# Patient Record
Sex: Male | Born: 1945 | Race: White | Hispanic: No | Marital: Married | Smoking: Never smoker
Health system: Southern US, Community
[De-identification: ages and names within clinical notes are randomized; demographics above are authoritative.]

## PROBLEM LIST (undated history)

## (undated) DIAGNOSIS — E119 Type 2 diabetes mellitus without complications: Secondary | ICD-10-CM

## (undated) DIAGNOSIS — Z87442 Personal history of urinary calculi: Secondary | ICD-10-CM

## (undated) DIAGNOSIS — E785 Hyperlipidemia, unspecified: Secondary | ICD-10-CM

## (undated) DIAGNOSIS — I10 Essential (primary) hypertension: Secondary | ICD-10-CM

## (undated) HISTORY — DX: Personal history of urinary calculi: Z87.442

## (undated) HISTORY — DX: Essential (primary) hypertension: I10

## (undated) HISTORY — PX: KNEE SURGERY: SHX244

## (undated) HISTORY — DX: Type 2 diabetes mellitus without complications: E11.9

## (undated) HISTORY — DX: Hyperlipidemia, unspecified: E78.5

---

## 2005-10-01 ENCOUNTER — Ambulatory Visit: Payer: Self-pay

## 2007-04-11 IMAGING — CT CT STONE STUDY
1 of 2 series · 15 of 32 positions shown, 19 images · non-contrast
Comparison: none

REASON FOR EXAM: LEFT FLANK PAIN
COMMENTS:

[Series 2: soft tissue · axial · 0.71mm/px · z∈[-1442,-1022]mm · 15 of 158 slices shown, 19 images]
[im 12/158  soft-tissue]
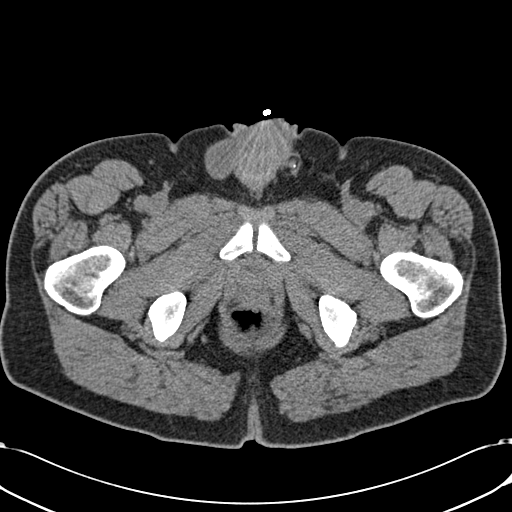
[im 12/158  bone]
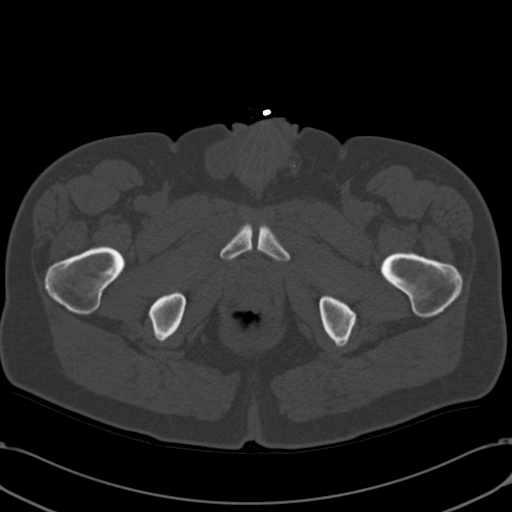
[im 23/158  soft-tissue]
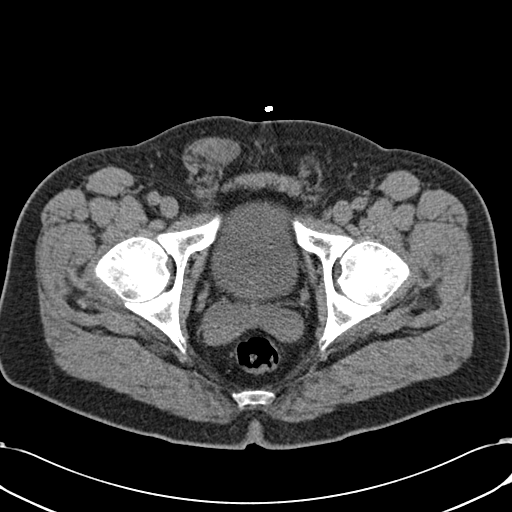
[im 34/158  soft-tissue]
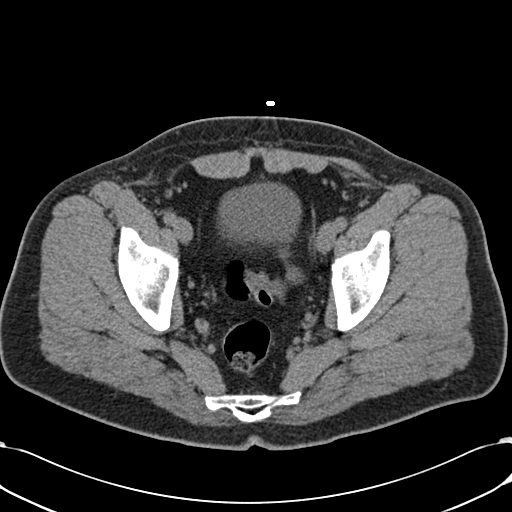
[im 45/158  soft-tissue]
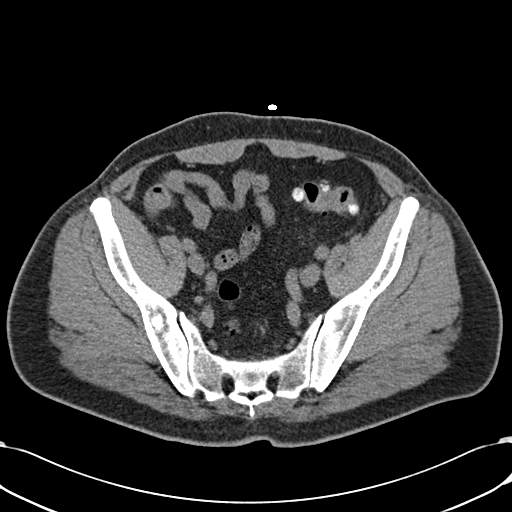
[im 57/158  soft-tissue]
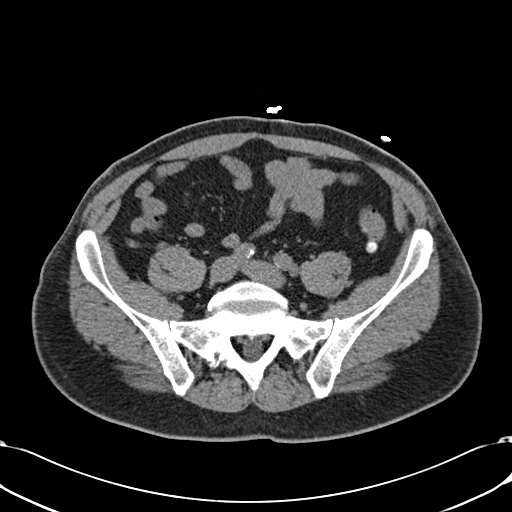
[im 68/158  soft-tissue]
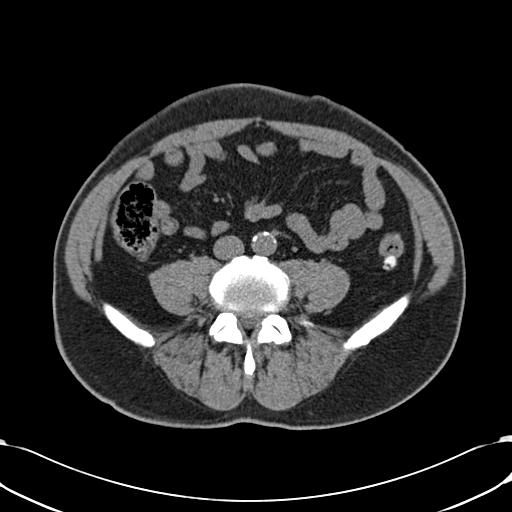
[im 79/158  soft-tissue]
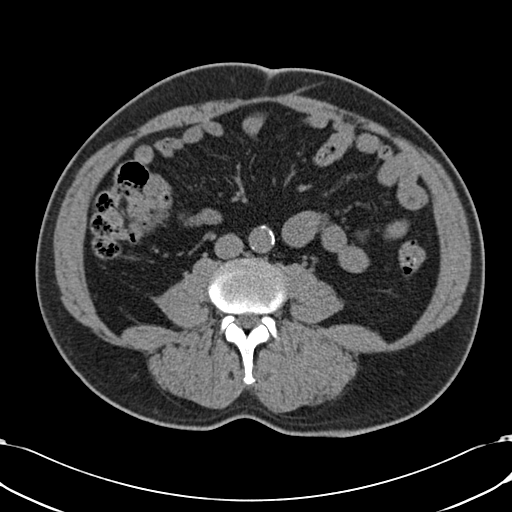
[im 90/158  soft-tissue]
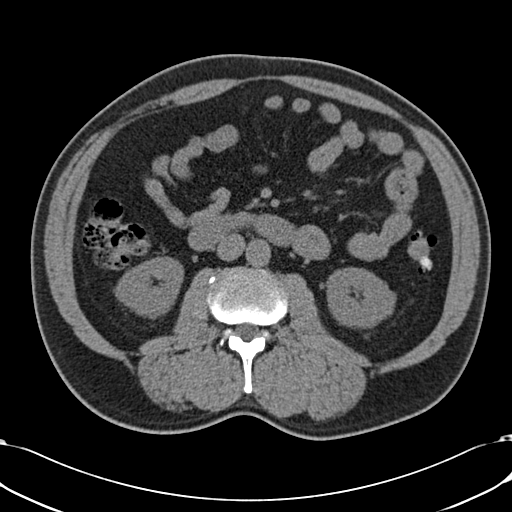
[im 101/158  soft-tissue]
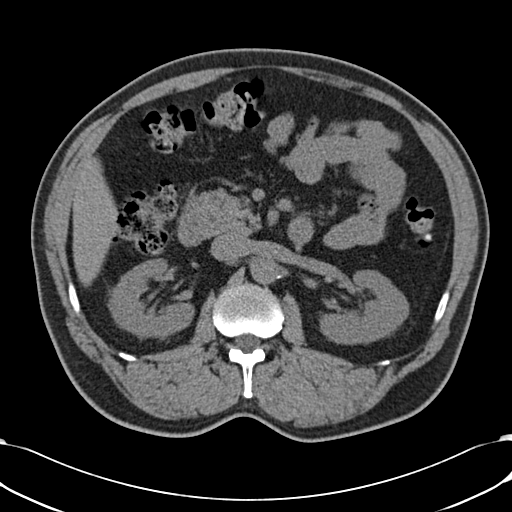
[im 101/158  bone]
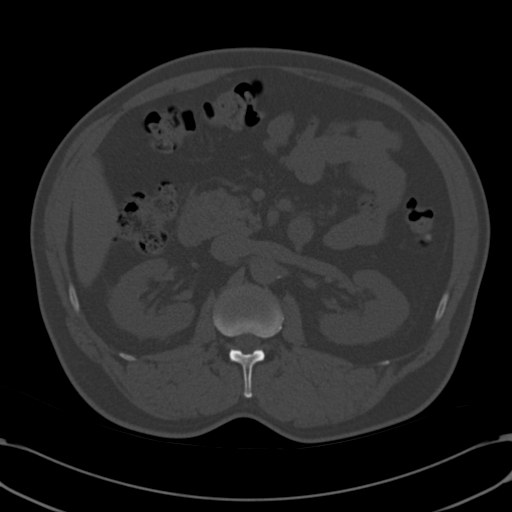
[im 113/158  soft-tissue]
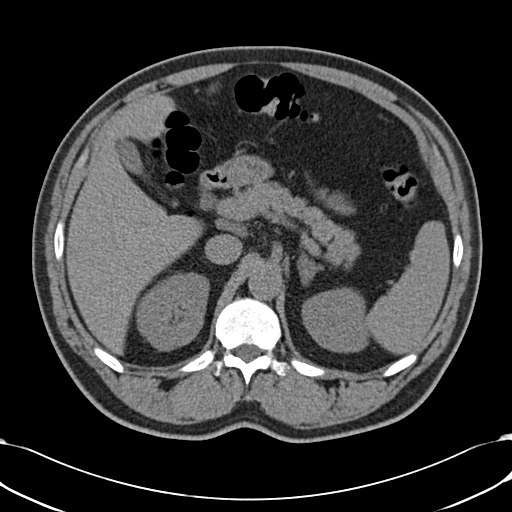
[im 124/158  soft-tissue]
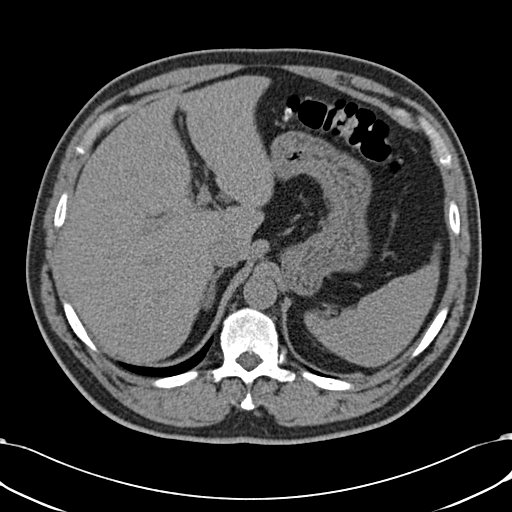
[im 135/158  soft-tissue]
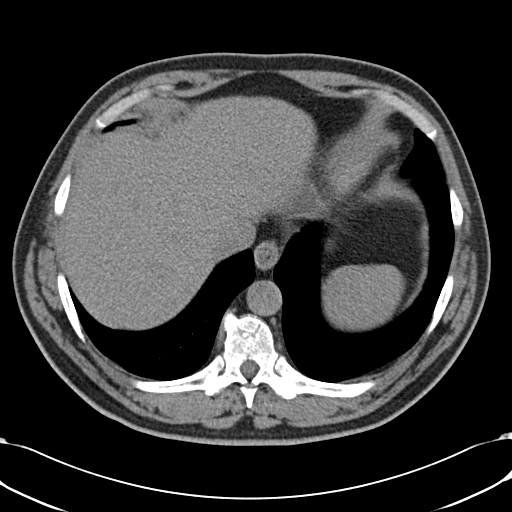
[im 135/158  lung]
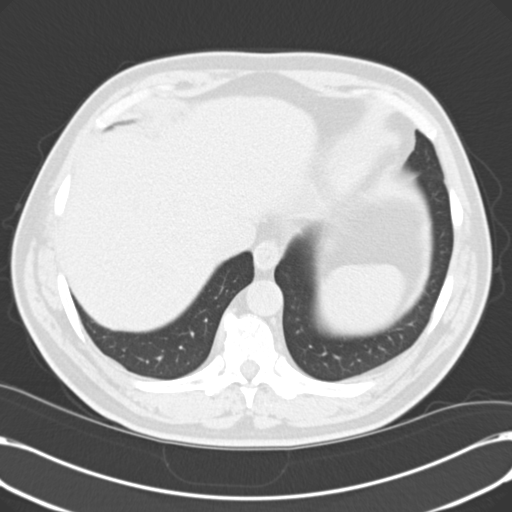
[im 141/158  lung]
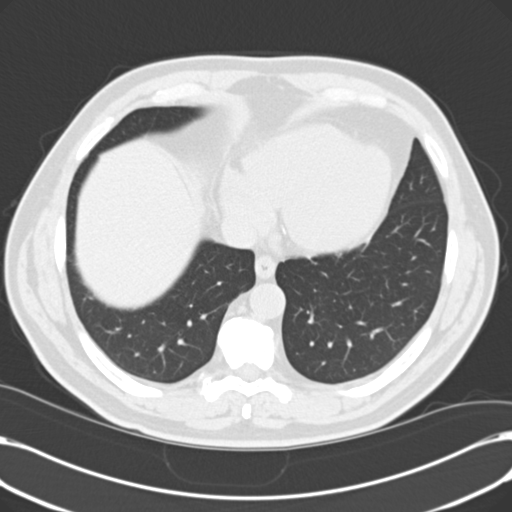
[im 146/158  soft-tissue]
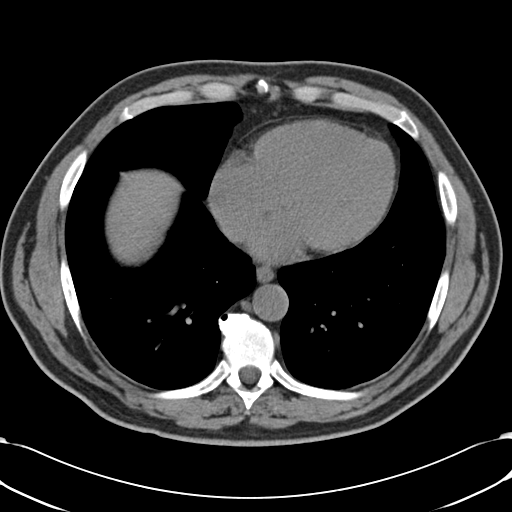
[im 146/158  lung]
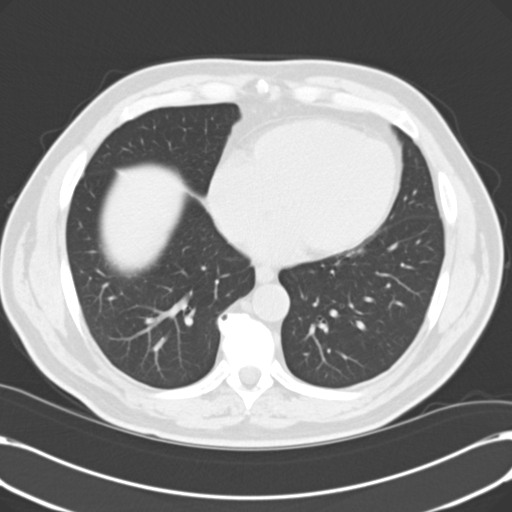
[im 152/158  lung]
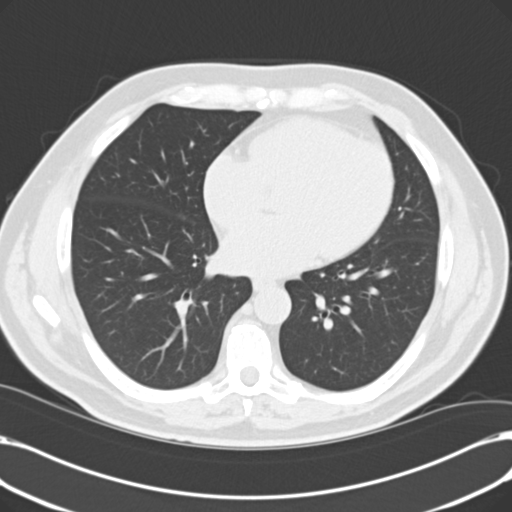

[15 of 32 positions shown; findings below may reference images not displayed]

PROCEDURE:     CT  - CT ABDOMEN /PELVIS WO (STONE)  - October 01, 2005  [DATE]

RESULT:     The patient is complaining of lower back and LEFT flank
discomfort.

The study was performed without oral or intravenous contrast material.

The kidneys are normal in contour.  The perinephric fat is normal in
appearance.  I do not see evidence of hydronephrosis or evidence of
calcified stones.  The ureters are normal in course and caliber.  There is
no evidence of ureteropelvic junction stone.  The urinary bladder is
adequately distended and grossly normal.  The prostate gland produces a
moderate impression upon the urinary bladder base.  There is an inguinal
hernia on the RIGHT containing fat and some fluid but no evidence of bowel
loop is seen.  In fact this may reflect more a hydrocele than a classic
hernia.  There is no free fluid in the pelvis.  There is sigmoid
diverticulosis.  I see no evidence of pelvic lymphadenopathy.  There are
diverticula associated with the descending colon.  There are no findings
suspicious for acute diverticulitis however.

The liver, spleen, nondistended stomach, pancreas, and adrenal glands
exhibit no acute abnormality.  The gallbladder is contracted.  There is no
evidence of ascites.  I see no definite gallstones.  The caliber of the
abdominal aorta is within the limits of normal.  The unopacified loops of
small bowel are normal in appearance.  The lung bases are clear.
IMPRESSION: I do not see evidence of urinary tract stones or urinary tract obstruction
or inflammatory changes.

There is descending colon and rectosigmoid colon diverticulosis without
objective evidence of acute diverticulitis.

There are findings that suggest an inguinal hernia on the RIGHT versus a
hydrocele.

There is no evidence of an abdominal aortic aneurysm or of a retroperitoneal
mass.

I see no more than mild degenerative-type change of the lumbar spine.

## 2010-09-10 ENCOUNTER — Ambulatory Visit: Payer: Self-pay | Admitting: Family Medicine

## 2015-03-28 ENCOUNTER — Other Ambulatory Visit: Payer: Self-pay | Admitting: Family Medicine

## 2015-03-28 ENCOUNTER — Telehealth: Payer: Self-pay

## 2015-03-28 MED ORDER — LANSOPRAZOLE 30 MG PO CPDR: 30.0000 mg | DELAYED_RELEASE_CAPSULE | Freq: Every day | ORAL | Status: DC

## 2015-03-28 MED ORDER — CLORAZEPATE DIPOTASSIUM 7.5 MG PO TABS: 7.5000 mg | ORAL_TABLET | Freq: Two times a day (BID) | ORAL | Status: DC

## 2015-03-28 MED ORDER — ATORVASTATIN CALCIUM 20 MG PO TABS: 20.0000 mg | ORAL_TABLET | Freq: Every day | ORAL | Status: DC

## 2015-03-28 MED ORDER — ATENOLOL 25 MG PO TABS: 25.0000 mg | ORAL_TABLET | Freq: Two times a day (BID) | ORAL | Status: DC

## 2015-03-28 MED ORDER — BENAZEPRIL HCL 40 MG PO TABS: 40.0000 mg | ORAL_TABLET | Freq: Every day | ORAL | Status: DC

## 2015-03-28 MED ORDER — FINASTERIDE 5 MG PO TABS: 5.0000 mg | ORAL_TABLET | Freq: Every day | ORAL | Status: DC

## 2015-03-28 MED ORDER — METFORMIN HCL 500 MG PO TABS: 1000.0000 mg | ORAL_TABLET | Freq: Two times a day (BID) | ORAL | Status: DC

## 2015-03-28 MED ORDER — DOXYCYCLINE HYCLATE 100 MG PO CAPS: 100.0000 mg | ORAL_CAPSULE | Freq: Two times a day (BID) | ORAL | Status: DC

## 2015-03-28 MED ORDER — AMLODIPINE BESYLATE 10 MG PO TABS: 10.0000 mg | ORAL_TABLET | Freq: Every day | ORAL | Status: DC

## 2015-03-28 MED ORDER — TAMSULOSIN HCL 0.4 MG PO CAPS: 0.8000 mg | ORAL_CAPSULE | Freq: Every day | ORAL | Status: DC

## 2015-03-28 NOTE — Telephone Encounter (Signed)
Patient needs all of his medications refilled with [facility] - he will run out before visit

## 2015-03-29 MED ORDER — CLORAZEPATE DIPOTASSIUM 7.5 MG PO TABS: 7.5000 mg | ORAL_TABLET | Freq: Two times a day (BID) | ORAL | Status: DC

## 2015-04-03 ENCOUNTER — Ambulatory Visit (INDEPENDENT_AMBULATORY_CARE_PROVIDER_SITE_OTHER): Payer: Medicare PPO | Admitting: Family Medicine

## 2015-04-03 ENCOUNTER — Encounter: Payer: Self-pay | Admitting: Family Medicine

## 2015-04-03 VITALS — BP 146/67 | HR 52 | Temp 98.6°F | Ht 66.2 in | Wt 187.0 lb

## 2015-04-03 DIAGNOSIS — Z23 Encounter for immunization: Secondary | ICD-10-CM | POA: Diagnosis not present

## 2015-04-03 DIAGNOSIS — E119 Type 2 diabetes mellitus without complications: Secondary | ICD-10-CM | POA: Diagnosis not present

## 2015-04-03 DIAGNOSIS — I10 Essential (primary) hypertension: Secondary | ICD-10-CM | POA: Diagnosis not present

## 2015-04-03 DIAGNOSIS — E785 Hyperlipidemia, unspecified: Secondary | ICD-10-CM | POA: Diagnosis not present

## 2015-04-03 DIAGNOSIS — F419 Anxiety disorder, unspecified: Secondary | ICD-10-CM

## 2015-04-03 MED ORDER — LORAZEPAM 0.5 MG PO TABS: 0.5000 mg | ORAL_TABLET | Freq: Two times a day (BID) | ORAL | Status: DC

## 2015-04-03 NOTE — Progress Notes (Signed)
BP 146/67 mmHg  Pulse 52  Temp(Src) 98.6 F (37 C)  Ht 5' 6.2" (1.681 m)  Wt 187 lb (84.823 kg)  BMI 30.02 kg/m2  SpO2 96%   Subjective:    Patient ID: Russell Byrd, male    DOB: 02-May-1946, 68 y.o.   MRN: [id number]  HPI: Russell Byrd is a 69 y.o. male  Chief Complaint  Patient presents with  . Diabetes   Diabetes doing well no low blood sugar spells home checking showing good control no issues with medications no low blood sugar spells. Taking faithfully without problems.  Blood pressure no issues with medications taken faithfully and good control.  Hypercholesterol doing well with medications no complaints takes faithfully  Chronic anxiety concerned because Tranxene has gotten to be very expensive as wondered about less expensive medications for anxiety.  Relevant past medical, surgical, family and social history reviewed and updated as indicated. Interim medical history since our last visit reviewed. Allergies and medications reviewed and updated.  Review of Systems  Constitutional: Negative.   Respiratory: Negative.   Cardiovascular: Negative.     Per HPI unless specifically indicated above     Objective:    BP 146/67 mmHg  Pulse 52  Temp(Src) 98.6 F (37 C)  Ht 5' 6.2" (1.681 m)  Wt 187 lb (84.823 kg)  BMI 30.02 kg/m2  SpO2 96%  Wt Readings from Last 3 Encounters:  04/03/15 187 lb (84.823 kg)  09/29/14 186 lb (84.369 kg)    Physical Exam  Constitutional: He is oriented to person, place, and time. He appears well-developed and well-nourished. No distress.  HENT:  Head: Normocephalic and atraumatic.  Right Ear: Hearing normal.  Left Ear: Hearing normal.  Nose: Nose normal.  Eyes: Conjunctivae and lids are normal. Right eye exhibits no discharge. Left eye exhibits no discharge. No scleral icterus.  Cardiovascular: Normal rate, regular rhythm and normal heart sounds.   Pulmonary/Chest: Effort normal and breath sounds normal. No respiratory  distress.  Musculoskeletal: Normal range of motion.  Neurological: He is alert and oriented to person, place, and time.  Skin: Skin is intact. No rash noted.  Psychiatric: He has a normal mood and affect. His speech is normal and behavior is normal. Judgment and thought content normal. Cognition and memory are normal.    No results found for this or any previous visit.    Assessment & Plan:   Problem List Items Addressed This Visit      Cardiovascular and Mediastinum   Hypertension    The current medical regimen is effective;  continue present plan and medications.         Endocrine   Diabetes mellitus without complication - Primary    The current medical regimen is effective;  continue present plan and medications.       Relevant Orders   Bayer DCA Hb A1c Waived     Other   Hyperlipidemia    The current medical regimen is effective;  continue present plan and medications.       Chronic anxiety    Patient taking Tranxene which is gotten very expensive will switch to lorezapam 0.5 which is the equivalent dose.      Relevant Medications   LORazepam (ATIVAN) 0.5 MG tablet    Other Visit Diagnoses    Immunization due        Relevant Orders    Flu Vaccine QUAD 36+ mos PF IM (Fluarix & Fluzone Quad PF) (Completed)  Follow up plan: Return in about 6 months (around 10/01/2015), or if symptoms worsen or fail to improve, for Physical Exam.

## 2015-04-03 NOTE — Assessment & Plan Note (Signed)
The current medical regimen is effective;  continue present plan and medications.  

## 2015-04-03 NOTE — Assessment & Plan Note (Signed)
Patient taking Tranxene which is gotten very expensive will switch to lorezapam 0.5 which is the equivalent dose.

## 2015-04-04 LAB — BAYER DCA HB A1C WAIVED: HB A1C (BAYER DCA - WAIVED): 7.5 % — ABNORMAL HIGH (ref <7.0)

## 2015-06-13 ENCOUNTER — Telehealth: Payer: Self-pay

## 2015-06-13 NOTE — Telephone Encounter (Signed)
Patient's Metformin needs to be written for [name] used his refill to send current order, please send new Rx for 360 to get him caught up  I verbally called in his Lorazepam

## 2015-06-14 ENCOUNTER — Other Ambulatory Visit: Payer: Self-pay | Admitting: Family Medicine

## 2015-06-14 MED ORDER — METFORMIN HCL 500 MG PO TABS: 1000.0000 mg | ORAL_TABLET | Freq: Two times a day (BID) | ORAL | Status: DC

## 2015-08-14 LAB — HM DIABETES EYE EXAM

## 2015-08-23 ENCOUNTER — Telehealth: Payer: Self-pay | Admitting: Family Medicine

## 2015-08-23 ENCOUNTER — Other Ambulatory Visit: Payer: Self-pay | Admitting: Family Medicine

## 2015-08-23 MED ORDER — CLORAZEPATE DIPOTASSIUM 7.5 MG PO TABS: 7.5000 mg | ORAL_TABLET | Freq: Two times a day (BID) | ORAL | Status: DC | PRN

## 2015-08-23 NOTE — Telephone Encounter (Signed)
Called in.

## 2015-08-23 NOTE — Telephone Encounter (Signed)
Pt called and stated that he was having BP issues and he would like a call back. @ 3pm it was 206/74 and it was 165/104@2pm . i advised the pt to go to the ED but he stated that he just needed to speak with [facility] to change his medication. Pt would like a call back asap.

## 2015-08-23 NOTE — Progress Notes (Signed)
Discussed with patient anxiety driving blood pressure up Patient had been previously well controlled on Tranxene will restart Tranxene prescription

## 2015-09-21 ENCOUNTER — Other Ambulatory Visit: Payer: Self-pay | Admitting: Family Medicine

## 2015-09-21 MED ORDER — ATENOLOL 25 MG PO TABS: 25.0000 mg | ORAL_TABLET | Freq: Two times a day (BID) | ORAL | Status: DC

## 2015-09-21 MED ORDER — ATORVASTATIN CALCIUM 20 MG PO TABS: 20.0000 mg | ORAL_TABLET | Freq: Every day | ORAL | Status: DC

## 2015-09-21 MED ORDER — BENAZEPRIL HCL 40 MG PO TABS: 40.0000 mg | ORAL_TABLET | Freq: Every day | ORAL | Status: DC

## 2015-09-21 MED ORDER — LANSOPRAZOLE 30 MG PO CPDR: 30.0000 mg | DELAYED_RELEASE_CAPSULE | Freq: Every day | ORAL | Status: DC

## 2015-09-21 MED ORDER — AMLODIPINE BESYLATE 10 MG PO TABS: 10.0000 mg | ORAL_TABLET | Freq: Every day | ORAL | Status: DC

## 2015-09-21 MED ORDER — CLORAZEPATE DIPOTASSIUM 7.5 MG PO TABS: 7.5000 mg | ORAL_TABLET | Freq: Two times a day (BID) | ORAL | Status: DC | PRN

## 2015-09-21 MED ORDER — METFORMIN HCL 500 MG PO TABS: 1000.0000 mg | ORAL_TABLET | Freq: Two times a day (BID) | ORAL | Status: DC

## 2015-10-02 ENCOUNTER — Ambulatory Visit (INDEPENDENT_AMBULATORY_CARE_PROVIDER_SITE_OTHER): Payer: Medicare HMO | Admitting: Family Medicine

## 2015-10-02 ENCOUNTER — Encounter: Payer: Self-pay | Admitting: Family Medicine

## 2015-10-02 VITALS — BP 137/66 | HR 49 | Temp 98.4°F | Ht 65.8 in | Wt 182.0 lb

## 2015-10-02 DIAGNOSIS — F419 Anxiety disorder, unspecified: Secondary | ICD-10-CM

## 2015-10-02 DIAGNOSIS — Z0000 Encounter for general adult medical examination without abnormal findings: Secondary | ICD-10-CM | POA: Diagnosis not present

## 2015-10-02 DIAGNOSIS — E785 Hyperlipidemia, unspecified: Secondary | ICD-10-CM

## 2015-10-02 DIAGNOSIS — I10 Essential (primary) hypertension: Secondary | ICD-10-CM

## 2015-10-02 DIAGNOSIS — E119 Type 2 diabetes mellitus without complications: Secondary | ICD-10-CM

## 2015-10-02 NOTE — Assessment & Plan Note (Addendum)
Discuss poor control of diabetes discuss that is getting worse instead of better A1c has climbed 2/10 of a point since last visit Discuss not on neurologic treatment with diet exercise weight loss patient stated he didn't want to increase medication but discussed risk factor modification is our goal not pill counting Patient on review only taking metformin maximum dose. Discuss again with patient does not want to add another medication will give diet another chance for 3 months patient will work on diet and exercise.

## 2015-10-02 NOTE — Assessment & Plan Note (Signed)
The current medical regimen is effective;  continue present plan and medications.  

## 2015-10-02 NOTE — Progress Notes (Signed)
BP 137/66 mmHg  Pulse 49  Temp(Src) 98.4 F (36.9 C)  Ht 5' 5.8" (1.671 m)  Wt 182 lb (82.555 kg)  BMI 29.57 kg/m2  SpO2 97%   Subjective:    Patient ID: Russell Byrd, male    DOB: 1946/05/31, 70 y.o.   MRN: [id number]  HPI: BORUCH MASCORRO is a 70 y.o. male  Chief Complaint  Patient presents with  . Annual Exam   Patient recheck multiple medical issues diabetes hypertension hypercholesterol taking medications without problems noted low blood sugar spells all in all doing well Takes occasional Duaine Dredge does okay Takes doxycycline for chronic prostatitis Relevant past medical, surgical, family and social history reviewed and updated as indicated. Interim medical history since our last visit reviewed. Allergies and medications reviewed and updated.  Review of Systems  Constitutional: Negative.   HENT: Negative.   Eyes: Negative.   Respiratory: Negative.   Cardiovascular: Negative.   Gastrointestinal: Negative.   Endocrine: Negative.   Genitourinary: Negative.   Musculoskeletal: Negative.   Skin: Negative.   Allergic/Immunologic: Negative.   Neurological: Negative.   Hematological: Negative.   Psychiatric/Behavioral: Negative.     Per HPI unless specifically indicated above     Objective:    BP 137/66 mmHg  Pulse 49  Temp(Src) 98.4 F (36.9 C)  Ht 5' 5.8" (1.671 m)  Wt 182 lb (82.555 kg)  BMI 29.57 kg/m2  SpO2 97%  Wt Readings from Last 3 Encounters:  10/02/15 182 lb (82.555 kg)  04/03/15 187 lb (84.823 kg)  09/29/14 186 lb (84.369 kg)    Physical Exam  Constitutional: He is oriented to person, place, and time. He appears well-developed and well-nourished.  HENT:  Head: Normocephalic and atraumatic.  Right Ear: External ear normal.  Left Ear: External ear normal.  Eyes: Conjunctivae and EOM are normal. Pupils are equal, round, and reactive to light.  Neck: Normal range of motion. Neck supple.  Cardiovascular: Normal rate, regular rhythm,  normal heart sounds and intact distal pulses.   Pulmonary/Chest: Effort normal and breath sounds normal.  Abdominal: Soft. Bowel sounds are normal. There is no splenomegaly or hepatomegaly.  Genitourinary: Rectum normal, prostate normal and penis normal.  Musculoskeletal: Normal range of motion.  Neurological: He is alert and oriented to person, place, and time. He has normal reflexes.  Skin: No rash noted. No erythema.  Psychiatric: He has a normal mood and affect. His behavior is normal. Judgment and thought content normal.    Results for orders placed or performed in visit on 08/15/15  HM DIABETES EYE EXAM  Result Value Ref Range   HM Diabetic Eye Exam No Retinopathy No Retinopathy      Assessment & Plan:   Problem List Items Addressed This Visit      Cardiovascular and Mediastinum   Hypertension - Primary    The current medical regimen is effective;  continue present plan and medications.       Relevant Orders   Comprehensive metabolic panel   Lipid panel   CBC with Differential/Platelet   TSH   Urinalysis, Routine w reflex microscopic (not at [facility])   PSA   Bayer [place] Hb A1c Waived     Endocrine   Diabetes mellitus without complication ([place])    Discuss poor control of diabetes discuss that is getting worse instead of better A1c has climbed 2/10 of a point since last visit Discuss not on neurologic treatment with diet exercise weight loss patient stated he didn't want  to increase medication but discussed risk factor modification is our goal not pill counting Patient on review only taking metformin maximum dose. Discuss again with patient does not want to add another medication will give diet another chance for 3 months patient will work on diet and exercise.       Relevant Orders   Comprehensive metabolic panel   Lipid panel   CBC with Differential/Platelet   TSH   Urinalysis, Routine w reflex microscopic (not at [facility])   PSA   Bayer [place] Hb A1c Waived     Other    Hyperlipidemia    The current medical regimen is effective;  continue present plan and medications.       Relevant Orders   Comprehensive metabolic panel   Lipid panel   CBC with Differential/Platelet   TSH   Urinalysis, Routine w reflex microscopic (not at [facility])   PSA   Bayer [place] Hb A1c Waived   Chronic anxiety   Relevant Orders   Comprehensive metabolic panel   Lipid panel   CBC with Differential/Platelet   TSH   Urinalysis, Routine w reflex microscopic (not at [facility])   PSA   Bayer [place] Hb A1c Waived    Other Visit Diagnoses    PE (physical exam), annual        Relevant Orders    Comprehensive metabolic panel    Lipid panel    CBC with Differential/Platelet    TSH    Urinalysis, Routine w reflex microscopic (not at [facility])    PSA    Bayer [place] Hb A1c Waived        Follow up plan: Return in about 3 months (around 01/02/2016) for a1c.

## 2015-10-03 ENCOUNTER — Telehealth: Payer: Self-pay | Admitting: Family Medicine

## 2015-10-03 ENCOUNTER — Encounter: Payer: Self-pay | Admitting: Family Medicine

## 2015-10-03 LAB — BAYER DCA HB A1C WAIVED: HB A1C: 7.7 % — AB (ref <7.0)

## 2015-10-03 LAB — COMPREHENSIVE METABOLIC PANEL
A/G RATIO: 1.8 (ref 1.2–2.2)
ALT: 21 IU/L (ref 0–44)
AST: 17 IU/L (ref 0–40)
Albumin: 4.5 g/dL (ref 3.5–4.8)
Alkaline Phosphatase: 90 IU/L (ref 39–117)
BUN/Creatinine Ratio: 18 (ref 10–22)
BUN: 17 mg/dL (ref 8–27)
Bilirubin Total: 0.4 mg/dL (ref 0.0–1.2)
CALCIUM: 9.7 mg/dL (ref 8.6–10.2)
CO2: 22 mmol/L (ref 18–29)
CREATININE: 0.95 mg/dL (ref 0.76–1.27)
Chloride: 99 mmol/L (ref 96–106)
GFR calc Af Amer: 93 mL/min/{1.73_m2} (ref >59)
GFR, EST NON AFRICAN AMERICAN: 81 mL/min/{1.73_m2} (ref >59)
GLUCOSE: 171 mg/dL — AB (ref 65–99)
Globulin, Total: 2.5 g/dL (ref 1.5–4.5)
POTASSIUM: 5.3 mmol/L — AB (ref 3.5–5.2)
Sodium: 138 mmol/L (ref 134–144)
TOTAL PROTEIN: 7 g/dL (ref 6.0–8.5)

## 2015-10-03 LAB — LIPID PANEL
CHOL/HDL RATIO: 3.7 ratio (ref 0.0–5.0)
Cholesterol, Total: 122 mg/dL (ref 100–199)
HDL: 33 mg/dL — AB (ref >39)
LDL CALC: 57 mg/dL (ref 0–99)
TRIGLYCERIDES: 158 mg/dL — AB (ref 0–149)
VLDL Cholesterol Cal: 32 mg/dL (ref 5–40)

## 2015-10-03 LAB — URINALYSIS, ROUTINE W REFLEX MICROSCOPIC
Bilirubin, UA: NEGATIVE
GLUCOSE, UA: NEGATIVE
KETONES UA: NEGATIVE
LEUKOCYTES UA: NEGATIVE
NITRITE UA: NEGATIVE
RBC, UA: NEGATIVE
SPEC GRAV UA: 1.02 (ref 1.005–1.030)
Urobilinogen, Ur: 0.2 mg/dL (ref 0.2–1.0)
pH, UA: 5.5 (ref 5.0–7.5)

## 2015-10-03 LAB — CBC WITH DIFFERENTIAL/PLATELET
BASOS ABS: 0 10*3/uL (ref 0.0–0.2)
Basos: 0 %
EOS (ABSOLUTE): 0.1 10*3/uL (ref 0.0–0.4)
Eos: 1 %
HEMOGLOBIN: 14 g/dL (ref 12.6–17.7)
Hematocrit: 40.6 % (ref 37.5–51.0)
Immature Grans (Abs): 0.1 10*3/uL (ref 0.0–0.1)
Immature Granulocytes: 1 %
LYMPHS ABS: 1.9 10*3/uL (ref 0.7–3.1)
Lymphs: 17 %
MCH: 31 pg (ref 26.6–33.0)
MCHC: 34.5 g/dL (ref 31.5–35.7)
MCV: 90 fL (ref 79–97)
MONOCYTES: 8 %
MONOS ABS: 0.9 10*3/uL (ref 0.1–0.9)
NEUTROS ABS: 7.8 10*3/uL — AB (ref 1.4–7.0)
Neutrophils: 73 %
PLATELETS: 215 10*3/uL (ref 150–379)
RBC: 4.52 x10E6/uL (ref 4.14–5.80)
RDW: 13.1 % (ref 12.3–15.4)
WBC: 10.7 10*3/uL (ref 3.4–10.8)

## 2015-10-03 LAB — MICROSCOPIC EXAMINATION: WBC, UA: NONE SEEN /hpf (ref 0–?)

## 2015-10-03 LAB — PSA: Prostate Specific Ag, Serum: 0.3 ng/mL (ref 0.0–4.0)

## 2015-10-03 LAB — TSH: TSH: 3.03 u[IU]/mL (ref 0.450–4.500)

## 2015-10-03 NOTE — Telephone Encounter (Signed)
Pt called stated he is returning [place] and Dr. Rance Muir call. Thanks.

## 2015-11-09 DIAGNOSIS — M25561 Pain in right knee: Secondary | ICD-10-CM | POA: Insufficient documentation

## 2015-12-19 ENCOUNTER — Telehealth: Payer: Self-pay | Admitting: Family Medicine

## 2015-12-19 NOTE — Telephone Encounter (Signed)
Pt called and cancelled appt and rescheduled for 03/25/16 per his demands. Pt stated he WILL NOT come back before this date. Pt needs someone to call him and let him know if he needs to fast and what labs need to be drawn. Thanks.

## 2015-12-19 NOTE — Telephone Encounter (Signed)
Patient states he checks his sugar every day and it is down.  He has had too many doctor's appointments and will not come in until September.  He would like to know what bloodwork you will order in September.

## 2015-12-24 NOTE — Telephone Encounter (Signed)
Expand All Collapse All   Patient states he checks his sugar every day and it is down. He has had too many doctor's appointments and will not come in until September. He would like to know what bloodwork you will order in September.            Russell Byrd at 12/19/2015 3:09 PM     Status: Signed       Expand All Collapse All   Pt called and cancelled appt and rescheduled for 03/25/16 per his demands. Pt stated he WILL NOT come back before this date. Pt needs someone to call him and let him know if he needs to fast and what labs need to be drawn. Thanks.       Discuss what labs to order when seen next. Fasting is not necessary

## 2015-12-25 ENCOUNTER — Other Ambulatory Visit: Payer: Self-pay | Admitting: Family Medicine

## 2015-12-25 ENCOUNTER — Ambulatory Visit: Payer: Medicare HMO | Admitting: Family Medicine

## 2015-12-25 MED ORDER — CLORAZEPATE DIPOTASSIUM 7.5 MG PO TABS: 7.5000 mg | ORAL_TABLET | Freq: Two times a day (BID) | ORAL | Status: DC | PRN

## 2016-01-29 ENCOUNTER — Telehealth: Payer: Self-pay | Admitting: Family Medicine

## 2016-01-29 NOTE — Telephone Encounter (Signed)
Pt called would like a call back from [place] regarding a notice he got to serve on a jury, wants Dr. Jeananne Rama to take him out due to health issues. Thanks.

## 2016-01-29 NOTE — Telephone Encounter (Signed)
Patient very upset about jury summons, insists he could never sit throught it for all of his "health issues" and that he has to go to the bathroom every hour.  He also wants to make sure you have orders in for his labs for his visit 03/25/16

## 2016-01-30 NOTE — Telephone Encounter (Signed)
Called patient, advised letter is in mail to him

## 2016-01-30 NOTE — Telephone Encounter (Signed)
done

## 2016-03-25 ENCOUNTER — Ambulatory Visit (INDEPENDENT_AMBULATORY_CARE_PROVIDER_SITE_OTHER): Payer: Medicare HMO | Admitting: Family Medicine

## 2016-03-25 ENCOUNTER — Encounter: Payer: Self-pay | Admitting: Family Medicine

## 2016-03-25 VITALS — BP 127/67 | HR 51 | Temp 97.6°F | Ht 66.0 in | Wt 180.0 lb

## 2016-03-25 DIAGNOSIS — I10 Essential (primary) hypertension: Secondary | ICD-10-CM

## 2016-03-25 DIAGNOSIS — Z23 Encounter for immunization: Secondary | ICD-10-CM

## 2016-03-25 DIAGNOSIS — E785 Hyperlipidemia, unspecified: Secondary | ICD-10-CM

## 2016-03-25 DIAGNOSIS — E119 Type 2 diabetes mellitus without complications: Secondary | ICD-10-CM | POA: Diagnosis not present

## 2016-03-25 LAB — LP+ALT+AST PICCOLO, WAIVED
ALT (SGPT) Piccolo, Waived: 21 U/L (ref 10–47)
AST (SGOT) PICCOLO, WAIVED: 29 U/L (ref 11–38)
CHOL/HDL RATIO PICCOLO,WAIVE: 3.5 mg/dL
CHOLESTEROL PICCOLO, WAIVED: 116 mg/dL (ref <200)
HDL CHOL PICCOLO, WAIVED: 34 mg/dL — AB (ref >59)
LDL Chol Calc Piccolo Waived: 53 mg/dL (ref <100)
TRIGLYCERIDES PICCOLO,WAIVED: 149 mg/dL (ref <150)
VLDL Chol Calc Piccolo,Waive: 30 mg/dL — ABNORMAL HIGH (ref <30)

## 2016-03-25 LAB — BAYER DCA HB A1C WAIVED: HB A1C (BAYER DCA - WAIVED): 7.1 % — ABNORMAL HIGH (ref <7.0)

## 2016-03-25 MED ORDER — CLORAZEPATE DIPOTASSIUM 7.5 MG PO TABS: 7.5000 mg | ORAL_TABLET | Freq: Two times a day (BID) | ORAL | 1 refills | Status: DC | PRN

## 2016-03-25 NOTE — Assessment & Plan Note (Signed)
The current medical regimen is effective;  continue present plan and medications.  

## 2016-03-25 NOTE — Progress Notes (Signed)
BP 127/67 (BP Location: Left Arm, Patient Position: Sitting, Cuff Size: Normal)   Pulse (!) 51   Temp 97.6 F (36.4 C)   Ht 5\' 6"  (1.676 m) Comment: with shoes  Wt 180 lb (81.6 kg) Comment: with shoes  SpO2 96%   BMI 29.05 kg/m    Subjective:    Patient ID: Russell Byrd, male    DOB: 26-Aug-1945, 70 y.o.   MRN: [id number]  HPI: Russell Byrd is a 70 y.o. male  Chief Complaint  Patient presents with  . Diabetes  . Hyperlipidemia  . Hypertension   Patient follow-up has been doing well noted low blood sugar spells no complaints from medications taking medications faithfully with good control of diabetes cholesterol blood pressure. 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  Relevant past medical, surgical, family and social history reviewed and updated as indicated. Interim medical history since our last visit reviewed. Allergies and medications reviewed and updated.  Review of Systems  Constitutional: Negative.   Respiratory: Negative.   Cardiovascular: Negative.     Per HPI unless specifically indicated above     Objective:    BP 127/67 (BP Location: Left Arm, Patient Position: Sitting, Cuff Size: Normal)   Pulse (!) 51   Temp 97.6 F (36.4 C)   Ht 5\' 6"  (1.676 m) Comment: with shoes  Wt 180 lb (81.6 kg) Comment: with shoes  SpO2 96%   BMI 29.05 kg/m   Wt Readings from Last 3 Encounters:  03/25/16 180 lb (81.6 kg)  10/02/15 182 lb (82.6 kg)  04/03/15 187 lb (84.8 kg)    Physical Exam  Constitutional: He is oriented to person, place, and time. He appears well-developed and well-nourished. No distress.  HENT:  Head: Normocephalic and atraumatic.  Right Ear: Hearing normal.  Left Ear: Hearing normal.  Nose: Nose normal.  Eyes: Conjunctivae and lids are normal. Right eye exhibits no discharge. Left eye exhibits no discharge. No scleral icterus.    Cardiovascular: Normal rate and regular rhythm.   Pulmonary/Chest: Effort normal and breath sounds normal. No respiratory distress.  Musculoskeletal: Normal range of motion.  Neurological: He is alert and oriented to person, place, and time.  Skin: Skin is intact. No rash noted.  Psychiatric: He has a normal mood and affect. His speech is normal and behavior is normal. Judgment and thought content normal. Cognition and memory are normal.    Results for orders placed or performed in visit on 03/25/16  Bayer DCA Hb A1c Waived  Result Value Ref Range   Bayer DCA Hb A1c Waived 7.1 (H) <7.0 %  LP+ALT+AST Piccolo, Waived  Result Value Ref Range   ALT (SGPT) Piccolo, Waived 21 10 - 47 U/L   AST (SGOT) Piccolo, Waived 29 11 - 38 U/L   Cholesterol Piccolo, Waived 116 <200 mg/dL   HDL Chol Piccolo, Waived 34 (L) >59 mg/dL   Triglycerides Piccolo,Waived 149 <150 mg/dL   Chol/HDL Ratio Piccolo,Waive 3.5 mg/dL   LDL Chol Calc Piccolo Waived 53 <100 mg/dL   VLDL Chol Calc Piccolo,Waive 30 (H) <30 mg/dL      Assessment & Plan:   Problem List Items Addressed This Visit      Cardiovascular and Mediastinum   Hypertension    The current medical regimen is effective;  continue present plan and medications.       Relevant Orders   Basic  metabolic panel     Endocrine   Diabetes mellitus without complication ([place]) - Primary    The current medical regimen is effective;  continue present plan and medications.       Relevant Orders   Bayer DCA Hb A1c Waived (Completed)     Other   Hyperlipidemia    The current medical regimen is effective;  continue present plan and medications.       Relevant Orders   LP+ALT+AST Piccol[place], Waived (Completed)    Other Visit Diagnoses    Immunization due       Relevant Orders   Flu vaccine HIGH DOSE PF (Fluzone High dose) (Completed)       Follow up plan: Return in about 6 months (around 09/22/2016) for Physical Exam, Hemoglobin A1c.

## 2016-03-26 ENCOUNTER — Encounter: Payer: Self-pay | Admitting: Family Medicine

## 2016-03-26 LAB — BASIC METABOLIC PANEL
BUN/Creatinine Ratio: 18 (ref 10–24)
BUN: 17 mg/dL (ref 8–27)
CALCIUM: 9.5 mg/dL (ref 8.6–10.2)
CHLORIDE: 101 mmol/L (ref 96–106)
CO2: 24 mmol/L (ref 18–29)
Creatinine, Ser: 0.97 mg/dL (ref 0.76–1.27)
GFR calc Af Amer: 91 mL/min/{1.73_m2} (ref >59)
GFR calc non Af Amer: 79 mL/min/{1.73_m2} (ref >59)
GLUCOSE: 151 mg/dL — AB (ref 65–99)
Potassium: 5.1 mmol/L (ref 3.5–5.2)
Sodium: 139 mmol/L (ref 134–144)

## 2016-04-04 DIAGNOSIS — M1711 Unilateral primary osteoarthritis, right knee: Secondary | ICD-10-CM | POA: Insufficient documentation

## 2016-06-17 ENCOUNTER — Other Ambulatory Visit: Payer: Self-pay | Admitting: Family Medicine

## 2016-06-17 NOTE — Telephone Encounter (Signed)
Waiting for fax from pharmacy.

## 2016-06-18 NOTE — Telephone Encounter (Signed)
Patient would like a refill on his Tranxene.   Was waiting for the fax from pharmacy, haven't received anything so I contacted patient.   Last visit 03/25/2016

## 2016-08-20 ENCOUNTER — Other Ambulatory Visit: Payer: Self-pay | Admitting: Family Medicine

## 2016-08-20 ENCOUNTER — Telehealth: Payer: Self-pay | Admitting: Family Medicine

## 2016-08-20 MED ORDER — AMLODIPINE BESYLATE 10 MG PO TABS: 10.0000 mg | ORAL_TABLET | Freq: Every day | ORAL | 4 refills | Status: DC

## 2016-08-20 MED ORDER — METFORMIN HCL 500 MG PO TABS: 1000.0000 mg | ORAL_TABLET | Freq: Two times a day (BID) | ORAL | 4 refills | Status: DC

## 2016-08-20 MED ORDER — CLORAZEPATE DIPOTASSIUM 7.5 MG PO TABS: 7.5000 mg | ORAL_TABLET | Freq: Two times a day (BID) | ORAL | 1 refills | Status: DC | PRN

## 2016-08-20 MED ORDER — ATORVASTATIN CALCIUM 20 MG PO TABS: 20.0000 mg | ORAL_TABLET | Freq: Every day | ORAL | 4 refills | Status: DC

## 2016-08-20 MED ORDER — ATENOLOL 25 MG PO TABS: 25.0000 mg | ORAL_TABLET | Freq: Two times a day (BID) | ORAL | 4 refills | Status: DC

## 2016-08-20 MED ORDER — LANSOPRAZOLE 30 MG PO CPDR: 30.0000 mg | DELAYED_RELEASE_CAPSULE | Freq: Every day | ORAL | 4 refills | Status: DC

## 2016-08-20 MED ORDER — BENAZEPRIL HCL 40 MG PO TABS: 40.0000 mg | ORAL_TABLET | Freq: Every day | ORAL | 4 refills | Status: DC

## 2016-08-20 NOTE — Telephone Encounter (Signed)
Pt requesting all medication sent to new mail order pharmacy.   Last seen 03/25/16 Next : 10/07/16

## 2016-08-20 NOTE — Telephone Encounter (Signed)
Clorazepate was also refilled, it however, must be faxed to pharmacy as it is controlled.

## 2016-08-20 NOTE — Telephone Encounter (Signed)
Message relayed to patient. Verbalized understanding and denied questions.   

## 2016-08-20 NOTE — Telephone Encounter (Signed)
Patient needs for all of his prescriptions sent to [facility] mail order.  [place] [phone]  Thanks

## 2016-10-07 ENCOUNTER — Encounter: Payer: Self-pay | Admitting: Family Medicine

## 2016-10-07 ENCOUNTER — Ambulatory Visit (INDEPENDENT_AMBULATORY_CARE_PROVIDER_SITE_OTHER): Payer: PPO | Admitting: Family Medicine

## 2016-10-07 VITALS — BP 136/70 | HR 62 | Temp 98.1°F | Ht 65.75 in | Wt 182.7 lb

## 2016-10-07 DIAGNOSIS — Z125 Encounter for screening for malignant neoplasm of prostate: Secondary | ICD-10-CM | POA: Diagnosis not present

## 2016-10-07 DIAGNOSIS — N400 Enlarged prostate without lower urinary tract symptoms: Secondary | ICD-10-CM | POA: Diagnosis not present

## 2016-10-07 DIAGNOSIS — F419 Anxiety disorder, unspecified: Secondary | ICD-10-CM | POA: Diagnosis not present

## 2016-10-07 DIAGNOSIS — I10 Essential (primary) hypertension: Secondary | ICD-10-CM

## 2016-10-07 DIAGNOSIS — E119 Type 2 diabetes mellitus without complications: Secondary | ICD-10-CM | POA: Diagnosis not present

## 2016-10-07 DIAGNOSIS — Z0000 Encounter for general adult medical examination without abnormal findings: Secondary | ICD-10-CM

## 2016-10-07 DIAGNOSIS — E785 Hyperlipidemia, unspecified: Secondary | ICD-10-CM | POA: Diagnosis not present

## 2016-10-07 DIAGNOSIS — Z1329 Encounter for screening for other suspected endocrine disorder: Secondary | ICD-10-CM | POA: Diagnosis not present

## 2016-10-07 DIAGNOSIS — Z1159 Encounter for screening for other viral diseases: Secondary | ICD-10-CM

## 2016-10-07 LAB — HEMOGLOBIN A1C
ESTIMATED AVERAGE GLUCOSE: 151 mg/dL
HEMOGLOBIN A1C: 6.9 % — AB (ref 4.8–5.6)

## 2016-10-07 LAB — MICROSCOPIC EXAMINATION
Bacteria, UA: NONE SEEN
RBC MICROSCOPIC, UA: NONE SEEN /HPF (ref 0–?)
WBC UA: NONE SEEN /HPF (ref 0–?)

## 2016-10-07 LAB — URINALYSIS, ROUTINE W REFLEX MICROSCOPIC
Bilirubin, UA: NEGATIVE
Glucose, UA: NEGATIVE
Ketones, UA: NEGATIVE
Leukocytes, UA: NEGATIVE
NITRITE UA: NEGATIVE
PH UA: 5.5 (ref 5.0–7.5)
RBC, UA: NEGATIVE
Specific Gravity, UA: 1.01 (ref 1.005–1.030)
UUROB: 0.2 mg/dL (ref 0.2–1.0)

## 2016-10-07 NOTE — Progress Notes (Signed)
BP 136/70 (BP Location: Left Arm)   Pulse 62   Temp 98.1 F (36.7 C)   Ht 5' 5.75" (1.67 m)   Wt 182 lb 11.2 oz (82.9 kg)   SpO2 97%   BMI 29.71 kg/m    Subjective:    Patient ID: Russell Byrd, male    DOB: 1945-07-22, 71 y.o.   MRN: [id number]  HPI: Russell Byrd is a 71 y.o. male  Chief Complaint  Patient presents with  . Annual Exam  AWV metrics met Patient all in all doing well no complaints from medications blood pressure good control. No complaints from medications taking faithfully. Cholesterol doing well no complaints from Lipitor. Chronic anxiety taking Tranxene 7.5 twice a day without problems spent taking the same dose for years without issues. No low blood sugar spells taking metformin thousand 2 times daily without any issues. Relevant past medical, surgical, family and social history reviewed and updated as indicated. Interim medical history since our last visit reviewed. Allergies and medications reviewed and updated.  Review of Systems  Constitutional: Negative.   HENT: Negative.   Eyes: Negative.   Respiratory: Negative.   Cardiovascular: Negative.   Gastrointestinal: Negative.   Endocrine: Negative.   Genitourinary: Negative.   Musculoskeletal: Negative.   Skin: Negative.   Allergic/Immunologic: Negative.   Neurological: Negative.   Hematological: Negative.   Psychiatric/Behavioral: Negative.     Per HPI unless specifically indicated above     Objective:    BP 136/70 (BP Location: Left Arm)   Pulse 62   Temp 98.1 F (36.7 C)   Ht 5' 5.75" (1.67 m)   Wt 182 lb 11.2 oz (82.9 kg)   SpO2 97%   BMI 29.71 kg/m   Wt Readings from Last 3 Encounters:  10/07/16 182 lb 11.2 oz (82.9 kg)  03/25/16 180 lb (81.6 kg)  10/02/15 182 lb (82.6 kg)    Physical Exam  Constitutional: He is oriented to person, place, and time. He appears well-developed and well-nourished.  HENT:  Head: Normocephalic and atraumatic.  Right Ear: External ear  normal.  Left Ear: External ear normal.  Eyes: Conjunctivae and EOM are normal. Pupils are equal, round, and reactive to light.  Neck: Normal range of motion. Neck supple.  Cardiovascular: Normal rate, regular rhythm, normal heart sounds and intact distal pulses.   Pulmonary/Chest: Effort normal and breath sounds normal.  Abdominal: Soft. Bowel sounds are normal. There is no splenomegaly or hepatomegaly.  Genitourinary: Rectum normal and penis normal.  Genitourinary Comments: bph changes   Musculoskeletal: Normal range of motion.  Neurological: He is alert and oriented to person, place, and time. He has normal reflexes.  Skin: No rash noted. No erythema.  Psychiatric: He has a normal mood and affect. His behavior is normal. Judgment and thought content normal.    Results for orders placed or performed in visit on 03/25/16  Bayer DCA Hb A1c Waived  Result Value Ref Range   Bayer DCA Hb A1c Waived 7.1 (H) <7.0 %  LP+ALT+AST Piccolo, Waived  Result Value Ref Range   ALT (SGPT) Piccolo, Waived 21 10 - 47 U/L   AST (SGOT) Piccolo, Waived 29 11 - 38 U/L   Cholesterol Piccolo, Waived 116 <200 mg/dL   HDL Chol Piccolo, Waived 34 (L) >59 mg/dL   Triglycerides Piccolo,Waived 149 <150 mg/dL   Chol/HDL Ratio Piccolo,Waive 3.5 mg/dL   LDL Chol Calc Piccolo Waived 53 <100 mg/dL   VLDL Chol Calc Piccolo,Waive 30 (H) <  30 mg/dL  Basic metabolic panel  Result Value Ref Range   Glucose 151 (H) 65 - 99 mg/dL   BUN 17 8 - 27 mg/dL   Creatinine, Ser 0.97 0.76 - 1.27 mg/dL   GFR calc non Af Amer 79 >59 mL/min/1.73   GFR calc Af Amer 91 >59 mL/min/1.73   BUN/Creatinine Ratio 18 10 - 24   Sodium 139 134 - 144 mmol/L   Potassium 5.1 3.5 - 5.2 mmol/L   Chloride 101 96 - 106 mmol/L   CO2 24 18 - 29 mmol/L   Calcium 9.5 8.6 - 10.2 mg/dL      Assessment & Plan:   Problem List Items Addressed This Visit      Cardiovascular and Mediastinum   Hypertension    The current medical regimen is effective;   continue present plan and medications.       Relevant Orders   Hemoglobin A1c   CBC with Differential/Platelet   Lipid panel   Comprehensive metabolic panel   Urinalysis, Routine w reflex microscopic     Endocrine   Diabetes mellitus without complication ([place])    The current medical regimen is effective;  continue present plan and medications.       Relevant Orders   Hemoglobin A1c   CBC with Differential/Platelet   Lipid panel   Comprehensive metabolic panel   Urinalysis, Routine w reflex microscopic     Genitourinary   BPH (benign prostatic hyperplasia)    The current medical regimen is effective;  continue present plan and medications.         Other   Hyperlipidemia    The current medical regimen is effective;  continue present plan and medications.       Relevant Orders   Hemoglobin A1c   CBC with Differential/Platelet   Lipid panel   Comprehensive metabolic panel   Urinalysis, Routine w reflex microscopic   Chronic anxiety    The current medical regimen is effective;  continue present plan and medications.        Other Visit Diagnoses    Annual physical exam    -  Primary   Relevant Orders   Hemoglobin A1c   Hepatitis C antibody   CBC with Differential/Platelet   Lipid panel   Comprehensive metabolic panel   PSA   TSH   Urinalysis, Routine w reflex microscopic   Need for hepatitis C screening test       Relevant Orders   Hepatitis C antibody   Prostate cancer screening       Relevant Orders   PSA   Thyroid disorder screen       Relevant Orders   TSH       Follow up plan: Return for Hemoglobin A1c, BMP,  Lipids, ALT, AST.

## 2016-10-07 NOTE — Assessment & Plan Note (Signed)
The current medical regimen is effective;  continue present plan and medications.  

## 2016-10-08 ENCOUNTER — Encounter: Payer: Self-pay | Admitting: Family Medicine

## 2016-10-08 ENCOUNTER — Telehealth: Payer: Self-pay | Admitting: Family Medicine

## 2016-10-08 DIAGNOSIS — E875 Hyperkalemia: Secondary | ICD-10-CM

## 2016-10-08 DIAGNOSIS — D72829 Elevated white blood cell count, unspecified: Secondary | ICD-10-CM

## 2016-10-08 LAB — CBC WITH DIFFERENTIAL/PLATELET
BASOS: 0 %
Basophils Absolute: 0 10*3/uL (ref 0.0–0.2)
EOS (ABSOLUTE): 0.1 10*3/uL (ref 0.0–0.4)
EOS: 1 %
HEMATOCRIT: 41.4 % (ref 37.5–51.0)
HEMOGLOBIN: 14.1 g/dL (ref 13.0–17.7)
IMMATURE GRANS (ABS): 0.1 10*3/uL (ref 0.0–0.1)
Immature Granulocytes: 1 %
LYMPHS ABS: 1.8 10*3/uL (ref 0.7–3.1)
LYMPHS: 15 %
MCH: 31.2 pg (ref 26.6–33.0)
MCHC: 34.1 g/dL (ref 31.5–35.7)
MCV: 92 fL (ref 79–97)
MONOCYTES: 7 %
Monocytes Absolute: 0.9 10*3/uL (ref 0.1–0.9)
NEUTROS ABS: 8.9 10*3/uL — AB (ref 1.4–7.0)
Neutrophils: 76 %
Platelets: 216 10*3/uL (ref 150–379)
RBC: 4.52 x10E6/uL (ref 4.14–5.80)
RDW: 13.3 % (ref 12.3–15.4)
WBC: 11.7 10*3/uL — ABNORMAL HIGH (ref 3.4–10.8)

## 2016-10-08 LAB — COMPREHENSIVE METABOLIC PANEL
A/G RATIO: 1.6 (ref 1.2–2.2)
ALK PHOS: 91 IU/L (ref 39–117)
ALT: 21 IU/L (ref 0–44)
AST: 17 IU/L (ref 0–40)
Albumin: 4.6 g/dL (ref 3.5–4.8)
BUN/Creatinine Ratio: 15 (ref 10–24)
BUN: 15 mg/dL (ref 8–27)
Bilirubin Total: 0.4 mg/dL (ref 0.0–1.2)
CALCIUM: 10 mg/dL (ref 8.6–10.2)
CO2: 25 mmol/L (ref 18–29)
CREATININE: 1.03 mg/dL (ref 0.76–1.27)
Chloride: 96 mmol/L (ref 96–106)
GFR calc Af Amer: 84 mL/min/{1.73_m2} (ref >59)
GFR, EST NON AFRICAN AMERICAN: 73 mL/min/{1.73_m2} (ref >59)
GLOBULIN, TOTAL: 2.8 g/dL (ref 1.5–4.5)
Glucose: 153 mg/dL — ABNORMAL HIGH (ref 65–99)
POTASSIUM: 5.6 mmol/L — AB (ref 3.5–5.2)
SODIUM: 137 mmol/L (ref 134–144)
Total Protein: 7.4 g/dL (ref 6.0–8.5)

## 2016-10-08 LAB — LIPID PANEL
CHOLESTEROL TOTAL: 125 mg/dL (ref 100–199)
Chol/HDL Ratio: 3.7 ratio (ref 0.0–5.0)
HDL: 34 mg/dL — ABNORMAL LOW (ref >39)
LDL CALC: 61 mg/dL (ref 0–99)
Triglycerides: 151 mg/dL — ABNORMAL HIGH (ref 0–149)
VLDL CHOLESTEROL CAL: 30 mg/dL (ref 5–40)

## 2016-10-08 LAB — PSA: PROSTATE SPECIFIC AG, SERUM: 0.3 ng/mL (ref 0.0–4.0)

## 2016-10-08 LAB — HEPATITIS C ANTIBODY

## 2016-10-08 LAB — TSH: TSH: 3.36 u[IU]/mL (ref 0.450–4.500)

## 2016-10-08 NOTE — Telephone Encounter (Signed)
Phone call Discussed with patient potassium slightly elevated and WBC elevated. Patient with some slight infection type symptoms. Discuss Will repeat CBC and BMP in a month or so.

## 2017-01-01 ENCOUNTER — Other Ambulatory Visit: Payer: Self-pay | Admitting: Family Medicine

## 2017-01-01 DIAGNOSIS — E119 Type 2 diabetes mellitus without complications: Secondary | ICD-10-CM

## 2017-01-01 MED ORDER — BLOOD GLUCOSE MONITOR KIT: PACK | 12 refills | Status: DC

## 2017-01-01 NOTE — Telephone Encounter (Signed)
Patient is out of his diabetic medical supplies for meter and test strips. Patient's supplies are out of date.     Insurance will pay for a kit of one touch meter, 100 test strips, 100 lancets.   [facility] in [place]  Please Advise.  Thank you

## 2017-01-07 DIAGNOSIS — R1909 Other intra-abdominal and pelvic swelling, mass and lump: Secondary | ICD-10-CM | POA: Diagnosis not present

## 2017-01-07 DIAGNOSIS — R1011 Right upper quadrant pain: Secondary | ICD-10-CM | POA: Diagnosis not present

## 2017-01-07 DIAGNOSIS — R109 Unspecified abdominal pain: Secondary | ICD-10-CM | POA: Diagnosis not present

## 2017-01-07 DIAGNOSIS — R10811 Right upper quadrant abdominal tenderness: Secondary | ICD-10-CM | POA: Diagnosis not present

## 2017-01-07 DIAGNOSIS — Z79899 Other long term (current) drug therapy: Secondary | ICD-10-CM | POA: Diagnosis not present

## 2017-01-07 DIAGNOSIS — E119 Type 2 diabetes mellitus without complications: Secondary | ICD-10-CM | POA: Diagnosis not present

## 2017-01-07 DIAGNOSIS — I10 Essential (primary) hypertension: Secondary | ICD-10-CM | POA: Diagnosis not present

## 2017-01-07 DIAGNOSIS — Z7984 Long term (current) use of oral hypoglycemic drugs: Secondary | ICD-10-CM | POA: Diagnosis not present

## 2017-01-08 DIAGNOSIS — R1909 Other intra-abdominal and pelvic swelling, mass and lump: Secondary | ICD-10-CM | POA: Diagnosis not present

## 2017-02-17 ENCOUNTER — Other Ambulatory Visit: Payer: Self-pay | Admitting: Family Medicine

## 2017-02-18 MED ORDER — CLORAZEPATE DIPOTASSIUM 7.5 MG PO TABS: 7.5000 mg | ORAL_TABLET | Freq: Two times a day (BID) | ORAL | 1 refills | Status: DC | PRN

## 2017-02-18 NOTE — Telephone Encounter (Signed)
Message left to inform pt that rx was faxed.

## 2017-02-25 ENCOUNTER — Telehealth: Payer: Self-pay | Admitting: Family Medicine

## 2017-02-25 NOTE — Telephone Encounter (Signed)
Patient still having trouble getting his prescription (Clorazepate Dipotassium tablets 7.5 mg)  from the pharmacy. Pharmacy still informing patient that provider needs to fax approval for medication over. Patient told pharmacy that medication was faxed in but is unsure of what is going on. Patient asked if someone could get in touch with the pharmacy due to him being unsure if he has ben calling the right numbers.   Patient gets prescription from [facility]:  Phone number: [phone] Fax Number: [phone]  Patient would also like to see if he could get an extra refill set for the next date he is due som how.  Patient would like a call and if no answer a message from [facility] or provider letting him know that the medication was faxed.   Please Advise.  Thank you

## 2017-02-26 NOTE — Telephone Encounter (Signed)
RX sent to [facility]. Had been sent to another mail order.

## 2017-04-14 ENCOUNTER — Ambulatory Visit (INDEPENDENT_AMBULATORY_CARE_PROVIDER_SITE_OTHER): Payer: PPO | Admitting: Family Medicine

## 2017-04-14 ENCOUNTER — Encounter: Payer: Self-pay | Admitting: Family Medicine

## 2017-04-14 VITALS — BP 138/70 | HR 53 | Wt 180.0 lb

## 2017-04-14 DIAGNOSIS — F419 Anxiety disorder, unspecified: Secondary | ICD-10-CM

## 2017-04-14 DIAGNOSIS — E785 Hyperlipidemia, unspecified: Secondary | ICD-10-CM

## 2017-04-14 DIAGNOSIS — I10 Essential (primary) hypertension: Secondary | ICD-10-CM

## 2017-04-14 DIAGNOSIS — E119 Type 2 diabetes mellitus without complications: Secondary | ICD-10-CM

## 2017-04-14 DIAGNOSIS — Z23 Encounter for immunization: Secondary | ICD-10-CM | POA: Diagnosis not present

## 2017-04-14 NOTE — Assessment & Plan Note (Signed)
The current medical regimen is effective;  continue present plan and medications.  

## 2017-04-14 NOTE — Progress Notes (Signed)
BP 138/70   Pulse (!) 53   Wt 180 lb (81.6 kg)   SpO2 98%   BMI 29.27 kg/m    Subjective:    Patient ID: Russell Byrd, male    DOB: 15-Jan-1946, 71 y.o.   MRN: [id number]  HPI: Russell Byrd is a 71 y.o. male  Chief Complaint  Patient presents with  . Follow-up  Patient doing well blood sugars at home in the 160s indicated doing good  A1c of 6.2.  Patient with no low blood sugar spells or issues taking medications faithfully.  Relevant past medical, surgical, family and social history reviewed and updated as indicated. Interim medical history since our last visit reviewed. Allergies and medications reviewed and updated.  Review of Systems  Constitutional: Negative.   Respiratory: Negative.   Cardiovascular: Negative.     Per HPI unless specifically indicated above     Objective:    BP 138/70   Pulse (!) 53   Wt 180 lb (81.6 kg)   SpO2 98%   BMI 29.27 kg/m   Wt Readings from Last 3 Encounters:  04/14/17 180 lb (81.6 kg)  10/07/16 182 lb 11.2 oz (82.9 kg)  03/25/16 180 lb (81.6 kg)    Physical Exam  Constitutional: He is oriented to person, place, and time. He appears well-developed and well-nourished.  HENT:  Head: Normocephalic and atraumatic.  Eyes: Conjunctivae and EOM are normal.  Neck: Normal range of motion.  Cardiovascular: Normal rate, regular rhythm and normal heart sounds.   Pulmonary/Chest: Effort normal and breath sounds normal.  Musculoskeletal: Normal range of motion.  Neurological: He is alert and oriented to person, place, and time.  Skin: No erythema.  Psychiatric: He has a normal mood and affect. His behavior is normal. Judgment and thought content normal.    Results for orders placed or performed in visit on 10/07/16  Microscopic Examination  Result Value Ref Range   WBC, UA None seen 0 - 5 /hpf   RBC, UA None seen 0 - 2 /hpf   Epithelial Cells (non renal) CANCELED    Bacteria, UA None seen None seen/Few  Hemoglobin A1c    Result Value Ref Range   Hgb A1c MFr Bld 6.9 (H) 4.8 - 5.6 %   Est. average glucose Bld gHb Est-mCnc 151 mg/dL  Hepatitis C antibody  Result Value Ref Range   Hep C Virus Ab <0.1 0.0 - 0.9 s/co ratio  CBC with Differential/Platelet  Result Value Ref Range   WBC 11.7 (H) 3.4 - 10.8 x10E3/uL   RBC 4.52 4.14 - 5.80 x10E6/uL   Hemoglobin 14.1 13.0 - 17.7 g/dL   Hematocrit 41.4 37.5 - 51.0 %   MCV 92 79 - 97 fL   MCH 31.2 26.6 - 33.0 pg   MCHC 34.1 31.5 - 35.7 g/dL   RDW 13.3 12.3 - 15.4 %   Platelets 216 150 - 379 x10E3/uL   Neutrophils 76 Not Estab. %   Lymphs 15 Not Estab. %   Monocytes 7 Not Estab. %   Eos 1 Not Estab. %   Basos 0 Not Estab. %   Neutrophils Absolute 8.9 (H) 1.4 - 7.0 x10E3/uL   Lymphocytes Absolute 1.8 0.7 - 3.1 x10E3/uL   Monocytes Absolute 0.9 0.1 - 0.9 x10E3/uL   EOS (ABSOLUTE) 0.1 0.0 - 0.4 x10E3/uL   Basophils Absolute 0.0 0.0 - 0.2 x10E3/uL   Immature Granulocytes 1 Not Estab. %   Immature Grans (Abs) 0.1 0.0 - 0.1  x10E3/uL  Lipid panel  Result Value Ref Range   Cholesterol, Total 125 100 - 199 mg/dL   Triglycerides 151 (H) 0 - 149 mg/dL   HDL 34 (L) >39 mg/dL   VLDL Cholesterol Cal 30 5 - 40 mg/dL   LDL Calculated 61 0 - 99 mg/dL   Chol/HDL Ratio 3.7 0.0 - 5.0 ratio  Comprehensive metabolic panel  Result Value Ref Range   Glucose 153 (H) 65 - 99 mg/dL   BUN 15 8 - 27 mg/dL   Creatinine, Ser 1.03 0.76 - 1.27 mg/dL   GFR calc non Af Amer 73 >59 mL/min/1.73   GFR calc Af Amer 84 >59 mL/min/1.73   BUN/Creatinine Ratio 15 10 - 24   Sodium 137 134 - 144 mmol/L   Potassium 5.6 (H) 3.5 - 5.2 mmol/L   Chloride 96 96 - 106 mmol/L   CO2 25 18 - 29 mmol/L   Calcium 10.0 8.6 - 10.2 mg/dL   Total Protein 7.4 6.0 - 8.5 g/dL   Albumin 4.6 3.5 - 4.8 g/dL   Globulin, Total 2.8 1.5 - 4.5 g/dL   Albumin/Globulin Ratio 1.6 1.2 - 2.2   Bilirubin Total 0.4 0.0 - 1.2 mg/dL   Alkaline Phosphatase 91 39 - 117 IU/L   AST 17 0 - 40 IU/L   ALT 21 0 - 44 IU/L  PSA   Result Value Ref Range   Prostate Specific Ag, Serum 0.3 0.0 - 4.0 ng/mL  TSH  Result Value Ref Range   TSH 3.360 0.450 - 4.500 uIU/mL  Urinalysis, Routine w reflex microscopic  Result Value Ref Range   Specific Gravity, UA 1.010 1.005 - 1.030   pH, UA 5.5 5.0 - 7.5   Color, UA Yellow Yellow   Appearance Ur Clear Clear   Leukocytes, UA Negative Negative   Protein, UA 1+ (A) Negative/Trace   Glucose, UA Negative Negative   Ketones, UA Negative Negative   RBC, UA Negative Negative   Bilirubin, UA Negative Negative   Urobilinogen, Ur 0.2 0.2 - 1.0 mg/dL   Nitrite, UA Negative Negative   Microscopic Examination See below:       Assessment & Plan:   Problem List Items Addressed This Visit      Cardiovascular and Mediastinum   Hypertension - Primary    The current medical regimen is effective;  continue present plan and medications.       Relevant Orders   Bayer DCA Hb A1c Waived   LP+ALT+AST Piccolo, Waived   Basic metabolic panel     Endocrine   Diabetes mellitus without complication ([place])    The current medical regimen is effective;  continue present plan and medications.       Relevant Orders   Bayer DCA Hb A1c Waived   LP+ALT+AST Piccolo, Waived   Basic metabolic panel     Other   Hyperlipidemia    The current medical regimen is effective;  continue present plan and medications.       Relevant Orders   Bayer DCA Hb A1c Waived   LP+ALT+AST Piccolo, Waived   Basic metabolic panel   Chronic anxiety    The current medical regimen is effective;  continue present plan and medications.        Other Visit Diagnoses    Needs flu shot       Relevant Orders   Flu vaccine HIGH DOSE PF (Fluzone High dose) (Completed)       Follow up plan: Return in  about 6 months (around 10/13/2017) for Physical Exam, Hemoglobin A1c.

## 2017-04-15 ENCOUNTER — Encounter: Payer: Self-pay | Admitting: Family Medicine

## 2017-04-15 LAB — BAYER DCA HB A1C WAIVED: HB A1C (BAYER DCA - WAIVED): 6.2 % (ref <7.0)

## 2017-04-15 LAB — BASIC METABOLIC PANEL
BUN/Creatinine Ratio: 13 (ref 10–24)
BUN: 13 mg/dL (ref 8–27)
CO2: 27 mmol/L (ref 20–29)
CREATININE: 0.99 mg/dL (ref 0.76–1.27)
Calcium: 9.7 mg/dL (ref 8.6–10.2)
Chloride: 101 mmol/L (ref 96–106)
GFR calc Af Amer: 88 mL/min/{1.73_m2} (ref >59)
GFR calc non Af Amer: 76 mL/min/{1.73_m2} (ref >59)
GLUCOSE: 153 mg/dL — AB (ref 65–99)
Potassium: 5 mmol/L (ref 3.5–5.2)
SODIUM: 138 mmol/L (ref 134–144)

## 2017-04-15 LAB — LP+ALT+AST PICCOLO, WAIVED
ALT (SGPT) PICCOLO, WAIVED: 17 U/L (ref 10–47)
AST (SGOT) PICCOLO, WAIVED: 25 U/L (ref 11–38)
Chol/HDL Ratio Piccolo,Waive: 3.8 mg/dL
Cholesterol Piccolo, Waived: 125 mg/dL (ref <200)
HDL Chol Piccolo, Waived: 33 mg/dL — ABNORMAL LOW (ref >59)
LDL Chol Calc Piccolo Waived: 59 mg/dL (ref <100)
Triglycerides Piccolo,Waived: 163 mg/dL — ABNORMAL HIGH (ref <150)
VLDL Chol Calc Piccolo,Waive: 33 mg/dL — ABNORMAL HIGH (ref <30)

## 2017-07-15 DIAGNOSIS — E785 Hyperlipidemia, unspecified: Secondary | ICD-10-CM | POA: Insufficient documentation

## 2017-07-15 DIAGNOSIS — M1712 Unilateral primary osteoarthritis, left knee: Secondary | ICD-10-CM | POA: Diagnosis not present

## 2017-07-15 DIAGNOSIS — E1159 Type 2 diabetes mellitus with other circulatory complications: Secondary | ICD-10-CM | POA: Insufficient documentation

## 2017-07-15 DIAGNOSIS — E119 Type 2 diabetes mellitus without complications: Secondary | ICD-10-CM | POA: Insufficient documentation

## 2017-07-15 DIAGNOSIS — I10 Essential (primary) hypertension: Secondary | ICD-10-CM | POA: Insufficient documentation

## 2017-07-15 DIAGNOSIS — M1711 Unilateral primary osteoarthritis, right knee: Secondary | ICD-10-CM | POA: Diagnosis not present

## 2017-07-15 DIAGNOSIS — M25562 Pain in left knee: Secondary | ICD-10-CM | POA: Diagnosis not present

## 2017-07-15 DIAGNOSIS — E1169 Type 2 diabetes mellitus with other specified complication: Secondary | ICD-10-CM | POA: Insufficient documentation

## 2017-08-11 ENCOUNTER — Telehealth: Payer: Self-pay | Admitting: Family Medicine

## 2017-08-11 NOTE — Telephone Encounter (Signed)
Copied from [place] [id number]. Topic: Quick Communication - Rx Refill/Question >> Aug 11, 2017  4:22 PM Ahmed Prima L wrote: Medication:  clorazepate (TRANXENE) 7.5 MG tablet  (would like 90 day supply)  Has the patient contacted their pharmacy? yes   (Agent: If no, request that the patient contact the pharmacy for the refill.)  Preferred Pharmacy (with phone number or street name):  [facility] [place], [place]  Agent: Please be advised that RX refills may take up to 3 business days. We ask that you follow-up with your pharmacy.

## 2017-08-12 MED ORDER — CLORAZEPATE DIPOTASSIUM 7.5 MG PO TABS: 7.5000 mg | ORAL_TABLET | Freq: Two times a day (BID) | ORAL | 1 refills | Status: DC | PRN

## 2017-09-25 ENCOUNTER — Telehealth: Payer: Self-pay | Admitting: Family Medicine

## 2017-09-25 NOTE — Telephone Encounter (Signed)
Copied from [place] [phone]. Topic: Medicare AWV >> Sep 25, 2017  8:50 AM Leo Rod wrote: Called to schedule AWV with Nurse Health Advisor. Last AWV on 3/27 /17 please schedule AWV with [facility] any date.  Thank you! For questions please contact: Jill Alexanders [phone]  Skype [email or web address]

## 2017-10-13 ENCOUNTER — Ambulatory Visit (INDEPENDENT_AMBULATORY_CARE_PROVIDER_SITE_OTHER): Payer: PPO | Admitting: Family Medicine

## 2017-10-13 ENCOUNTER — Encounter: Payer: Self-pay | Admitting: Family Medicine

## 2017-10-13 VITALS — BP 138/60 | HR 74 | Ht 66.0 in | Wt 182.0 lb

## 2017-10-13 DIAGNOSIS — Z125 Encounter for screening for malignant neoplasm of prostate: Secondary | ICD-10-CM | POA: Diagnosis not present

## 2017-10-13 DIAGNOSIS — Z7189 Other specified counseling: Secondary | ICD-10-CM

## 2017-10-13 DIAGNOSIS — E119 Type 2 diabetes mellitus without complications: Secondary | ICD-10-CM | POA: Diagnosis not present

## 2017-10-13 DIAGNOSIS — E785 Hyperlipidemia, unspecified: Secondary | ICD-10-CM

## 2017-10-13 DIAGNOSIS — Z1329 Encounter for screening for other suspected endocrine disorder: Secondary | ICD-10-CM

## 2017-10-13 DIAGNOSIS — I10 Essential (primary) hypertension: Secondary | ICD-10-CM

## 2017-10-13 DIAGNOSIS — F419 Anxiety disorder, unspecified: Secondary | ICD-10-CM | POA: Diagnosis not present

## 2017-10-13 DIAGNOSIS — N400 Enlarged prostate without lower urinary tract symptoms: Secondary | ICD-10-CM | POA: Diagnosis not present

## 2017-10-13 MED ORDER — METFORMIN HCL 500 MG PO TABS: 1000.0000 mg | ORAL_TABLET | Freq: Two times a day (BID) | ORAL | 4 refills | Status: DC

## 2017-10-13 MED ORDER — BENAZEPRIL HCL 40 MG PO TABS: 40.0000 mg | ORAL_TABLET | Freq: Every day | ORAL | 4 refills | Status: DC

## 2017-10-13 MED ORDER — AMLODIPINE BESYLATE 10 MG PO TABS: 10.0000 mg | ORAL_TABLET | Freq: Every day | ORAL | 4 refills | Status: DC

## 2017-10-13 MED ORDER — ATENOLOL 25 MG PO TABS: 25.0000 mg | ORAL_TABLET | Freq: Two times a day (BID) | ORAL | 4 refills | Status: DC

## 2017-10-13 MED ORDER — LANSOPRAZOLE 30 MG PO CPDR: 30.0000 mg | DELAYED_RELEASE_CAPSULE | Freq: Every day | ORAL | 4 refills | Status: DC

## 2017-10-13 MED ORDER — CLORAZEPATE DIPOTASSIUM 7.5 MG PO TABS: 7.5000 mg | ORAL_TABLET | Freq: Two times a day (BID) | ORAL | 1 refills | Status: DC | PRN

## 2017-10-13 MED ORDER — ATORVASTATIN CALCIUM 20 MG PO TABS: 20.0000 mg | ORAL_TABLET | Freq: Every day | ORAL | 4 refills | Status: DC

## 2017-10-13 NOTE — Assessment & Plan Note (Signed)
The current medical regimen is effective;  continue present plan and medications.  

## 2017-10-13 NOTE — Progress Notes (Signed)
BP 138/60 (BP Location: Left Arm)   Pulse 74   Ht 5\' 6"  (1.676 m)   Wt 182 lb (82.6 kg)   SpO2 96%   BMI 29.38 kg/m    Subjective:    Patient ID: Russell Byrd, male    DOB: 09/13/1945, 72 y.o.   MRN: [id number]  HPI: Russell Byrd is a 72 y.o. male  Chief Complaint  Patient presents with  . Annual Exam   For physical.  Has been doing well with medications no low blood sugar spells or issues.  Blood pressure doing well. 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  Not bad enough to go see orthopedics.  Patient does wear wrist splints at night which helps somewhat.  Anxiety is been stable on medication which he takes faithfully No low blood sugar spells or issues taking medications faithfully  Relevant past medical, surgical, family and social history reviewed and updated as indicated. Interim medical history since our last visit reviewed. Allergies and medications reviewed and updated.  Review of Systems  Constitutional: Negative.   HENT: Negative.   Eyes: Negative.   Respiratory: Negative.   Cardiovascular: Negative.   Gastrointestinal: Negative.   Endocrine: Negative.   Genitourinary: Negative.   Musculoskeletal: Negative.   Skin: Negative.   Allergic/Immunologic: Negative.   Neurological: Negative.   Hematological: Negative.   Psychiatric/Behavioral: Negative.     Per HPI unless specifically indicated above     Objective:    BP 138/60 (BP Location: Left Arm)   Pulse 74   Ht 5\' 6"  (1.676 m)   Wt 182 lb (82.6 kg)   SpO2 96%   BMI 29.38 kg/m   Wt Readings from Last 3 Encounters:  10/13/17 182 lb (82.6 kg)  04/14/17 180 lb (81.6 kg)  10/07/16 182 lb 11.2 oz (82.9 kg)    Physical Exam  Constitutional: He is oriented to person, place, and time. He appears well-developed and well-nourished.  HENT:  Head: Normocephalic and atraumatic.  Right Ear: External ear normal.  Left Ear: External ear normal.  Eyes: Pupils  are equal, round, and reactive to light. Conjunctivae and EOM are normal.  Neck: Normal range of motion. Neck supple.  Cardiovascular: Normal rate, regular rhythm, normal heart sounds and intact distal pulses.  Pulmonary/Chest: Effort normal and breath sounds normal.  Abdominal: Soft. Bowel sounds are normal. There is no splenomegaly or hepatomegaly.  Genitourinary: Rectum normal and penis normal.  Genitourinary Comments: BPH changes  Musculoskeletal: Normal range of motion.  Neurological: He is alert and oriented to person, place, and time. He has normal reflexes.  Skin: No rash noted. No erythema.  Psychiatric: He has a normal mood and affect. His behavior is normal. Judgment and thought content normal.    Results for orders placed or performed in visit on 04/14/17  Bayer DCA Hb A1c Waived  Result Value Ref Range   Bayer DCA Hb A1c Waived 6.2 <7.0 %  LP+ALT+AST Piccolo, Waived  Result Value Ref Range   ALT (SGPT) Piccolo, Waived 17 10 - 47 U/L   AST (SGOT) Piccolo, Waived 25 11 - 38 U/L   Cholesterol Piccolo, Waived 125 <200 mg/dL   HDL Chol Piccolo, Waived 33 (L) >59 mg/dL   Triglycerides Piccolo,Waived 163 (H) <150 mg/dL   Chol/HDL Ratio Piccolo,Waive 3.8 mg/dL   LDL Chol Calc Piccolo Waived 59 <100 mg/dL   VLDL Chol Calc Piccolo,Waive 33 (H) <30 mg/dL  Basic metabolic panel  Result Value Ref Range   Glucose 153 (H) 65 - 99 mg/dL   BUN 13 8 - 27 mg/dL   Creatinine, Ser 0.99 0.76 - 1.27 mg/dL   GFR calc non Af Amer 76 >59 mL/min/1.73   GFR calc Af Amer 88 >59 mL/min/1.73   BUN/Creatinine Ratio 13 10 - 24   Sodium 138 134 - 144 mmol/L   Potassium 5.0 3.5 - 5.2 mmol/L   Chloride 101 96 - 106 mmol/L   CO2 27 20 - 29 mmol/L   Calcium 9.7 8.6 - 10.2 mg/dL      Assessment & Plan:   Problem List Items Addressed This Visit      Cardiovascular and Mediastinum   Hypertension - Primary    The current medical regimen is effective;  continue present plan and medications.         Relevant Medications   atorvastatin (LIPITOR) 20 MG tablet   atenolol (TENORMIN) 25 MG tablet   amLODipine (NORVASC) 10 MG tablet   benazepril (LOTENSIN) 40 MG tablet   Other Relevant Orders   CBC with Differential/Platelet   Comprehensive metabolic panel   Lipid panel   Urinalysis, Routine w reflex microscopic   Bayer DCA Hb A1c Waived     Endocrine   Diabetes mellitus without complication ([facility])    The current medical regimen is effective;  continue present plan and medications.       Relevant Medications   atorvastatin (LIPITOR) 20 MG tablet   benazepril (LOTENSIN) 40 MG tablet   metFORMIN (GLUCOPHAGE) 500 MG tablet   Other Relevant Orders   CBC with Differential/Platelet   Comprehensive metabolic panel   Lipid panel   Urinalysis, Routine w reflex microscopic   Bayer DCA Hb A1c Waived     Genitourinary   BPH (benign prostatic hyperplasia)    The current medical regimen is effective;  continue present plan and medications.       Relevant Orders   PSA   CBC with Differential/Platelet   Comprehensive metabolic panel   Lipid panel   Urinalysis, Routine w reflex microscopic   Bayer DCA Hb A1c Waived     Other   Hyperlipidemia    The current medical regimen is effective;  continue present plan and medications.       Relevant Medications   atorvastatin (LIPITOR) 20 MG tablet   atenolol (TENORMIN) 25 MG tablet   amLODipine (NORVASC) 10 MG tablet   benazepril (LOTENSIN) 40 MG tablet   Other Relevant Orders   CBC with Differential/Platelet   Comprehensive metabolic panel   Lipid panel   Urinalysis, Routine w reflex microscopic   Bayer DCA Hb A1c Waived   Chronic anxiety    The current medical regimen is effective;  continue present plan and medications.       Relevant Medications   clorazepate (TRANXENE) 7.5 MG tablet   Advanced care planning/counseling discussion    A voluntary discussion about advance care planning including the explanation and  discussion of advance directives was extensively discussed  with the patient.  Explanation about the health care proxy and Living will was reviewed and packet with forms with explanation of how to fill them out was given.  .  Time spent: encounter 16+min       Individuals present. Pt        Other Visit Diagnoses    Prostate cancer screening       Relevant Orders   PSA   Thyroid disorder screen  Relevant Orders   TSH       Follow up plan: Return in about 6 months (around 04/14/2018) for BMP,  Lipids, ALT, AST, Hemoglobin A1c.

## 2017-10-13 NOTE — Assessment & Plan Note (Signed)
A voluntary discussion about advance care planning including the explanation and discussion of advance directives was extensively discussed  with the patient.  Explanation about the health care proxy and Living will was reviewed and packet with forms with explanation of how to fill them out was given.  Time spent: encounter 16+ min       Individuals present: Pt.  

## 2017-10-14 ENCOUNTER — Encounter: Payer: Self-pay | Admitting: Family Medicine

## 2017-10-14 LAB — COMPREHENSIVE METABOLIC PANEL
A/G RATIO: 1.8 (ref 1.2–2.2)
ALK PHOS: 91 IU/L (ref 39–117)
ALT: 18 IU/L (ref 0–44)
AST: 17 IU/L (ref 0–40)
Albumin: 4.7 g/dL (ref 3.5–4.8)
BUN/Creatinine Ratio: 15 (ref 10–24)
BUN: 16 mg/dL (ref 8–27)
Bilirubin Total: 0.4 mg/dL (ref 0.0–1.2)
CALCIUM: 9.9 mg/dL (ref 8.6–10.2)
CHLORIDE: 101 mmol/L (ref 96–106)
CO2: 24 mmol/L (ref 20–29)
Creatinine, Ser: 1.1 mg/dL (ref 0.76–1.27)
GFR calc Af Amer: 77 mL/min/{1.73_m2} (ref >59)
GFR calc non Af Amer: 67 mL/min/{1.73_m2} (ref >59)
GLOBULIN, TOTAL: 2.6 g/dL (ref 1.5–4.5)
Glucose: 140 mg/dL — ABNORMAL HIGH (ref 65–99)
POTASSIUM: 5.3 mmol/L — AB (ref 3.5–5.2)
SODIUM: 139 mmol/L (ref 134–144)
Total Protein: 7.3 g/dL (ref 6.0–8.5)

## 2017-10-14 LAB — URINALYSIS, ROUTINE W REFLEX MICROSCOPIC
BILIRUBIN UA: NEGATIVE
Glucose, UA: NEGATIVE
LEUKOCYTES UA: NEGATIVE
NITRITE UA: NEGATIVE
PH UA: 5 (ref 5.0–7.5)
RBC UA: NEGATIVE
SPEC GRAV UA: 1.02 (ref 1.005–1.030)
UUROB: 0.2 mg/dL (ref 0.2–1.0)

## 2017-10-14 LAB — CBC WITH DIFFERENTIAL/PLATELET
Basophils Absolute: 0 10*3/uL (ref 0.0–0.2)
Basos: 0 %
EOS (ABSOLUTE): 0.1 10*3/uL (ref 0.0–0.4)
EOS: 1 %
HEMATOCRIT: 41.1 % (ref 37.5–51.0)
Hemoglobin: 14 g/dL (ref 13.0–17.7)
IMMATURE GRANULOCYTES: 1 %
Immature Grans (Abs): 0.1 10*3/uL (ref 0.0–0.1)
LYMPHS ABS: 1.8 10*3/uL (ref 0.7–3.1)
Lymphs: 16 %
MCH: 30.7 pg (ref 26.6–33.0)
MCHC: 34.1 g/dL (ref 31.5–35.7)
MCV: 90 fL (ref 79–97)
MONOS ABS: 0.7 10*3/uL (ref 0.1–0.9)
Monocytes: 7 %
NEUTROS PCT: 75 %
Neutrophils Absolute: 8.5 10*3/uL — ABNORMAL HIGH (ref 1.4–7.0)
PLATELETS: 227 10*3/uL (ref 150–379)
RBC: 4.56 x10E6/uL (ref 4.14–5.80)
RDW: 13.3 % (ref 12.3–15.4)
WBC: 11.1 10*3/uL — ABNORMAL HIGH (ref 3.4–10.8)

## 2017-10-14 LAB — LIPID PANEL
CHOL/HDL RATIO: 3.2 ratio (ref 0.0–5.0)
CHOLESTEROL TOTAL: 116 mg/dL (ref 100–199)
HDL: 36 mg/dL — AB (ref >39)
LDL Calculated: 58 mg/dL (ref 0–99)
TRIGLYCERIDES: 108 mg/dL (ref 0–149)
VLDL Cholesterol Cal: 22 mg/dL (ref 5–40)

## 2017-10-14 LAB — PSA: Prostate Specific Ag, Serum: 0.4 ng/mL (ref 0.0–4.0)

## 2017-10-14 LAB — BAYER DCA HB A1C WAIVED: HB A1C: 6.7 % (ref <7.0)

## 2017-10-14 LAB — MICROSCOPIC EXAMINATION: Bacteria, UA: NONE SEEN

## 2017-10-14 LAB — TSH: TSH: 3.8 u[IU]/mL (ref 0.450–4.500)

## 2017-11-12 ENCOUNTER — Other Ambulatory Visit: Payer: Self-pay | Admitting: Family Medicine

## 2018-02-24 DIAGNOSIS — M1711 Unilateral primary osteoarthritis, right knee: Secondary | ICD-10-CM | POA: Diagnosis not present

## 2018-02-24 DIAGNOSIS — E119 Type 2 diabetes mellitus without complications: Secondary | ICD-10-CM | POA: Diagnosis not present

## 2018-02-24 DIAGNOSIS — M170 Bilateral primary osteoarthritis of knee: Secondary | ICD-10-CM | POA: Diagnosis not present

## 2018-02-24 DIAGNOSIS — L989 Disorder of the skin and subcutaneous tissue, unspecified: Secondary | ICD-10-CM | POA: Diagnosis not present

## 2018-04-20 ENCOUNTER — Encounter: Payer: Self-pay | Admitting: Family Medicine

## 2018-04-20 ENCOUNTER — Ambulatory Visit (INDEPENDENT_AMBULATORY_CARE_PROVIDER_SITE_OTHER): Payer: PPO | Admitting: Family Medicine

## 2018-04-20 VITALS — BP 150/65 | HR 59 | Wt 182.0 lb

## 2018-04-20 DIAGNOSIS — I10 Essential (primary) hypertension: Secondary | ICD-10-CM

## 2018-04-20 DIAGNOSIS — E119 Type 2 diabetes mellitus without complications: Secondary | ICD-10-CM

## 2018-04-20 DIAGNOSIS — E785 Hyperlipidemia, unspecified: Secondary | ICD-10-CM | POA: Diagnosis not present

## 2018-04-20 DIAGNOSIS — Z23 Encounter for immunization: Secondary | ICD-10-CM | POA: Diagnosis not present

## 2018-04-20 MED ORDER — CLORAZEPATE DIPOTASSIUM 7.5 MG PO TABS: 7.5000 mg | ORAL_TABLET | Freq: Two times a day (BID) | ORAL | 1 refills | Status: DC | PRN

## 2018-04-20 NOTE — Assessment & Plan Note (Signed)
Discussed hypertension poor control.  Patient will do better with diet nutrition exercise for better control and recheck at physical.

## 2018-04-20 NOTE — Assessment & Plan Note (Addendum)
Poor control patient will do better with diet weight loss exercise and if not better at next visit will consider medication change

## 2018-04-20 NOTE — Progress Notes (Signed)
BP (!) 150/65 (BP Location: Left Arm)   Pulse (!) 59   Wt 182 lb (82.6 kg)   SpO2 97%   BMI 29.38 kg/m    Subjective:    Patient ID: Russell Byrd, male    DOB: 09-20-1945, 72 y.o.   MRN: [id number]  HPI: Russell Byrd is a 72 y.o. male  Chief Complaint  Patient presents with  . Follow-up  . Hypertension  . Hyperlipidemia   Patient follow-up for hypertension hypercholesterol and diabetes all in all is been doing well has not been able to follow his diet very well.  Taking his medications faithfully.  Has usual excuse of a lot of stress for blood pressure being elevated. Is also had dietary indiscretion for his diabetes.  Relevant past medical, surgical, family and social history reviewed and updated as indicated. Interim medical history since our last visit reviewed. Allergies and medications reviewed and updated.  Review of Systems  Constitutional: Negative.   Respiratory: Negative.   Cardiovascular: Negative.     Per HPI unless specifically indicated above     Objective:    BP (!) 150/65 (BP Location: Left Arm)   Pulse (!) 59   Wt 182 lb (82.6 kg)   SpO2 97%   BMI 29.38 kg/m   Wt Readings from Last 3 Encounters:  04/20/18 182 lb (82.6 kg)  10/13/17 182 lb (82.6 kg)  04/14/17 180 lb (81.6 kg)    Physical Exam  Constitutional: He is oriented to person, place, and time. He appears well-developed and well-nourished.  HENT:  Head: Normocephalic and atraumatic.  Eyes: Conjunctivae and EOM are normal.  Neck: Normal range of motion.  Cardiovascular: Normal rate, regular rhythm and normal heart sounds.  Pulmonary/Chest: Effort normal and breath sounds normal.  Musculoskeletal: Normal range of motion.  Neurological: He is alert and oriented to person, place, and time.  Skin: No erythema.  Psychiatric: He has a normal mood and affect. His behavior is normal. Judgment and thought content normal.    Results for orders placed or performed in visit on 10/13/17   Microscopic Examination  Result Value Ref Range   WBC, UA 0-5 0 - 5 /hpf   RBC, UA 0-2 0 - 2 /hpf   Epithelial Cells (non renal) CANCELED    Mucus, UA Present Not Estab.   Bacteria, UA None seen None seen/Few  CBC with Differential/Platelet  Result Value Ref Range   WBC 11.1 (H) 3.4 - 10.8 x10E3/uL   RBC 4.56 4.14 - 5.80 x10E6/uL   Hemoglobin 14.0 13.0 - 17.7 g/dL   Hematocrit 41.1 37.5 - 51.0 %   MCV 90 79 - 97 fL   MCH 30.7 26.6 - 33.0 pg   MCHC 34.1 31.5 - 35.7 g/dL   RDW 13.3 12.3 - 15.4 %   Platelets 227 150 - 379 x10E3/uL   Neutrophils 75 Not Estab. %   Lymphs 16 Not Estab. %   Monocytes 7 Not Estab. %   Eos 1 Not Estab. %   Basos 0 Not Estab. %   Neutrophils Absolute 8.5 (H) 1.4 - 7.0 x10E3/uL   Lymphocytes Absolute 1.8 0.7 - 3.1 x10E3/uL   Monocytes Absolute 0.7 0.1 - 0.9 x10E3/uL   EOS (ABSOLUTE) 0.1 0.0 - 0.4 x10E3/uL   Basophils Absolute 0.0 0.0 - 0.2 x10E3/uL   Immature Granulocytes 1 Not Estab. %   Immature Grans (Abs) 0.1 0.0 - 0.1 x10E3/uL  Comprehensive metabolic panel  Result Value Ref Range  Glucose 140 (H) 65 - 99 mg/dL   BUN 16 8 - 27 mg/dL   Creatinine, Ser 1.10 0.76 - 1.27 mg/dL   GFR calc non Af Amer 67 >59 mL/min/1.73   GFR calc Af Amer 77 >59 mL/min/1.73   BUN/Creatinine Ratio 15 10 - 24   Sodium 139 134 - 144 mmol/L   Potassium 5.3 (H) 3.5 - 5.2 mmol/L   Chloride 101 96 - 106 mmol/L   CO2 24 20 - 29 mmol/L   Calcium 9.9 8.6 - 10.2 mg/dL   Total Protein 7.3 6.0 - 8.5 g/dL   Albumin 4.7 3.5 - 4.8 g/dL   Globulin, Total 2.6 1.5 - 4.5 g/dL   Albumin/Globulin Ratio 1.8 1.2 - 2.2   Bilirubin Total 0.4 0.0 - 1.2 mg/dL   Alkaline Phosphatase 91 39 - 117 IU/L   AST 17 0 - 40 IU/L   ALT 18 0 - 44 IU/L  Lipid panel  Result Value Ref Range   Cholesterol, Total 116 100 - 199 mg/dL   Triglycerides 108 0 - 149 mg/dL   HDL 36 (L) >39 mg/dL   VLDL Cholesterol Cal 22 5 - 40 mg/dL   LDL Calculated 58 0 - 99 mg/dL   Chol/HDL Ratio 3.2 0.0 - 5.0 ratio   PSA  Result Value Ref Range   Prostate Specific Ag, Serum 0.4 0.0 - 4.0 ng/mL  TSH  Result Value Ref Range   TSH 3.800 0.450 - 4.500 uIU/mL  Urinalysis, Routine w reflex microscopic  Result Value Ref Range   Specific Gravity, UA 1.020 1.005 - 1.030   pH, UA 5.0 5.0 - 7.5   Color, UA Yellow Yellow   Appearance Ur Clear Clear   Leukocytes, UA Negative Negative   Protein, UA 1+ (A) Negative/Trace   Glucose, UA Negative Negative   Ketones, UA Trace (A) Negative   RBC, UA Negative Negative   Bilirubin, UA Negative Negative   Urobilinogen, Ur 0.2 0.2 - 1.0 mg/dL   Nitrite, UA Negative Negative   Microscopic Examination See below:   Bayer DCA Hb A1c Waived  Result Value Ref Range   HB A1C (BAYER DCA - WAIVED) 6.7 <7.0 %      Assessment & Plan:   Problem List Items Addressed This Visit      Cardiovascular and Mediastinum   Hypertension    Discussed hypertension poor control.  Patient will do better with diet nutrition exercise for better control and recheck at physical.      Relevant Orders   Basic metabolic panel     Endocrine   Diabetes mellitus without complication ([place]) - Primary    Poor control patient will do better with diet weight loss exercise and if not better at next visit will consider medication change      Relevant Orders   Bayer DCA Hb A1c Waived   Basic metabolic panel     Other   Hyperlipidemia    The current medical regimen is effective;  continue present plan and medications.       Relevant Orders   LP+ALT+AST Piccolo, [place]    Other Visit Diagnoses    Needs flu shot       Relevant Orders   Flu vaccine HIGH DOSE PF (Fluzone High dose) (Completed)       Follow up plan: Return in about 6 months (around 10/20/2018) for Physical Exam, Hemoglobin A1c.

## 2018-04-20 NOTE — Assessment & Plan Note (Signed)
The current medical regimen is effective;  continue present plan and medications.  

## 2018-04-21 ENCOUNTER — Encounter: Payer: Self-pay | Admitting: Family Medicine

## 2018-04-21 LAB — LP+ALT+AST PICCOLO, WAIVED
ALT (SGPT) Piccolo, Waived: 32 U/L (ref 10–47)
AST (SGOT) Piccolo, Waived: 30 U/L (ref 11–38)
CHOL/HDL RATIO PICCOLO,WAIVE: 3.9 mg/dL
CHOLESTEROL PICCOLO, WAIVED: 127 mg/dL (ref <200)
HDL CHOL PICCOLO, WAIVED: 33 mg/dL — AB (ref >59)
LDL CHOL CALC PICCOLO WAIVED: 63 mg/dL (ref <100)
Triglycerides Piccolo,Waived: 156 mg/dL — ABNORMAL HIGH (ref <150)
VLDL Chol Calc Piccolo,Waive: 31 mg/dL — ABNORMAL HIGH (ref <30)

## 2018-04-21 LAB — BASIC METABOLIC PANEL
BUN / CREAT RATIO: 19 (ref 10–24)
BUN: 18 mg/dL (ref 8–27)
CO2: 22 mmol/L (ref 20–29)
Calcium: 9.7 mg/dL (ref 8.6–10.2)
Chloride: 100 mmol/L (ref 96–106)
Creatinine, Ser: 0.96 mg/dL (ref 0.76–1.27)
GFR, EST AFRICAN AMERICAN: 91 mL/min/{1.73_m2} (ref >59)
GFR, EST NON AFRICAN AMERICAN: 79 mL/min/{1.73_m2} (ref >59)
Glucose: 147 mg/dL — ABNORMAL HIGH (ref 65–99)
POTASSIUM: 5 mmol/L (ref 3.5–5.2)
SODIUM: 140 mmol/L (ref 134–144)

## 2018-04-21 LAB — BAYER DCA HB A1C WAIVED: HB A1C (BAYER DCA - WAIVED): 7.2 % — ABNORMAL HIGH (ref <7.0)

## 2018-04-28 DIAGNOSIS — L821 Other seborrheic keratosis: Secondary | ICD-10-CM | POA: Diagnosis not present

## 2018-04-28 DIAGNOSIS — D2272 Melanocytic nevi of left lower limb, including hip: Secondary | ICD-10-CM | POA: Diagnosis not present

## 2018-04-28 DIAGNOSIS — D2271 Melanocytic nevi of right lower limb, including hip: Secondary | ICD-10-CM | POA: Diagnosis not present

## 2018-04-28 DIAGNOSIS — D2261 Melanocytic nevi of right upper limb, including shoulder: Secondary | ICD-10-CM | POA: Diagnosis not present

## 2018-04-28 DIAGNOSIS — D2262 Melanocytic nevi of left upper limb, including shoulder: Secondary | ICD-10-CM | POA: Diagnosis not present

## 2018-07-22 DIAGNOSIS — M1711 Unilateral primary osteoarthritis, right knee: Secondary | ICD-10-CM | POA: Diagnosis not present

## 2018-09-10 ENCOUNTER — Other Ambulatory Visit: Payer: Self-pay | Admitting: Family Medicine

## 2018-09-10 MED ORDER — LANSOPRAZOLE 30 MG PO CPDR: 30.0000 mg | DELAYED_RELEASE_CAPSULE | Freq: Every day | ORAL | 4 refills | Status: DC

## 2018-09-10 MED ORDER — AMLODIPINE BESYLATE 10 MG PO TABS: 10.0000 mg | ORAL_TABLET | Freq: Every day | ORAL | 4 refills | Status: DC

## 2018-09-10 MED ORDER — BENAZEPRIL HCL 40 MG PO TABS: 40.0000 mg | ORAL_TABLET | Freq: Every day | ORAL | 4 refills | Status: DC

## 2018-09-10 MED ORDER — ATENOLOL 25 MG PO TABS: 25.0000 mg | ORAL_TABLET | Freq: Two times a day (BID) | ORAL | 4 refills | Status: DC

## 2018-09-10 MED ORDER — METFORMIN HCL 500 MG PO TABS: 1000.0000 mg | ORAL_TABLET | Freq: Two times a day (BID) | ORAL | 4 refills | Status: DC

## 2018-09-10 MED ORDER — ATORVASTATIN CALCIUM 20 MG PO TABS: 20.0000 mg | ORAL_TABLET | Freq: Every day | ORAL | 4 refills | Status: DC

## 2018-09-11 DIAGNOSIS — M1711 Unilateral primary osteoarthritis, right knee: Secondary | ICD-10-CM | POA: Diagnosis not present

## 2018-10-26 ENCOUNTER — Other Ambulatory Visit: Payer: Self-pay

## 2018-10-26 ENCOUNTER — Ambulatory Visit (INDEPENDENT_AMBULATORY_CARE_PROVIDER_SITE_OTHER): Payer: PPO | Admitting: Family Medicine

## 2018-10-26 ENCOUNTER — Encounter: Payer: Self-pay | Admitting: Family Medicine

## 2018-10-26 DIAGNOSIS — I10 Essential (primary) hypertension: Secondary | ICD-10-CM

## 2018-10-26 DIAGNOSIS — E1169 Type 2 diabetes mellitus with other specified complication: Secondary | ICD-10-CM | POA: Diagnosis not present

## 2018-10-26 DIAGNOSIS — F419 Anxiety disorder, unspecified: Secondary | ICD-10-CM | POA: Diagnosis not present

## 2018-10-26 DIAGNOSIS — E785 Hyperlipidemia, unspecified: Secondary | ICD-10-CM | POA: Diagnosis not present

## 2018-10-26 MED ORDER — LANSOPRAZOLE 30 MG PO CPDR: 30.0000 mg | DELAYED_RELEASE_CAPSULE | Freq: Every day | ORAL | 4 refills | Status: DC

## 2018-10-26 MED ORDER — AMLODIPINE BESYLATE 10 MG PO TABS: 10.0000 mg | ORAL_TABLET | Freq: Every day | ORAL | 4 refills | Status: DC

## 2018-10-26 MED ORDER — METFORMIN HCL 500 MG PO TABS: 1000.0000 mg | ORAL_TABLET | Freq: Two times a day (BID) | ORAL | 4 refills | Status: DC

## 2018-10-26 MED ORDER — ATORVASTATIN CALCIUM 20 MG PO TABS: 20.0000 mg | ORAL_TABLET | Freq: Every day | ORAL | 4 refills | Status: DC

## 2018-10-26 MED ORDER — CLORAZEPATE DIPOTASSIUM 7.5 MG PO TABS: 7.5000 mg | ORAL_TABLET | Freq: Two times a day (BID) | ORAL | 1 refills | Status: DC | PRN

## 2018-10-26 MED ORDER — ATENOLOL 25 MG PO TABS: 25.0000 mg | ORAL_TABLET | Freq: Two times a day (BID) | ORAL | 4 refills | Status: DC

## 2018-10-26 MED ORDER — BENAZEPRIL HCL 40 MG PO TABS: 40.0000 mg | ORAL_TABLET | Freq: Every day | ORAL | 4 refills | Status: DC

## 2018-10-26 NOTE — Progress Notes (Signed)
There were no vitals taken for this visit.   Subjective:    Patient ID: Russell Byrd, male    DOB: [birth year], 73 y.o.   MRN: [id number]  HPI: Russell Byrd is a 73 y.o. male  Med check Knee pain rt knee bone on bone  Telemedicine using audio/video telecommunications for a synchronous communication visit. Today's visit due to COVID-19 isolation precautions I connected with and verified that I am speaking with the correct person using two identifiers.   I discussed the limitations, risks, security and privacy concerns of performing an evaluation and management service by telecommunication and the availability of in person appointments. I also discussed with the patient that there may be a patient responsible charge related to this service. The patient expressed understanding and agreed to proceed. The patient's location is home I am at home.  Discussed with patient medications doing well not checking blood sugar or blood pressure but not having any issues no low blood sugar spells. Blood pressure cholesterol seems to be doing well.  Having some BPH symptoms but not enough to take medicines for. Is bothered especially by his knee which is bone-on-bone. Wants refill on his clorazepate which he takes on a as needed basis and does well with usage has remained stable over the years.  Relevant past medical, surgical, family and social history reviewed and updated as indicated. Interim medical history since our last visit reviewed. Allergies and medications reviewed and updated.  Review of Systems  Constitutional: Negative.   Respiratory: Negative.   Cardiovascular: Negative.     Per HPI unless specifically indicated above     Objective:    There were no vitals taken for this visit.  Wt Readings from Last 3 Encounters:  04/20/18 182 lb (82.6 kg)  10/13/17 182 lb (82.6 kg)  04/14/17 180 lb (81.6 kg)    Physical Exam  Results for orders placed or performed in visit on  04/20/18  Bayer DCA Hb A1c Waived  Result Value Ref Range   HB A1C (BAYER DCA - WAIVED) 7.2 (H) <7.0 %  LP+ALT+AST Piccolo, Waived  Result Value Ref Range   ALT (SGPT) Piccolo, Waived 32 10 - 47 U/L   AST (SGOT) Piccolo, Waived 30 11 - 38 U/L   Cholesterol Piccolo, Waived 127 <200 mg/dL   HDL Chol Piccolo, Waived 33 (L) >59 mg/dL   Triglycerides Piccolo,Waived 156 (H) <150 mg/dL   Chol/HDL Ratio Piccolo,Waive 3.9 mg/dL   LDL Chol Calc Piccolo Waived 63 <100 mg/dL   VLDL Chol Calc Piccolo,Waive 31 (H) <30 mg/dL  Basic metabolic panel  Result Value Ref Range   Glucose 147 (H) 65 - 99 mg/dL   BUN 18 8 - 27 mg/dL   Creatinine, Ser 0.96 0.76 - 1.27 mg/dL   GFR calc non Af Amer 79 >59 mL/min/1.73   GFR calc Af Amer 91 >59 mL/min/1.73   BUN/Creatinine Ratio 19 10 - 24   Sodium 140 134 - 144 mmol/L   Potassium 5.0 3.5 - 5.2 mmol/L   Chloride 100 96 - 106 mmol/L   CO2 22 20 - 29 mmol/L   Calcium 9.7 8.6 - 10.2 mg/dL      Assessment & Plan:   Problem List Items Addressed This Visit      Cardiovascular and Mediastinum   Hypertension    The current medical regimen is effective;  continue present plan and medications.       Relevant Medications   amLODipine (NORVASC) 10 MG  tablet   atorvastatin (LIPITOR) 20 MG tablet   atenolol (TENORMIN) 25 MG tablet   benazepril (LOTENSIN) 40 MG tablet     Endocrine   Diabetes mellitus associated with hormonal etiology ([place])    The current medical regimen is effective;  continue present plan and medications.       Relevant Medications   metFORMIN (GLUCOPHAGE) 500 MG tablet   atorvastatin (LIPITOR) 20 MG tablet   benazepril (LOTENSIN) 40 MG tablet     Other   Hyperlipidemia    The current medical regimen is effective;  continue present plan and medications.       Relevant Medications   amLODipine (NORVASC) 10 MG tablet   atorvastatin (LIPITOR) 20 MG tablet   atenolol (TENORMIN) 25 MG tablet   benazepril (LOTENSIN) 40 MG tablet    Chronic anxiety    Discussed meds and care      Relevant Medications   clorazepate (TRANXENE) 7.5 MG tablet      I discussed the assessment and treatment plan with the patient. The patient was provided an opportunity to ask questions and all were answered. The patient agreed with the plan and demonstrated an understanding of the instructions.   The patient was advised to call back or seek an in-person evaluation if the symptoms worsen or if the condition fails to improve as anticipated.   I provided 21+ minutes of time during this encounter. Follow up plan: Return in about 3 months (around 01/25/2019) for Physical Exam, Hemoglobin A1c.

## 2018-10-26 NOTE — Assessment & Plan Note (Signed)
The current medical regimen is effective;  continue present plan and medications.  

## 2018-10-26 NOTE — Assessment & Plan Note (Signed)
Discussed meds and care

## 2019-03-17 ENCOUNTER — Telehealth: Payer: Self-pay | Admitting: Family Medicine

## 2019-03-17 NOTE — Telephone Encounter (Signed)
Pt wants Dr. Jeananne Rama to know prior to physical:  Legs, feet, and ankles are swelling all summer as soon as he gets out of bed.  Pt states that he can't get his shoes on unless he puts them on first thing in the morning.  Pt states that his sister states that hyperthyroid and diabetes runs in his family and wants to know if he can be checked for these at CPE.  Pt states that he has a sore on his foot at his pinky toe - it's been sore for three weeks.  States he can't see a wound or a sore it is just painful to touch - sometimes it is very itchy and he gets relief with foot powder.  Pt states that he is also having lower back pain.

## 2019-03-21 NOTE — Telephone Encounter (Signed)
Call pt will check all at office visit

## 2019-03-22 ENCOUNTER — Other Ambulatory Visit: Payer: Self-pay

## 2019-03-22 ENCOUNTER — Encounter: Payer: Self-pay | Admitting: Family Medicine

## 2019-03-22 ENCOUNTER — Ambulatory Visit (INDEPENDENT_AMBULATORY_CARE_PROVIDER_SITE_OTHER): Payer: PPO | Admitting: Family Medicine

## 2019-03-22 DIAGNOSIS — I10 Essential (primary) hypertension: Secondary | ICD-10-CM

## 2019-03-22 DIAGNOSIS — E1169 Type 2 diabetes mellitus with other specified complication: Secondary | ICD-10-CM | POA: Diagnosis not present

## 2019-03-22 DIAGNOSIS — M25561 Pain in right knee: Secondary | ICD-10-CM

## 2019-03-22 DIAGNOSIS — F419 Anxiety disorder, unspecified: Secondary | ICD-10-CM | POA: Diagnosis not present

## 2019-03-22 DIAGNOSIS — G8929 Other chronic pain: Secondary | ICD-10-CM | POA: Diagnosis not present

## 2019-03-22 DIAGNOSIS — E785 Hyperlipidemia, unspecified: Secondary | ICD-10-CM | POA: Diagnosis not present

## 2019-03-22 MED ORDER — HYDROCHLOROTHIAZIDE 25 MG PO TABS: 25.0000 mg | ORAL_TABLET | Freq: Every day | ORAL | 3 refills | Status: DC

## 2019-03-22 NOTE — Telephone Encounter (Signed)
Had virtual visit appt with Dr.Crissman

## 2019-03-22 NOTE — Assessment & Plan Note (Addendum)
Discuss hypertension and possibility of amlodipine causing feet swelling will discontinue medication use hydrochlorothiazide 25 mg for blood pressure control and maybe help with some edema issues recheck in 1 month or so.

## 2019-03-22 NOTE — Assessment & Plan Note (Signed)
Discussed knee pain will follow-up with orthopedics

## 2019-03-22 NOTE — Progress Notes (Signed)
There were no vitals taken for this visit.   Subjective:    Patient ID: Russell Byrd, male    DOB: 08/24/1945, 73 y.o.   MRN: [id number]  HPI: Russell Byrd is a 73 y.o. male  Med check Patient having a hard time with life.  Having problems with his knee with limited ability to stand his shoulder and hand. His biggest problem is feet swelling which come on within 30 minutes of standing in the morning and last all day.  Patient bothered by nocturia but no PND or orthopnea. Reviewed medication taking amlodipine but is been on amlodipine for some time. Feet swelling started this spring.  Relevant past medical, surgical, family and social history reviewed and updated as indicated. Interim medical history since our last visit reviewed. Allergies and medications reviewed and updated.  Review of Systems  Constitutional: Negative.   Respiratory: Negative.   Cardiovascular: Negative.     Per HPI unless specifically indicated above     Objective:    There were no vitals taken for this visit.  Wt Readings from Last 3 Encounters:  04/20/18 182 lb (82.6 kg)  10/13/17 182 lb (82.6 kg)  04/14/17 180 lb (81.6 kg)    Physical Exam  Results for orders placed or performed in visit on 04/20/18  Bayer DCA Hb A1c Waived  Result Value Ref Range   HB A1C (BAYER DCA - WAIVED) 7.2 (H) <7.0 %  LP+ALT+AST Piccolo, Waived  Result Value Ref Range   ALT (SGPT) Piccolo, Waived 32 10 - 47 U/L   AST (SGOT) Piccolo, Waived 30 11 - 38 U/L   Cholesterol Piccolo, Waived 127 <200 mg/dL   HDL Chol Piccolo, Waived 33 (L) >59 mg/dL   Triglycerides Piccolo,Waived 156 (H) <150 mg/dL   Chol/HDL Ratio Piccolo,Waive 3.9 mg/dL   LDL Chol Calc Piccolo Waived 63 <100 mg/dL   VLDL Chol Calc Piccolo,Waive 31 (H) <30 mg/dL  Basic metabolic panel  Result Value Ref Range   Glucose 147 (H) 65 - 99 mg/dL   BUN 18 8 - 27 mg/dL   Creatinine, Ser 0.96 0.76 - 1.27 mg/dL   GFR calc non Af Amer 79 >59 mL/min/1.73    GFR calc Af Amer 91 >59 mL/min/1.73   BUN/Creatinine Ratio 19 10 - 24   Sodium 140 134 - 144 mmol/L   Potassium 5.0 3.5 - 5.2 mmol/L   Chloride 100 96 - 106 mmol/L   CO2 22 20 - 29 mmol/L   Calcium 9.7 8.6 - 10.2 mg/dL      Assessment & Plan:   Problem List Items Addressed This Visit      Cardiovascular and Mediastinum   Hypertension    Discuss hypertension and possibility of amlodipine causing feet swelling will discontinue medication use hydrochlorothiazide 25 mg for blood pressure control and maybe help with some edema issues recheck in 1 month or so.       Relevant Medications   hydrochlorothiazide (HYDRODIURIL) 25 MG tablet   Other Relevant Orders   Comprehensive metabolic panel   CBC with Differential/Platelet   TSH     Endocrine   Diabetes mellitus associated with hormonal etiology ([place])    The current medical regimen is effective;  continue present plan and medications.       Relevant Orders   Bayer DCA Hb A1c Waived   Comprehensive metabolic panel   CBC with Differential/Platelet   TSH     Other   Hyperlipidemia    The  current medical regimen is effective;  continue present plan and medications.       Relevant Medications   hydrochlorothiazide (HYDRODIURIL) 25 MG tablet   Other Relevant Orders   Lipid panel   CBC with Differential/Platelet   TSH   Urinalysis, Routine w reflex microscopic   Chronic anxiety    The current medical regimen is effective;  continue present plan and medications.       Relevant Orders   CBC with Differential/Platelet   TSH   Right knee pain    Discussed knee pain will follow-up with orthopedics          Telemedicine using audio/video telecommunications for a synchronous communication visit. Today's visit due to COVID-19 isolation precautions I connected with and verified that I am speaking with the correct person using two identifiers.   I discussed the limitations, risks, security and privacy concerns of  performing an evaluation and management service by telecommunication and the availability of in person appointments. I also discussed with the patient that there may be a patient responsible charge related to this service. The patient expressed understanding and agreed to proceed. The patient's location is home. I am at home.   I discussed the assessment and treatment plan with the patient. The patient was provided an opportunity to ask questions and all were answered. The patient agreed with the plan and demonstrated an understanding of the instructions.   The patient was advised to call back or seek an in-person evaluation if the symptoms worsen or if the condition fails to improve as anticipated.   I provided 21+ minutes of time during this encounter. Follow up plan: Return in about 4 weeks (around 04/19/2019) for Blood pressure check and edema check.

## 2019-03-22 NOTE — Assessment & Plan Note (Signed)
The current medical regimen is effective;  continue present plan and medications.  

## 2019-03-23 MED ORDER — CLORAZEPATE DIPOTASSIUM 7.5 MG PO TABS: 7.5000 mg | ORAL_TABLET | Freq: Two times a day (BID) | ORAL | 1 refills | Status: DC | PRN

## 2019-03-29 ENCOUNTER — Other Ambulatory Visit: Payer: Self-pay

## 2019-03-29 ENCOUNTER — Other Ambulatory Visit: Payer: PPO

## 2019-03-29 ENCOUNTER — Ambulatory Visit (INDEPENDENT_AMBULATORY_CARE_PROVIDER_SITE_OTHER): Payer: PPO

## 2019-03-29 VITALS — BP 131/76 | HR 71

## 2019-03-29 DIAGNOSIS — F419 Anxiety disorder, unspecified: Secondary | ICD-10-CM

## 2019-03-29 DIAGNOSIS — Z23 Encounter for immunization: Secondary | ICD-10-CM

## 2019-03-29 DIAGNOSIS — E785 Hyperlipidemia, unspecified: Secondary | ICD-10-CM | POA: Diagnosis not present

## 2019-03-29 DIAGNOSIS — I10 Essential (primary) hypertension: Secondary | ICD-10-CM | POA: Diagnosis not present

## 2019-03-29 DIAGNOSIS — E1169 Type 2 diabetes mellitus with other specified complication: Secondary | ICD-10-CM

## 2019-03-30 LAB — COMPREHENSIVE METABOLIC PANEL
ALT: 15 IU/L (ref 0–44)
AST: 14 IU/L (ref 0–40)
Albumin/Globulin Ratio: 1.7 (ref 1.2–2.2)
Albumin: 4.6 g/dL (ref 3.7–4.7)
Alkaline Phosphatase: 100 IU/L (ref 39–117)
BUN/Creatinine Ratio: 18 (ref 10–24)
BUN: 21 mg/dL (ref 8–27)
Bilirubin Total: 0.4 mg/dL (ref 0.0–1.2)
CO2: 23 mmol/L (ref 20–29)
Calcium: 9.9 mg/dL (ref 8.6–10.2)
Chloride: 99 mmol/L (ref 96–106)
Creatinine, Ser: 1.2 mg/dL (ref 0.76–1.27)
GFR calc Af Amer: 69 mL/min/{1.73_m2} (ref >59)
GFR calc non Af Amer: 60 mL/min/{1.73_m2} (ref >59)
Globulin, Total: 2.7 g/dL (ref 1.5–4.5)
Glucose: 169 mg/dL — ABNORMAL HIGH (ref 65–99)
Potassium: 5.3 mmol/L — ABNORMAL HIGH (ref 3.5–5.2)
Sodium: 137 mmol/L (ref 134–144)
Total Protein: 7.3 g/dL (ref 6.0–8.5)

## 2019-03-30 LAB — URINALYSIS, ROUTINE W REFLEX MICROSCOPIC
Bilirubin, UA: NEGATIVE
Glucose, UA: NEGATIVE
Ketones, UA: NEGATIVE
Leukocytes,UA: NEGATIVE
Nitrite, UA: NEGATIVE
RBC, UA: NEGATIVE
Specific Gravity, UA: 1.015 (ref 1.005–1.030)
Urobilinogen, Ur: 0.2 mg/dL (ref 0.2–1.0)
pH, UA: 5.5 (ref 5.0–7.5)

## 2019-03-30 LAB — CBC WITH DIFFERENTIAL/PLATELET
Basophils Absolute: 0.1 10*3/uL (ref 0.0–0.2)
Basos: 1 %
EOS (ABSOLUTE): 0.1 10*3/uL (ref 0.0–0.4)
Eos: 1 %
Hematocrit: 37.5 % (ref 37.5–51.0)
Hemoglobin: 13.4 g/dL (ref 13.0–17.7)
Immature Grans (Abs): 0.1 10*3/uL (ref 0.0–0.1)
Immature Granulocytes: 1 %
Lymphocytes Absolute: 1.7 10*3/uL (ref 0.7–3.1)
Lymphs: 16 %
MCH: 31.5 pg (ref 26.6–33.0)
MCHC: 35.7 g/dL (ref 31.5–35.7)
MCV: 88 fL (ref 79–97)
Monocytes Absolute: 0.8 10*3/uL (ref 0.1–0.9)
Monocytes: 7 %
Neutrophils Absolute: 8.3 10*3/uL — ABNORMAL HIGH (ref 1.4–7.0)
Neutrophils: 74 %
Platelets: 219 10*3/uL (ref 150–450)
RBC: 4.26 x10E6/uL (ref 4.14–5.80)
RDW: 12.2 % (ref 11.6–15.4)
WBC: 11 10*3/uL — ABNORMAL HIGH (ref 3.4–10.8)

## 2019-03-30 LAB — BAYER DCA HB A1C WAIVED: HB A1C (BAYER DCA - WAIVED): 8.1 % — ABNORMAL HIGH (ref <7.0)

## 2019-03-30 LAB — LIPID PANEL
Chol/HDL Ratio: 4 ratio (ref 0.0–5.0)
Cholesterol, Total: 125 mg/dL (ref 100–199)
HDL: 31 mg/dL — ABNORMAL LOW (ref >39)
LDL Chol Calc (NIH): 63 mg/dL (ref 0–99)
Triglycerides: 186 mg/dL — ABNORMAL HIGH (ref 0–149)
VLDL Cholesterol Cal: 31 mg/dL (ref 5–40)

## 2019-03-30 LAB — MICROSCOPIC EXAMINATION
Bacteria, UA: NONE SEEN
RBC, Urine: NONE SEEN /hpf (ref 0–2)
WBC, UA: NONE SEEN /hpf (ref 0–5)

## 2019-03-30 LAB — TSH: TSH: 3.67 u[IU]/mL (ref 0.450–4.500)

## 2019-04-19 ENCOUNTER — Encounter: Payer: PPO | Admitting: Family Medicine

## 2019-08-11 ENCOUNTER — Telehealth: Payer: Self-pay | Admitting: Family Medicine

## 2019-08-11 NOTE — Chronic Care Management (AMB) (Signed)
  Chronic Care Management   Outreach Note  08/11/2019 Name: Russell Byrd MRN: [id number] DOB: 1946/02/04  Russell Byrd is a 74 y.o. year old male who is a primary care patient of Crissman, Jeannette How, MD. I reached out to Russell Byrd by phone today in response to a referral sent by Mr. Russell Byrd [facility] health plan.     An unsuccessful telephone outreach was attempted today. The patient was referred to the case management team by for assistance with care management and care coordination.   Follow Up Plan: A HIPPA compliant phone message was left for the patient providing contact information and requesting a return call.  The care management team will reach out to the patient again over the next 7 days.  If patient returns call to provider office, please advise to call [place] at [facility], [place], [place], [place] [address] Direct Dial: [phone] [email or web address] Website: [email or web address]

## 2019-08-13 NOTE — Chronic Care Management (AMB) (Signed)
  Chronic Care Management   Outreach Note  08/13/2019 Name: [name] MRN: [id number] DOB: 07-03-1946  Russell Byrd is a 74 y.o. year old male who is a primary care patient of Crissman, Jeannette How, MD. I reached out to Ellison Hughs by phone today in response to a referral sent by Mr. [facility] health plan.     A second unsuccessful telephone outreach was attempted today. The patient was referred to the case management team for assistance with care management and care coordination.   Follow Up Plan: A HIPPA compliant phone message was left for the patient providing contact information and requesting a return call.  The care management team will reach out to the patient again over the next 7 days.  If patient returns call to provider office, please advise to call [place] at [place], [place], [place], [place] [address] Direct Dial: [phone] [email or web address] Website: [email or web address]

## 2019-08-18 NOTE — Chronic Care Management (AMB) (Signed)
  Chronic Care Management   Outreach Note  08/18/2019 Name: Russell Byrd MRN: [id number] DOB: 1946-03-12  Russell Byrd is a 74 y.o. year old male who is a primary care patient of Crissman, Jeannette How, MD. I reached out to Russell Byrd by phone today in response to a referral sent by Russell Byrd [facility] health plan.     Third unsuccessful telephone outreach was attempted today. The patient was referred to the case management team for assistance with care management and care coordination. The patient's primary care provider has been notified of our unsuccessful attempts to make or maintain contact with the patient. The care management team is pleased to engage with this patient at any time in the future should he/she be interested in assistance from the care management team.   Follow Up Plan: A HIPPA compliant phone message was left for the patient providing contact information and requesting a return call.  The care management team is available to follow up with the patient after provider conversation with the patient regarding recommendation for care management engagement and subsequent re-referral to the care management team.   Noreene Larsson, [place], [place], [place] [address] Direct Dial: [phone] [email or web address] Website: [email or web address]

## 2019-09-16 ENCOUNTER — Telehealth: Payer: Self-pay | Admitting: Family Medicine

## 2019-09-16 NOTE — Chronic Care Management (AMB) (Signed)
Chronic Care Management   Note  09/16/2019 Name: Russell Byrd MRN: [id number] DOB: [birth year]  [name] is a 74 y.o. year old male who is a primary care patient of Crissman, Jeannette How, MD. I reached out to Ellison Hughs by phone today in response to a referral sent by Mr. Neng [facility] health plan.     Mr. Dupuis was given information about Chronic Care Management services today including:  1. CCM service includes personalized support from designated clinical staff supervised by his physician, including individualized plan of care and coordination with other care providers 2. 24/7 contact phone numbers for assistance for urgent and routine care needs. 3. Service will only be billed when office clinical staff spend 20 minutes or more in a month to coordinate care. 4. Only one practitioner may furnish and bill the service in a calendar month. 5. The patient may stop CCM services at any time (effective at the end of the month) by phone call to the office staff. 6. The patient will be responsible for cost sharing (co-pay) of up to 20% of the service fee (after annual deductible is met).  Patient did not agree to enrollment in care management services and does not wish to consider at this time.  Follow up plan: The patient has been provided with contact information for the care management team and has been advised to call with any health related questions or concerns.   Noreene Larsson, [place], [place], [place] [address] Direct Dial: [phone] [email or web address] Website: [email or web address]

## 2019-10-11 ENCOUNTER — Ambulatory Visit (INDEPENDENT_AMBULATORY_CARE_PROVIDER_SITE_OTHER): Payer: PPO | Admitting: Family Medicine

## 2019-10-11 ENCOUNTER — Encounter: Payer: Self-pay | Admitting: Family Medicine

## 2019-10-11 ENCOUNTER — Other Ambulatory Visit: Payer: Self-pay

## 2019-10-11 VITALS — BP 139/71 | HR 53 | Temp 98.6°F | Ht 67.0 in | Wt 179.0 lb

## 2019-10-11 DIAGNOSIS — E785 Hyperlipidemia, unspecified: Secondary | ICD-10-CM | POA: Diagnosis not present

## 2019-10-11 DIAGNOSIS — F419 Anxiety disorder, unspecified: Secondary | ICD-10-CM

## 2019-10-11 DIAGNOSIS — E1169 Type 2 diabetes mellitus with other specified complication: Secondary | ICD-10-CM

## 2019-10-11 DIAGNOSIS — I10 Essential (primary) hypertension: Secondary | ICD-10-CM | POA: Diagnosis not present

## 2019-10-11 DIAGNOSIS — E1159 Type 2 diabetes mellitus with other circulatory complications: Secondary | ICD-10-CM

## 2019-10-11 DIAGNOSIS — Z0000 Encounter for general adult medical examination without abnormal findings: Secondary | ICD-10-CM | POA: Diagnosis not present

## 2019-10-11 DIAGNOSIS — N400 Enlarged prostate without lower urinary tract symptoms: Secondary | ICD-10-CM | POA: Diagnosis not present

## 2019-10-11 MED ORDER — BENAZEPRIL HCL 40 MG PO TABS: 40.0000 mg | ORAL_TABLET | Freq: Every day | ORAL | 1 refills | Status: DC

## 2019-10-11 MED ORDER — HYDROCHLOROTHIAZIDE 25 MG PO TABS: 25.0000 mg | ORAL_TABLET | Freq: Every day | ORAL | 1 refills | Status: DC

## 2019-10-11 MED ORDER — LANSOPRAZOLE 30 MG PO CPDR: 30.0000 mg | DELAYED_RELEASE_CAPSULE | Freq: Every day | ORAL | 1 refills | Status: DC

## 2019-10-11 MED ORDER — METFORMIN HCL 500 MG PO TABS: 1000.0000 mg | ORAL_TABLET | Freq: Two times a day (BID) | ORAL | 1 refills | Status: DC

## 2019-10-11 MED ORDER — CLORAZEPATE DIPOTASSIUM 7.5 MG PO TABS: 7.5000 mg | ORAL_TABLET | Freq: Two times a day (BID) | ORAL | 0 refills | Status: DC | PRN

## 2019-10-11 MED ORDER — ATORVASTATIN CALCIUM 20 MG PO TABS: 20.0000 mg | ORAL_TABLET | Freq: Every day | ORAL | 1 refills | Status: DC

## 2019-10-11 MED ORDER — ATENOLOL 25 MG PO TABS: 25.0000 mg | ORAL_TABLET | Freq: Two times a day (BID) | ORAL | 1 refills | Status: DC

## 2019-10-11 NOTE — Progress Notes (Signed)
BP 139/71   Pulse (!) 53   Temp 98.6 F (37 C) (Oral)   Ht _0  (1.702 m)   Wt 179 lb (81.2 kg)   SpO2 100%   BMI 28.04 kg/m    Subjective:    Patient ID: Russell Byrd, male    DOB: [birth year], 74 y.o.   MRN: [id number]  HPI: Russell Byrd is a 74 y.o. male presenting on 10/11/2019 for comprehensive medical examination. Current medical complaints include:see below  HTN - Swelling improved since stopping amlodipine and starting HCTZ. Not checking home BPs. Denies CP, SOB, HAs, dizziness.   DM - Not checking home BSs. Taking metformin faithfully without side effects. Watches what he eats, does not exercise.   HLD - on lipitor, tolerating well. Denies claudication, myalgias.   BPH - Stable without new sxs.   Anxiety - has been managed on BID prn tranxene for many years now. This works well for him without side effects.   He currently lives with: Interim Problems from his last visit: no  Depression Screen done today and results listed below:  Depression screen [facility] 2/9 10/13/2017 04/14/2017 03/25/2016  Decreased Interest 0 0 0  Down, Depressed, Hopeless 0 0 1  PHQ - 2 Score 0 0 1    The patient does not have a history of falls. I did complete a risk assessment for falls. A plan of care for falls was documented.   Past Medical History:  Past Medical History:  Diagnosis Date  . Diabetes mellitus without complication ([place])   . History of kidney stones   . Hyperlipidemia   . Hypertension     Surgical History:  Past Surgical History:  Procedure Laterality Date  . KNEE SURGERY Bilateral    arthroscopic    Medications:  Current Outpatient Medications on File Prior to Visit  Medication Sig  . Acetaminophen (TYLENOL 8 HOUR PO) Take by mouth daily.  Marland Kitchen aspirin EC 81 MG tablet Take 81 mg by mouth daily.  . Misc Natural Products (OSTEO BI-FLEX/5-LOXIN ADVANCED PO) Take by mouth.  . Multiple Vitamins-Minerals (CENTRUM SILVER ADULT 50+) TABS Take by mouth daily.  .  vitamin E 400 UNIT capsule Take 400 Units by mouth daily.  . Zinc 50 MG CAPS Take 50 mg by mouth daily.  . blood glucose meter kit and supplies KIT Dispense based on patient and insurance preference; One touch. Use up to three times daily as directed. (FOR ICD-9 250.00, 250.01). (Patient not taking: Reported on 10/11/2019)   No current facility-administered medications on file prior to visit.    Allergies:  Allergies  Allergen Reactions  . Prednisone Other (See Comments)    steroids  . Tetracycline     Other reaction(s): Unknown  . Tetracyclines & Related   . Tramadol Other (See Comments)    Keeps him awake    Social History:  Social History   Socioeconomic History  . Marital status: Married    Spouse name: Not on file  . Number of children: Not on file  . Years of education: Not on file  . Highest education level: Not on file  Occupational History  . Not on file  Tobacco Use  . Smoking status: Never Smoker  . Smokeless tobacco: Current User    Types: Chew  Substance and Sexual Activity  . Alcohol use: Yes    Comment: rare  . Drug use: No  . Sexual activity: Not on file  Other Topics Concern  . Not  on file  Social History Narrative  . Not on file   Social Determinants of Health   Financial Resource Strain:   . Difficulty of Paying Living Expenses:   Food Insecurity:   . Worried About Charity fundraiser in the Last Year:   . Arboriculturist in the Last Year:   Transportation Needs:   . Film/video editor (Medical):   Marland Kitchen Lack of Transportation (Non-Medical):   Physical Activity:   . Days of Exercise per Week:   . Minutes of Exercise per Session:   Stress:   . Feeling of Stress :   Social Connections:   . Frequency of Communication with Friends and Family:   . Frequency of Social Gatherings with Friends and Family:   . Attends Religious Services:   . Active Member of Clubs or Organizations:   . Attends Archivist Meetings:   Marland Kitchen Marital Status:    Intimate Partner Violence:   . Fear of Current or Ex-Partner:   . Emotionally Abused:   Marland Kitchen Physically Abused:   . Sexually Abused:    Social History   Tobacco Use  Smoking Status Never Smoker  Smokeless Tobacco Current User  . Types: Chew   Social History   Substance and Sexual Activity  Alcohol Use Yes   Comment: rare    Family History:  Family History  Problem Relation Age of Onset  . Diabetes Father   . Heart disease Father   . Diabetes Mother     Past medical history, surgical history, medications, allergies, family history and social history reviewed with patient today and changes made to appropriate areas of the chart.   Review of Systems - General ROS: negative Psychological ROS: negative Ophthalmic ROS: negative ENT ROS: negative Allergy and Immunology ROS: negative Hematological and Lymphatic ROS: negative Endocrine ROS: negative Breast ROS: negative for breast lumps Respiratory ROS: no cough, shortness of breath, or wheezing Cardiovascular ROS: no chest pain or dyspnea on exertion Gastrointestinal ROS: no abdominal pain, change in bowel habits, or black or bloody stools Genito-Urinary ROS: no dysuria, trouble voiding, or hematuria Musculoskeletal ROS: negative Neurological ROS: no TIA or stroke symptoms Dermatological ROS: negative All other ROS negative except what is listed above and in the HPI.      Objective:    BP 139/71   Pulse (!) 53   Temp 98.6 F (37 C) (Oral)   Ht _0  (1.702 m)   Wt 179 lb (81.2 kg)   SpO2 100%   BMI 28.04 kg/m   Wt Readings from Last 3 Encounters:  10/11/19 179 lb (81.2 kg)  04/20/18 182 lb (82.6 kg)  10/13/17 182 lb (82.6 kg)    Physical Exam Vitals and nursing note reviewed.  Constitutional:      General: He is not in acute distress.    Appearance: He is well-developed.  HENT:     Head: Atraumatic.     Right Ear: Tympanic membrane and external ear normal.     Left Ear: Tympanic membrane and external ear  normal.     Nose: Nose normal.     Mouth/Throat:     Mouth: Mucous membranes are moist.     Pharynx: Oropharynx is clear.  Eyes:     General: No scleral icterus.    Conjunctiva/sclera: Conjunctivae normal.     Pupils: Pupils are equal, round, and reactive to light.  Cardiovascular:     Rate and Rhythm: Normal rate and regular rhythm.  Heart sounds: Normal heart sounds. No murmur.  Pulmonary:     Effort: Pulmonary effort is normal. No respiratory distress.     Breath sounds: Normal breath sounds.  Abdominal:     General: Bowel sounds are normal. There is no distension.     Palpations: Abdomen is soft. There is no mass.     Tenderness: There is no abdominal tenderness. There is no guarding.  Genitourinary:    Comments: Declines GU exam  Musculoskeletal:        General: No tenderness. Normal range of motion.     Cervical back: Normal range of motion and neck supple.  Skin:    General: Skin is warm and dry.     Findings: No rash.  Neurological:     General: No focal deficit present.     Mental Status: He is alert and oriented to person, place, and time.     Deep Tendon Reflexes: Reflexes are normal and symmetric.  Psychiatric:        Mood and Affect: Mood normal.        Behavior: Behavior normal.        Thought Content: Thought content normal.        Judgment: Judgment normal.    Diabetic Foot Exam - Simple   Simple Foot Form Diabetic Foot exam was performed with the following findings: Yes 10/11/2019  4:31 PM  Visual Inspection No deformities, no ulcerations, no other skin breakdown bilaterally: Yes Sensation Testing Intact to touch and monofilament testing bilaterally: Yes Pulse Check Posterior Tibialis and Dorsalis pulse intact bilaterally: Yes Comments     Results for orders placed or performed in visit on 10/11/19  Microscopic Examination   URINE  Result Value Ref Range   WBC, UA None seen 0 - 5 /hpf   RBC None seen 0 - 2 /hpf   Epithelial Cells (non renal)  0-10 0 - 10 /hpf   Bacteria, UA None seen None seen/Few  CBC with Differential/Platelet  Result Value Ref Range   WBC 10.6 3.4 - 10.8 x10E3/uL   RBC 4.10 (L) 4.14 - 5.80 x10E6/uL   Hemoglobin 12.8 (L) 13.0 - 17.7 g/dL   Hematocrit 36.5 (L) 37.5 - 51.0 %   MCV 89 79 - 97 fL   MCH 31.2 26.6 - 33.0 pg   MCHC 35.1 31.5 - 35.7 g/dL   RDW 12.2 11.6 - 15.4 %   Platelets 199 150 - 450 x10E3/uL   Neutrophils 73 Not Estab. %   Lymphs 18 Not Estab. %   Monocytes 7 Not Estab. %   Eos 1 Not Estab. %   Basos 0 Not Estab. %   Neutrophils Absolute 7.6 (H) 1.4 - 7.0 x10E3/uL   Lymphocytes Absolute 1.9 0.7 - 3.1 x10E3/uL   Monocytes Absolute 0.8 0.1 - 0.9 x10E3/uL   EOS (ABSOLUTE) 0.2 0.0 - 0.4 x10E3/uL   Basophils Absolute 0.0 0.0 - 0.2 x10E3/uL   Immature Granulocytes 1 Not Estab. %   Immature Grans (Abs) 0.1 0.0 - 0.1 x10E3/uL  Comprehensive metabolic panel  Result Value Ref Range   Glucose 143 (H) 65 - 99 mg/dL   BUN 19 8 - 27 mg/dL   Creatinine, Ser 1.14 0.76 - 1.27 mg/dL   GFR calc non Af Amer 63 >59 mL/min/1.73   GFR calc Af Amer 73 >59 mL/min/1.73   BUN/Creatinine Ratio 17 10 - 24   Sodium 139 134 - 144 mmol/L   Potassium 4.9 3.5 - 5.2 mmol/L  Chloride 101 96 - 106 mmol/L   CO2 24 20 - 29 mmol/L   Calcium 9.7 8.6 - 10.2 mg/dL   Total Protein 7.1 6.0 - 8.5 g/dL   Albumin 4.5 3.7 - 4.7 g/dL   Globulin, Total 2.6 1.5 - 4.5 g/dL   Albumin/Globulin Ratio 1.7 1.2 - 2.2   Bilirubin Total 0.4 0.0 - 1.2 mg/dL   Alkaline Phosphatase 92 39 - 117 IU/L   AST 14 0 - 40 IU/L   ALT 16 0 - 44 IU/L  Lipid Panel w/o Chol/HDL Ratio  Result Value Ref Range   Cholesterol, Total 121 100 - 199 mg/dL   Triglycerides 163 (H) 0 - 149 mg/dL   HDL 32 (L) >39 mg/dL   VLDL Cholesterol Cal 28 5 - 40 mg/dL   LDL Chol Calc (NIH) 61 0 - 99 mg/dL  UA/M w/rflx Culture, Routine   Specimen: Urine   URINE  Result Value Ref Range   Specific Gravity, UA 1.015 1.005 - 1.030   pH, UA 5.0 5.0 - 7.5   Color, UA  Yellow Yellow   Appearance Ur Clear Clear   Leukocytes,UA Negative Negative   Protein,UA 1+ (A) Negative/Trace   Glucose, UA Negative Negative   Ketones, UA Negative Negative   RBC, UA Negative Negative   Bilirubin, UA Negative Negative   Urobilinogen, Ur 0.2 0.2 - 1.0 mg/dL   Nitrite, UA Negative Negative   Microscopic Examination See below:   Microalbumin, Urine Waived  Result Value Ref Range   Microalb, Ur Waived 150 (H) 0 - 19 mg/L   Creatinine, Urine Waived 50 10 - 300 mg/dL   Microalb/Creat Ratio >300 (H) <30 mg/g  HgB A1c  Result Value Ref Range   Hgb A1c MFr Bld 7.6 (H) 4.8 - 5.6 %   Est. average glucose Bld gHb Est-mCnc 171 mg/dL  PSA  Result Value Ref Range   Prostate Specific Ag, Serum 0.3 0.0 - 4.0 ng/mL      Assessment & Plan:   Problem List Items Addressed This Visit      Cardiovascular and Mediastinum   Hypertension associated with diabetes ([place]) - Primary    Stable and well controlled, continue current regimen      Relevant Medications   atenolol (TENORMIN) 25 MG tablet   atorvastatin (LIPITOR) 20 MG tablet   benazepril (LOTENSIN) 40 MG tablet   hydrochlorothiazide (HYDRODIURIL) 25 MG tablet   metFORMIN (GLUCOPHAGE) 500 MG tablet   Other Relevant Orders   CBC with Differential/Platelet (Completed)   Comprehensive metabolic panel (Completed)     Endocrine   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 Continue current regimen and good lifestyle habits      Relevant Medications   atorvastatin (LIPITOR) 20 MG tablet   benazepril (LOTENSIN) 40 MG tablet   metFORMIN (GLUCOPHAGE) 500 MG tablet   Other Relevant Orders   UA/M w/rflx Culture, Routine (Completed)   Microalbumin, Urine Waived (Completed)   HgB A1c (Completed)     Genitourinary   BPH (benign prostatic hyperplasia)    Recheck PSA, continue to monitor. Asymptomatic      Relevant Orders   PSA     Other   Hyperlipidemia    Recheck  lipids, adjust as needed. Continue current regimen      Relevant Medications   atenolol (TENORMIN) 25 MG tablet   atorvastatin (LIPITOR) 20 MG tablet   benazepril (LOTENSIN) 40 MG tablet   hydrochlorothiazide (  HYDRODIURIL) 25 MG tablet   Other Relevant Orders   Lipid Panel w/o Chol/HDL Ratio (Completed)   Chronic anxiety    Stable and well controlled for years on current regimen, continue with close monitoring every 3 months      Relevant Medications   clorazepate (TRANXENE) 7.5 MG tablet    Other Visit Diagnoses    Annual physical exam       Essential hypertension       Relevant Medications   atenolol (TENORMIN) 25 MG tablet   atorvastatin (LIPITOR) 20 MG tablet   benazepril (LOTENSIN) 40 MG tablet   hydrochlorothiazide (HYDRODIURIL) 25 MG tablet       Discussed aspirin prophylaxis for myocardial infarction prevention and decision was made to continue ASA  LABORATORY TESTING:  Health maintenance labs ordered today as discussed above.   The natural history of prostate cancer and ongoing controversy regarding screening and potential treatment outcomes of prostate cancer has been discussed with the patient. The meaning of a false positive PSA and a false negative PSA has been discussed. He indicates understanding of the limitations of this screening test and wishes to proceed with screening PSA testing.   IMMUNIZATIONS:   - Tdap: Tetanus vaccination status reviewed: postponed. - Influenza: Up to date - Pneumovax: Up to date - Prevnar: Up to date - HPV: Not applicable - Zostavax vaccine: Refused  SCREENING: - Colonoscopy: Refused  Discussed with patient purpose of the colonoscopy is to detect colon cancer at curable precancerous or early stages   PATIENT COUNSELING:    Sexuality: Discussed sexually transmitted diseases, partner selection, use of condoms, avoidance of unintended pregnancy  and contraceptive alternatives.   Advised to avoid cigarette smoking.  I  discussed with the patient that most people either abstain from alcohol or drink within safe limits (<=14/week and <=4 drinks/occasion for males, <=7/weeks and <= 3 drinks/occasion for females) and that the risk for alcohol disorders and other health effects rises proportionally with the number of drinks per week and how often a drinker exceeds daily limits.  Discussed cessation/primary prevention of drug use and availability of treatment for abuse.   Diet: Encouraged to adjust caloric intake to maintain  or achieve ideal body weight, to reduce intake of dietary saturated fat and total fat, to limit sodium intake by avoiding high sodium foods and not adding table salt, and to maintain adequate dietary potassium and calcium preferably from fresh fruits, vegetables, and low-fat dairy products.    stressed the importance of regular exercise  Injury prevention: Discussed safety belts, safety helmets, smoke detector, smoking near bedding or upholstery.   Dental health: Discussed importance of regular tooth brushing, flossing, and dental visits.   Follow up plan: NEXT PREVENTATIVE PHYSICAL DUE IN 1 YEAR. Return in about 3 months (around 01/10/2020) for Anxiety f/u.

## 2019-10-12 LAB — UA/M W/RFLX CULTURE, ROUTINE
Bilirubin, UA: NEGATIVE
Glucose, UA: NEGATIVE
Ketones, UA: NEGATIVE
Leukocytes,UA: NEGATIVE
Nitrite, UA: NEGATIVE
RBC, UA: NEGATIVE
Specific Gravity, UA: 1.015 (ref 1.005–1.030)
Urobilinogen, Ur: 0.2 mg/dL (ref 0.2–1.0)
pH, UA: 5 (ref 5.0–7.5)

## 2019-10-12 LAB — CBC WITH DIFFERENTIAL/PLATELET
Basophils Absolute: 0 10*3/uL (ref 0.0–0.2)
Basos: 0 %
EOS (ABSOLUTE): 0.2 10*3/uL (ref 0.0–0.4)
Eos: 1 %
Hematocrit: 36.5 % — ABNORMAL LOW (ref 37.5–51.0)
Hemoglobin: 12.8 g/dL — ABNORMAL LOW (ref 13.0–17.7)
Immature Grans (Abs): 0.1 10*3/uL (ref 0.0–0.1)
Immature Granulocytes: 1 %
Lymphocytes Absolute: 1.9 10*3/uL (ref 0.7–3.1)
Lymphs: 18 %
MCH: 31.2 pg (ref 26.6–33.0)
MCHC: 35.1 g/dL (ref 31.5–35.7)
MCV: 89 fL (ref 79–97)
Monocytes Absolute: 0.8 10*3/uL (ref 0.1–0.9)
Monocytes: 7 %
Neutrophils Absolute: 7.6 10*3/uL — ABNORMAL HIGH (ref 1.4–7.0)
Neutrophils: 73 %
Platelets: 199 10*3/uL (ref 150–450)
RBC: 4.1 x10E6/uL — ABNORMAL LOW (ref 4.14–5.80)
RDW: 12.2 % (ref 11.6–15.4)
WBC: 10.6 10*3/uL (ref 3.4–10.8)

## 2019-10-12 LAB — COMPREHENSIVE METABOLIC PANEL
ALT: 16 IU/L (ref 0–44)
AST: 14 IU/L (ref 0–40)
Albumin/Globulin Ratio: 1.7 (ref 1.2–2.2)
Albumin: 4.5 g/dL (ref 3.7–4.7)
Alkaline Phosphatase: 92 IU/L (ref 39–117)
BUN/Creatinine Ratio: 17 (ref 10–24)
BUN: 19 mg/dL (ref 8–27)
Bilirubin Total: 0.4 mg/dL (ref 0.0–1.2)
CO2: 24 mmol/L (ref 20–29)
Calcium: 9.7 mg/dL (ref 8.6–10.2)
Chloride: 101 mmol/L (ref 96–106)
Creatinine, Ser: 1.14 mg/dL (ref 0.76–1.27)
GFR calc Af Amer: 73 mL/min/{1.73_m2} (ref >59)
GFR calc non Af Amer: 63 mL/min/{1.73_m2} (ref >59)
Globulin, Total: 2.6 g/dL (ref 1.5–4.5)
Glucose: 143 mg/dL — ABNORMAL HIGH (ref 65–99)
Potassium: 4.9 mmol/L (ref 3.5–5.2)
Sodium: 139 mmol/L (ref 134–144)
Total Protein: 7.1 g/dL (ref 6.0–8.5)

## 2019-10-12 LAB — MICROSCOPIC EXAMINATION
Bacteria, UA: NONE SEEN
RBC, Urine: NONE SEEN /hpf (ref 0–2)
WBC, UA: NONE SEEN /hpf (ref 0–5)

## 2019-10-12 LAB — LIPID PANEL W/O CHOL/HDL RATIO
Cholesterol, Total: 121 mg/dL (ref 100–199)
HDL: 32 mg/dL — ABNORMAL LOW (ref >39)
LDL Chol Calc (NIH): 61 mg/dL (ref 0–99)
Triglycerides: 163 mg/dL — ABNORMAL HIGH (ref 0–149)
VLDL Cholesterol Cal: 28 mg/dL (ref 5–40)

## 2019-10-12 LAB — HEMOGLOBIN A1C
Est. average glucose Bld gHb Est-mCnc: 171 mg/dL
Hgb A1c MFr Bld: 7.6 % — ABNORMAL HIGH (ref 4.8–5.6)

## 2019-10-12 LAB — PSA: Prostate Specific Ag, Serum: 0.3 ng/mL (ref 0.0–4.0)

## 2019-10-12 LAB — MICROALBUMIN, URINE WAIVED
Creatinine, Urine Waived: 50 mg/dL (ref 10–300)
Microalb, Ur Waived: 150 mg/L — ABNORMAL HIGH (ref 0–19)
Microalb/Creat Ratio: >300 mg/g — ABNORMAL HIGH (ref <30)

## 2019-10-15 ENCOUNTER — Encounter: Payer: Self-pay | Admitting: Family Medicine

## 2019-10-18 NOTE — Assessment & Plan Note (Signed)
Stable and well controlled for years on current regimen, continue with close monitoring every 3 months

## 2019-10-18 NOTE — Assessment & Plan Note (Signed)
Recheck PSA, continue to monitor. Asymptomatic 

## 2019-10-18 NOTE — Assessment & Plan Note (Signed)
Stable and well controlled, continue current regimen 

## 2019-10-18 NOTE — Assessment & Plan Note (Signed)
Recheck A1C, adjust as needed. Continue current regimen and good lifestyle habits 

## 2019-10-18 NOTE — Assessment & Plan Note (Signed)
Recheck lipids, adjust as needed. Continue current regimen 

## 2019-12-27 ENCOUNTER — Telehealth: Payer: Self-pay | Admitting: Family Medicine

## 2019-12-27 NOTE — Telephone Encounter (Signed)
Copied from [place] [id number]. Topic: Medicare AWV >> Dec 27, 2019  2:06 PM Cher Nakai R wrote: Called patient to schedule Medicare Annual Wellness Visit with Nurse Health Advisor.   If patient returns call, please schedule for any date.  Last AWV completed:   Appointment length should be 45 minutes  Thank you,  Colletta [place] [phone]

## 2020-01-17 ENCOUNTER — Ambulatory Visit (INDEPENDENT_AMBULATORY_CARE_PROVIDER_SITE_OTHER): Payer: PPO | Admitting: Family Medicine

## 2020-01-17 DIAGNOSIS — F419 Anxiety disorder, unspecified: Secondary | ICD-10-CM

## 2020-01-17 MED ORDER — CLORAZEPATE DIPOTASSIUM 7.5 MG PO TABS: 7.5000 mg | ORAL_TABLET | Freq: Two times a day (BID) | ORAL | 0 refills | Status: DC | PRN

## 2020-01-17 NOTE — Progress Notes (Signed)
There were no vitals taken for this visit.   Subjective:    Patient ID: Russell Byrd, male    DOB: [birth year], 74 y.o.   MRN: [id number]  HPI: Russell Byrd is a 74 y.o. male  Chief Complaint  Patient presents with  . Anxiety    . This visit was completed via telephone due to the restrictions of the COVID-19 pandemic. All issues as above were discussed and addressed. Physical exam was done as above through visual confirmation on telephone. If it was felt that the patient should be evaluated in the office, they were directed there. The patient verbally consented to this visit. . Location of the patient: home . Location of the provider: work . Those involved with this call:  . Provider: Merrie Roof, PA-C . CMA: Lesle Chris, [place] . Front Desk/Registration: Don Perking  . Time spent on call: 15 minutes on the phone discussing health concerns. 5 minutes total spent in review of patient's record and preparation of their chart. I verified patient identity using two factors (patient name and date of birth). Patient consents verbally to being seen via telemedicine visit today.   Presenting today for anxiety f/u. Has been well managed on BID tranxene for his anxiety condition for years now. Declines daily preventative medication for this. Denies side effects to the medication, mood concerns, SI/HI.   Depression screen [facility] 2/9 01/17/2020 10/13/2017 04/14/2017  Decreased Interest 0 0 0  Down, Depressed, Hopeless 0 0 0  PHQ - 2 Score 0 0 0  Altered sleeping 0 - -  Tired, decreased energy 0 - -  Change in appetite 0 - -  Feeling bad or failure about yourself  0 - -  Trouble concentrating 0 - -  Moving slowly or fidgety/restless 0 - -  PHQ-9 Score 0 - -   GAD 7 : Generalized Anxiety Score 10/11/2019  Nervous, Anxious, on Edge 0  Control/stop worrying 0  Worry too much - different things 0  Trouble relaxing 0  Restless 0  Easily annoyed or irritable 0  Afraid - awful might  happen 0  Total GAD 7 Score 0  Anxiety Difficulty Not difficult at all   Relevant past medical, surgical, family and social history reviewed and updated as indicated. Interim medical history since our last visit reviewed. Allergies and medications reviewed and updated.  Review of Systems  Per HPI unless specifically indicated above     Objective:    There were no vitals taken for this visit.  Wt Readings from Last 3 Encounters:  10/11/19 179 lb (81.2 kg)  04/20/18 182 lb (82.6 kg)  10/13/17 182 lb (82.6 kg)    Physical Exam  Unable to perform PE due to patient lack of access to video technology for today's visit  Results for orders placed or performed in visit on 10/11/19  Microscopic Examination   URINE  Result Value Ref Range   WBC, UA None seen 0 - 5 /hpf   RBC None seen 0 - 2 /hpf   Epithelial Cells (non renal) 0-10 0 - 10 /hpf   Bacteria, UA None seen None seen/Few  CBC with Differential/Platelet  Result Value Ref Range   WBC 10.6 3.4 - 10.8 x10E3/uL   RBC 4.10 (L) 4.14 - 5.80 x10E6/uL   Hemoglobin 12.8 (L) 13.0 - 17.7 g/dL   Hematocrit 36.5 (L) 37.5 - 51.0 %   MCV 89 79 - 97 fL   MCH 31.2 26.6 - 33.0 pg  MCHC 35.1 31 - 35 g/dL   RDW 12.2 11.6 - 15.4 %   Platelets 199 150 - 450 x10E3/uL   Neutrophils 73 Not Estab. %   Lymphs 18 Not Estab. %   Monocytes 7 Not Estab. %   Eos 1 Not Estab. %   Basos 0 Not Estab. %   Neutrophils Absolute 7.6 (H) 1 - 7 x10E3/uL   Lymphocytes Absolute 1.9 0 - 3 x10E3/uL   Monocytes Absolute 0.8 0 - 0 x10E3/uL   EOS (ABSOLUTE) 0.2 0.0 - 0.4 x10E3/uL   Basophils Absolute 0.0 0 - 0 x10E3/uL   Immature Granulocytes 1 Not Estab. %   Immature Grans (Abs) 0.1 0.0 - 0.1 x10E3/uL  Comprehensive metabolic panel  Result Value Ref Range   Glucose 143 (H) 65 - 99 mg/dL   BUN 19 8 - 27 mg/dL   Creatinine, Ser 1.14 0.76 - 1.27 mg/dL   GFR calc non Af Amer 63 >59 mL/min/1.73   GFR calc Af Amer 73 >59 mL/min/1.73   BUN/Creatinine Ratio 17  10 - 24   Sodium 139 134 - 144 mmol/L   Potassium 4.9 3.5 - 5.2 mmol/L   Chloride 101 96 - 106 mmol/L   CO2 24 20 - 29 mmol/L   Calcium 9.7 8.6 - 10.2 mg/dL   Total Protein 7.1 6.0 - 8.5 g/dL   Albumin 4.5 3.7 - 4.7 g/dL   Globulin, Total 2.6 1.5 - 4.5 g/dL   Albumin/Globulin Ratio 1.7 1.2 - 2.2   Bilirubin Total 0.4 0.0 - 1.2 mg/dL   Alkaline Phosphatase 92 39 - 117 IU/L   AST 14 0 - 40 IU/L   ALT 16 0 - 44 IU/L  Lipid Panel w/o Chol/HDL Ratio  Result Value Ref Range   Cholesterol, Total 121 100 - 199 mg/dL   Triglycerides 163 (H) 0 - 149 mg/dL   HDL 32 (L) >39 mg/dL   VLDL Cholesterol Cal 28 5 - 40 mg/dL   LDL Chol Calc (NIH) 61 0 - 99 mg/dL  UA/M w/rflx Culture, Routine   Specimen: Urine   URINE  Result Value Ref Range   Specific Gravity, UA 1.015 1.005 - 1.030   pH, UA 5.0 5.0 - 7.5   Color, UA Yellow Yellow   Appearance Ur Clear Clear   Leukocytes,UA Negative Negative   Protein,UA 1+ (A) Negative/Trace   Glucose, UA Negative Negative   Ketones, UA Negative Negative   RBC, UA Negative Negative   Bilirubin, UA Negative Negative   Urobilinogen, Ur 0.2 0.2 - 1.0 mg/dL   Nitrite, UA Negative Negative   Microscopic Examination See below:   Microalbumin, Urine Waived  Result Value Ref Range   Microalb, Ur Waived 150 (H) 0 - 19 mg/L   Creatinine, Urine Waived 50 10 - 300 mg/dL   Microalb/Creat Ratio >300 (H) <30 mg/g  HgB A1c  Result Value Ref Range   Hgb A1c MFr Bld 7.6 (H) 4.8 - 5.6 %   Est. average glucose Bld gHb Est-mCnc 171 mg/dL  PSA  Result Value Ref Range   Prostate Specific Ag, Serum 0.3 0.0 - 4.0 ng/mL      Assessment & Plan:   Problem List Items Addressed This Visit      Other   Chronic anxiety - Primary    Chronic, stable and well controlled on tranxene, continue current regimen      Relevant Medications   clorazepate (TRANXENE) 7.5 MG tablet       Follow  up plan: Return in about 3 months (around 04/18/2020) for 6 month f/u.

## 2020-01-20 NOTE — Assessment & Plan Note (Signed)
Chronic, stable and well controlled on tranxene, continue current regimen

## 2020-01-24 ENCOUNTER — Telehealth: Payer: Self-pay | Admitting: Family Medicine

## 2020-01-24 NOTE — Telephone Encounter (Signed)
Patient declined the Medicare Wellness Visit with [facility]  Stated he does not have time and feels he does not need

## 2020-01-26 ENCOUNTER — Telehealth: Payer: Self-pay | Admitting: Family Medicine

## 2020-01-26 NOTE — Telephone Encounter (Signed)
-----   Message from Volney American, [place] sent at 01/20/2020 12:06 AM EDT ----- 3 months for 6 month f/u

## 2020-01-26 NOTE — Telephone Encounter (Signed)
lvm to make this apt.  

## 2020-02-02 NOTE — Telephone Encounter (Signed)
LVM TO MAKE APT 

## 2020-02-08 NOTE — Telephone Encounter (Signed)
Made apt for 04/17/20 pt verbalized understanding.

## 2020-02-08 NOTE — Telephone Encounter (Signed)
Unable to lvm to make this apt sent letter.

## 2020-04-17 ENCOUNTER — Other Ambulatory Visit: Payer: PPO

## 2020-04-17 ENCOUNTER — Ambulatory Visit (INDEPENDENT_AMBULATORY_CARE_PROVIDER_SITE_OTHER): Payer: PPO | Admitting: Family Medicine

## 2020-04-17 ENCOUNTER — Other Ambulatory Visit: Payer: Self-pay

## 2020-04-17 ENCOUNTER — Encounter: Payer: Self-pay | Admitting: Family Medicine

## 2020-04-17 VITALS — BP 137/74 | HR 51 | Temp 98.8°F | Resp 16 | Ht 67.0 in | Wt 179.0 lb

## 2020-04-17 DIAGNOSIS — Z23 Encounter for immunization: Secondary | ICD-10-CM

## 2020-04-17 DIAGNOSIS — E119 Type 2 diabetes mellitus without complications: Secondary | ICD-10-CM | POA: Diagnosis not present

## 2020-04-17 DIAGNOSIS — E1169 Type 2 diabetes mellitus with other specified complication: Secondary | ICD-10-CM

## 2020-04-17 DIAGNOSIS — E785 Hyperlipidemia, unspecified: Secondary | ICD-10-CM

## 2020-04-17 DIAGNOSIS — F419 Anxiety disorder, unspecified: Secondary | ICD-10-CM | POA: Diagnosis not present

## 2020-04-17 DIAGNOSIS — M1711 Unilateral primary osteoarthritis, right knee: Secondary | ICD-10-CM

## 2020-04-17 DIAGNOSIS — I152 Hypertension secondary to endocrine disorders: Secondary | ICD-10-CM

## 2020-04-17 DIAGNOSIS — E1159 Type 2 diabetes mellitus with other circulatory complications: Secondary | ICD-10-CM

## 2020-04-17 DIAGNOSIS — I10 Essential (primary) hypertension: Secondary | ICD-10-CM

## 2020-04-17 DIAGNOSIS — N400 Enlarged prostate without lower urinary tract symptoms: Secondary | ICD-10-CM | POA: Diagnosis not present

## 2020-04-17 MED ORDER — DICLOFENAC SODIUM 1 % EX GEL: 4.0000 g | Freq: Four times a day (QID) | CUTANEOUS | 6 refills | Status: DC

## 2020-04-17 MED ORDER — ATORVASTATIN CALCIUM 20 MG PO TABS
20.0000 mg | ORAL_TABLET | Freq: Every day | ORAL | 1 refills | Status: DC
Start: 2020-04-17 — End: 2020-09-18

## 2020-04-17 MED ORDER — ATENOLOL 25 MG PO TABS
25.0000 mg | ORAL_TABLET | Freq: Two times a day (BID) | ORAL | 1 refills | Status: DC
Start: 2020-04-17 — End: 2020-09-18

## 2020-04-17 MED ORDER — CLORAZEPATE DIPOTASSIUM 7.5 MG PO TABS: 7.5000 mg | ORAL_TABLET | Freq: Two times a day (BID) | ORAL | 0 refills | Status: DC | PRN

## 2020-04-17 MED ORDER — LANSOPRAZOLE 30 MG PO CPDR
30.0000 mg | DELAYED_RELEASE_CAPSULE | Freq: Every day | ORAL | 1 refills | Status: DC
Start: 2020-04-17 — End: 2020-09-18

## 2020-04-17 MED ORDER — HYDROCHLOROTHIAZIDE 25 MG PO TABS: 25.0000 mg | ORAL_TABLET | Freq: Every day | ORAL | 1 refills | Status: DC

## 2020-04-17 MED ORDER — METFORMIN HCL 500 MG PO TABS
1000.0000 mg | ORAL_TABLET | Freq: Two times a day (BID) | ORAL | 1 refills | Status: DC
Start: 2020-04-17 — End: 2020-09-18

## 2020-04-17 MED ORDER — BENAZEPRIL HCL 40 MG PO TABS
40.0000 mg | ORAL_TABLET | Freq: Every day | ORAL | 1 refills | Status: DC
Start: 2020-04-17 — End: 2020-09-18

## 2020-04-17 MED ORDER — BLOOD GLUCOSE MONITOR KIT: PACK | 12 refills | Status: AC

## 2020-04-17 NOTE — Assessment & Plan Note (Signed)
Under good control on current regimen. Continue current regimen. Continue to monitor. Call with any concerns. Refills given. Labs drawn today.   

## 2020-04-17 NOTE — Assessment & Plan Note (Signed)
Under good control on current regimen. Continue current regimen. Continue to monitor. Call with any concerns. Refills given for 3 months. Follow up 3 months. Discussed that I do not send controlled substances to mail order. Rx sent to local pharmacy. Recheck 3 months.

## 2020-04-17 NOTE — Assessment & Plan Note (Signed)
Will start voltaren. Call with any concerns. Continue to monitor.

## 2020-04-17 NOTE — Progress Notes (Signed)
BP 137/74   Pulse (!) 51   Temp 98.8 F (37.1 C) (Oral)   Resp 16   Ht '5\' 7"'  (1.702 m)   Wt 179 lb (81.2 kg)   SpO2 98%   BMI 28.04 kg/m    Subjective:    Patient ID: Russell Byrd, male    DOB: June 17, 1946, 74 y.o.   MRN: [id number]  HPI: Russell Byrd is a 74 y.o. male  Chief Complaint  Patient presents with  . Hypertension   HYPERTENSION / [place] Satisfied with current treatment? yes Duration of hypertension: chronic BP monitoring frequency: not checking BP medication side effects: no Past BP meds: HCTZ, atenolol, benazepril Duration of hyperlipidemia: chronic Cholesterol medication side effects: no Cholesterol supplements: none Past cholesterol medications: atorvastatin Medication compliance: excellent compliance Aspirin: yes Recent stressors: no Recurrent headaches: no Visual changes: no Palpitations: no Dyspnea: no Chest pain: no Lower extremity edema: no Dizzy/lightheaded: no  DIABETES Hypoglycemic episodes:no Polydipsia/polyuria: no Visual disturbance: no Chest pain: no Paresthesias: no Glucose Monitoring: no  Accucheck frequency: not checking Taking Insulin?: no Blood Pressure Monitoring: not checking Retinal Examination: Not up to Date Foot Exam: Up to Date Diabetic Education: Completed Pneumovax: Up to Date Influenza: Up to Date Aspirin: yes   ANXIETY/STRESS Duration:stable Anxious mood: yes  Excessive worrying: yes Irritability: no  Sweating: no Nausea: no Palpitations:no Hyperventilation: no Panic attacks: no Agoraphobia: no  Obscessions/compulsions: no Depressed mood: no Depression screen [facility] 2/9 04/17/2020 01/17/2020 10/13/2017 04/14/2017 03/25/2016  Decreased Interest 0 0 0 0 0  Down, Depressed, Hopeless 0 0 0 0 1  PHQ - 2 Score 0 0 0 0 1  Altered sleeping - 0 - - -  Tired, decreased energy - 0 - - -  Change in appetite - 0 - - -  Feeling bad or failure about yourself  - 0 - - -  Trouble concentrating - 0 - - -   Moving slowly or fidgety/restless - 0 - - -  PHQ-9 Score - 0 - - -   Anhedonia: no Weight changes: no Insomnia: no   Hypersomnia: no Fatigue/loss of energy: no Feelings of worthlessness: no Feelings of guilt: no Impaired concentration/indecisiveness: no Suicidal ideations: no  Crying spells: no Recent Stressors/Life Changes: no   Relationship problems: no   Family stress: no     Financial stress: no    Job stress: no    Recent death/loss: no   Relevant past medical, surgical, family and social history reviewed and updated as indicated. Interim medical history since our last visit reviewed. Allergies and medications reviewed and updated.  Review of Systems  Constitutional: Negative.   Respiratory: Negative.   Cardiovascular: Negative.   Gastrointestinal: Negative.   Musculoskeletal: Negative.   Psychiatric/Behavioral: Negative for agitation, behavioral problems, confusion, decreased concentration, dysphoric mood, hallucinations, self-injury, sleep disturbance and suicidal ideas. The patient is nervous/anxious. The patient is not hyperactive.     Per HPI unless specifically indicated above     Objective:    BP 137/74   Pulse (!) 51   Temp 98.8 F (37.1 C) (Oral)   Resp 16   Ht '5\' 7"'  (1.702 m)   Wt 179 lb (81.2 kg)   SpO2 98%   BMI 28.04 kg/m   Wt Readings from Last 3 Encounters:  04/17/20 179 lb (81.2 kg)  10/11/19 179 lb (81.2 kg)  04/20/18 182 lb (82.6 kg)    Physical Exam Vitals and nursing note reviewed.  Constitutional:  General: He is not in acute distress.    Appearance: Normal appearance. He is not ill-appearing, toxic-appearing or diaphoretic.  HENT:     Head: Normocephalic and atraumatic.     Right Ear: External ear normal.     Left Ear: External ear normal.     Nose: Nose normal.     Mouth/Throat:     Mouth: Mucous membranes are moist.     Pharynx: Oropharynx is clear.  Eyes:     General: No scleral icterus.       Right eye: No  discharge.        Left eye: No discharge.     Extraocular Movements: Extraocular movements intact.     Conjunctiva/sclera: Conjunctivae normal.     Pupils: Pupils are equal, round, and reactive to light.  Cardiovascular:     Rate and Rhythm: Normal rate and regular rhythm.     Pulses: Normal pulses.     Heart sounds: Normal heart sounds. No murmur heard.  No friction rub. No gallop.   Pulmonary:     Effort: Pulmonary effort is normal. No respiratory distress.     Breath sounds: Normal breath sounds. No stridor. No wheezing, rhonchi or rales.  Chest:     Chest wall: No tenderness.  Musculoskeletal:        General: Normal range of motion.     Cervical back: Normal range of motion and neck supple.  Skin:    General: Skin is warm and dry.     Capillary Refill: Capillary refill takes less than 2 seconds.     Coloration: Skin is not jaundiced or pale.     Findings: No bruising, erythema, lesion or rash.  Neurological:     General: No focal deficit present.     Mental Status: He is alert and oriented to person, place, and time. Mental status is at baseline.  Psychiatric:        Mood and Affect: Mood is anxious.        Behavior: Behavior normal.        Thought Content: Thought content normal.        Judgment: Judgment normal.     Results for orders placed or performed in visit on 10/11/19  Microscopic Examination   URINE  Result Value Ref Range   WBC, UA None seen 0 - 5 /hpf   RBC None seen 0 - 2 /hpf   Epithelial Cells (non renal) 0-10 0 - 10 /hpf   Bacteria, UA None seen None seen/Few  CBC with Differential/Platelet  Result Value Ref Range   WBC 10.6 3.4 - 10.8 x10E3/uL   RBC 4.10 (L) 4.14 - 5.80 x10E6/uL   Hemoglobin 12.8 (L) 13.0 - 17.7 g/dL   Hematocrit 36.5 (L) 37.5 - 51.0 %   MCV 89 79 - 97 fL   MCH 31.2 26.6 - 33.0 pg   MCHC 35.1 31 - 35 g/dL   RDW 12.2 11.6 - 15.4 %   Platelets 199 150 - 450 x10E3/uL   Neutrophils 73 Not Estab. %   Lymphs 18 Not Estab. %    Monocytes 7 Not Estab. %   Eos 1 Not Estab. %   Basos 0 Not Estab. %   Neutrophils Absolute 7.6 (H) 1 - 7 x10E3/uL   Lymphocytes Absolute 1.9 0 - 3 x10E3/uL   Monocytes Absolute 0.8 0 - 0 x10E3/uL   EOS (ABSOLUTE) 0.2 0.0 - 0.4 x10E3/uL   Basophils Absolute 0.0 0 - 0 x10E3/uL  Immature Granulocytes 1 Not Estab. %   Immature Grans (Abs) 0.1 0.0 - 0.1 x10E3/uL  Comprehensive metabolic panel  Result Value Ref Range   Glucose 143 (H) 65 - 99 mg/dL   BUN 19 8 - 27 mg/dL   Creatinine, Ser 1.14 0.76 - 1.27 mg/dL   GFR calc non Af Amer 63 >59 mL/min/1.73   GFR calc Af Amer 73 >59 mL/min/1.73   BUN/Creatinine Ratio 17 10 - 24   Sodium 139 134 - 144 mmol/L   Potassium 4.9 3.5 - 5.2 mmol/L   Chloride 101 96 - 106 mmol/L   CO2 24 20 - 29 mmol/L   Calcium 9.7 8.6 - 10.2 mg/dL   Total Protein 7.1 6.0 - 8.5 g/dL   Albumin 4.5 3.7 - 4.7 g/dL   Globulin, Total 2.6 1.5 - 4.5 g/dL   Albumin/Globulin Ratio 1.7 1.2 - 2.2   Bilirubin Total 0.4 0.0 - 1.2 mg/dL   Alkaline Phosphatase 92 39 - 117 IU/L   AST 14 0 - 40 IU/L   ALT 16 0 - 44 IU/L  Lipid Panel w/o Chol/HDL Ratio  Result Value Ref Range   Cholesterol, Total 121 100 - 199 mg/dL   Triglycerides 163 (H) 0 - 149 mg/dL   HDL 32 (L) >39 mg/dL   VLDL Cholesterol Cal 28 5 - 40 mg/dL   LDL Chol Calc (NIH) 61 0 - 99 mg/dL  UA/M w/rflx Culture, Routine   Specimen: Urine   URINE  Result Value Ref Range   Specific Gravity, UA 1.015 1.005 - 1.030   pH, UA 5.0 5.0 - 7.5   Color, UA Yellow Yellow   Appearance Ur Clear Clear   Leukocytes,UA Negative Negative   Protein,UA 1+ (A) Negative/Trace   Glucose, UA Negative Negative   Ketones, UA Negative Negative   RBC, UA Negative Negative   Bilirubin, UA Negative Negative   Urobilinogen, Ur 0.2 0.2 - 1.0 mg/dL   Nitrite, UA Negative Negative   Microscopic Examination See below:   Microalbumin, Urine Waived  Result Value Ref Range   Microalb, Ur Waived 150 (H) 0 - 19 mg/L   Creatinine, Urine  Waived 50 10 - 300 mg/dL   Microalb/Creat Ratio >300 (H) <30 mg/g  HgB A1c  Result Value Ref Range   Hgb A1c MFr Bld 7.6 (H) 4.8 - 5.6 %   Est. average glucose Bld gHb Est-mCnc 171 mg/dL  PSA  Result Value Ref Range   Prostate Specific Ag, Serum 0.3 0.0 - 4.0 ng/mL      Assessment & Plan:   Problem List Items Addressed This Visit      Cardiovascular and Mediastinum   Hypertension associated with diabetes ([place]) - Primary    Under good control on current regimen. Continue current regimen. Continue to monitor. Call with any concerns. Refills given. Labs drawn today.        Relevant Medications   metFORMIN (GLUCOPHAGE) 500 MG tablet   hydrochlorothiazide (HYDRODIURIL) 25 MG tablet   benazepril (LOTENSIN) 40 MG tablet   atorvastatin (LIPITOR) 20 MG tablet   atenolol (TENORMIN) 25 MG tablet   Other Relevant Orders   CBC with Differential/Platelet   Comprehensive metabolic panel   Microalbumin, Urine Waived   TSH   Urinalysis, Routine w reflex microscopic     Endocrine   Diabetes mellitus associated with hormonal etiology ([place])    Doing well with A1c of 7.2 down from 7.6. Continue current regimen. Continue to monitor. Call with any concerns.  Relevant Medications   metFORMIN (GLUCOPHAGE) 500 MG tablet   benazepril (LOTENSIN) 40 MG tablet   atorvastatin (LIPITOR) 20 MG tablet   blood glucose meter kit and supplies KIT   Other Relevant Orders   Bayer DCA Hb A1c Waived   CBC with Differential/Platelet   Comprehensive metabolic panel   Microalbumin, Urine Waived   TSH     Musculoskeletal and Integument   Primary osteoarthritis of right knee    Will start voltaren. Call with any concerns. Continue to monitor.         Genitourinary   BPH (benign prostatic hyperplasia)    Under good control on current regimen. Continue current regimen. Continue to monitor. Call with any concerns. Refills given. Labs drawn today.        Relevant Orders   CBC with  Differential/Platelet   Comprehensive metabolic panel   PSA   TSH   Urinalysis, Routine w reflex microscopic     Other   Hyperlipidemia    Under good control on current regimen. Continue current regimen. Continue to monitor. Call with any concerns. Refills given. Labs drawn today.        Relevant Medications   hydrochlorothiazide (HYDRODIURIL) 25 MG tablet   benazepril (LOTENSIN) 40 MG tablet   atorvastatin (LIPITOR) 20 MG tablet   atenolol (TENORMIN) 25 MG tablet   Other Relevant Orders   CBC with Differential/Platelet   Comprehensive metabolic panel   Lipid Panel w/o Chol/HDL Ratio   TSH   Chronic anxiety    Under good control on current regimen. Continue current regimen. Continue to monitor. Call with any concerns. Refills given for 3 months. Follow up 3 months. Discussed that I do not send controlled substances to mail order. Rx sent to local pharmacy. Recheck 3 months.        Relevant Medications   clorazepate (TRANXENE) 7.5 MG tablet (Start on 05/07/2020)    Other Visit Diagnoses    Needs flu shot       Flu shot given today.    Relevant Orders   Flu Vaccine QUAD High Dose(Fluad) (Completed)   Diabetes mellitus without complication (H[facility])       Relevant Medications   metFORMIN (GLUCOPHAGE) 500 MG tablet   benazepril (LOTENSIN) 40 MG tablet   atorvastatin (LIPITOR) 20 MG tablet   Essential hypertension       Relevant Medications   hydrochlorothiazide (HYDRODIURIL) 25 MG tablet   benazepril (LOTENSIN) 40 MG tablet   atorvastatin (LIPITOR) 20 MG tablet   atenolol (TENORMIN) 25 MG tablet       Follow up plan: Return Before 08/05/20 virtual visit, 6 months physical.

## 2020-04-17 NOTE — Assessment & Plan Note (Signed)
Doing well with A1c of 7.2 down from 7.6. Continue current regimen. Continue to monitor. Call with any concerns.

## 2020-04-18 LAB — CBC WITH DIFFERENTIAL/PLATELET
Basophils Absolute: 0.1 10*3/uL (ref 0.0–0.2)
Basos: 1 %
EOS (ABSOLUTE): 0.1 10*3/uL (ref 0.0–0.4)
Eos: 2 %
Hematocrit: 36.4 % — ABNORMAL LOW (ref 37.5–51.0)
Hemoglobin: 12.5 g/dL — ABNORMAL LOW (ref 13.0–17.7)
Immature Grans (Abs): 0.1 10*3/uL (ref 0.0–0.1)
Immature Granulocytes: 1 %
Lymphocytes Absolute: 1.6 10*3/uL (ref 0.7–3.1)
Lymphs: 17 %
MCH: 30.6 pg (ref 26.6–33.0)
MCHC: 34.3 g/dL (ref 31.5–35.7)
MCV: 89 fL (ref 79–97)
Monocytes Absolute: 0.8 10*3/uL (ref 0.1–0.9)
Monocytes: 8 %
Neutrophils Absolute: 6.7 10*3/uL (ref 1.4–7.0)
Neutrophils: 71 %
Platelets: 202 10*3/uL (ref 150–450)
RBC: 4.09 x10E6/uL — ABNORMAL LOW (ref 4.14–5.80)
RDW: 12.2 % (ref 11.6–15.4)
WBC: 9.3 10*3/uL (ref 3.4–10.8)

## 2020-04-18 LAB — COMPREHENSIVE METABOLIC PANEL
ALT: 14 IU/L (ref 0–44)
AST: 14 IU/L (ref 0–40)
Albumin/Globulin Ratio: 1.8 (ref 1.2–2.2)
Albumin: 4.4 g/dL (ref 3.7–4.7)
Alkaline Phosphatase: 84 IU/L (ref 44–121)
BUN/Creatinine Ratio: 18 (ref 10–24)
BUN: 21 mg/dL (ref 8–27)
Bilirubin Total: 0.4 mg/dL (ref 0.0–1.2)
CO2: 25 mmol/L (ref 20–29)
Calcium: 9.4 mg/dL (ref 8.6–10.2)
Chloride: 98 mmol/L (ref 96–106)
Creatinine, Ser: 1.14 mg/dL (ref 0.76–1.27)
GFR calc Af Amer: 73 mL/min/{1.73_m2} (ref >59)
GFR calc non Af Amer: 63 mL/min/{1.73_m2} (ref >59)
Globulin, Total: 2.4 g/dL (ref 1.5–4.5)
Glucose: 157 mg/dL — ABNORMAL HIGH (ref 65–99)
Potassium: 4.3 mmol/L (ref 3.5–5.2)
Sodium: 137 mmol/L (ref 134–144)
Total Protein: 6.8 g/dL (ref 6.0–8.5)

## 2020-04-18 LAB — URINALYSIS, ROUTINE W REFLEX MICROSCOPIC
Bilirubin, UA: NEGATIVE
Glucose, UA: NEGATIVE
Ketones, UA: NEGATIVE
Leukocytes,UA: NEGATIVE
Nitrite, UA: NEGATIVE
RBC, UA: NEGATIVE
Specific Gravity, UA: 1.015 (ref 1.005–1.030)
Urobilinogen, Ur: 0.2 mg/dL (ref 0.2–1.0)
pH, UA: 5.5 (ref 5.0–7.5)

## 2020-04-18 LAB — MICROSCOPIC EXAMINATION
Bacteria, UA: NONE SEEN
RBC, Urine: NONE SEEN /hpf (ref 0–2)
WBC, UA: NONE SEEN /hpf (ref 0–5)

## 2020-04-18 LAB — LIPID PANEL W/O CHOL/HDL RATIO
Cholesterol, Total: 116 mg/dL (ref 100–199)
HDL: 33 mg/dL — ABNORMAL LOW (ref >39)
LDL Chol Calc (NIH): 62 mg/dL (ref 0–99)
Triglycerides: 111 mg/dL (ref 0–149)
VLDL Cholesterol Cal: 21 mg/dL (ref 5–40)

## 2020-04-18 LAB — MICROALBUMIN, URINE WAIVED
Creatinine, Urine Waived: 100 mg/dL (ref 10–300)
Microalb, Ur Waived: 150 mg/L — ABNORMAL HIGH (ref 0–19)
Microalb/Creat Ratio: >300 mg/g — ABNORMAL HIGH (ref <30)

## 2020-04-18 LAB — PSA: Prostate Specific Ag, Serum: 0.3 ng/mL (ref 0.0–4.0)

## 2020-04-18 LAB — TSH: TSH: 3.18 u[IU]/mL (ref 0.450–4.500)

## 2020-04-18 LAB — BAYER DCA HB A1C WAIVED: HB A1C (BAYER DCA - WAIVED): 7.3 % — ABNORMAL HIGH (ref <7.0)

## 2020-04-19 ENCOUNTER — Encounter: Payer: Self-pay | Admitting: Family Medicine

## 2020-04-19 ENCOUNTER — Telehealth: Payer: Self-pay | Admitting: Family Medicine

## 2020-04-19 NOTE — Telephone Encounter (Signed)
Pt called in for assistance. Pt says that his pharmacy has not received Rx for blood glucose meter kit and supplies KIT     Please assist

## 2020-04-20 NOTE — Telephone Encounter (Signed)
RX was printed. Signed by Dr. Wynetta Emery.  Called and spoke to patient. Let him know what happened. Patient requested RX to be faxed to [facility]. RX faxed as requested by the patient.

## 2020-05-05 ENCOUNTER — Other Ambulatory Visit: Payer: Self-pay | Admitting: Family Medicine

## 2020-05-05 NOTE — Telephone Encounter (Signed)
Pharmacy needs script sent over for one touch ultra 2 lancets and test strips.

## 2020-05-05 NOTE — Telephone Encounter (Signed)
Pt needs lancets and test strips, he needs a Rx for them to go along with his meter. Pt does not have any at all  [id number] - [place], [place] - [address]  [address] [place] [address]  Phone: [phone] Fax: [phone]   Pt would like a call back [phone], he would like a VM when his Rx is available

## 2020-05-09 NOTE — Telephone Encounter (Signed)
Order form is in Dr. Durenda Age signature folder. Will fax as soon as it is signed.

## 2020-05-09 NOTE — Telephone Encounter (Signed)
Patient is calling again to get his lancets and test strips.  He has requested it several times and the pharmacy says they still do not have the request.  Please advise and call patient to confirm at [phone]

## 2020-05-10 NOTE — Telephone Encounter (Signed)
Called pt advised order is waiting on provider to sign

## 2020-05-10 NOTE — Telephone Encounter (Signed)
RX signed by Dr. Wynetta Emery and faxed to the pharmacy. Called and notified patient that this was done.

## 2020-07-18 ENCOUNTER — Ambulatory Visit (INDEPENDENT_AMBULATORY_CARE_PROVIDER_SITE_OTHER): Payer: PPO | Admitting: Family Medicine

## 2020-07-18 ENCOUNTER — Encounter: Payer: Self-pay | Admitting: Family Medicine

## 2020-07-18 DIAGNOSIS — F419 Anxiety disorder, unspecified: Secondary | ICD-10-CM | POA: Diagnosis not present

## 2020-07-18 DIAGNOSIS — R2689 Other abnormalities of gait and mobility: Secondary | ICD-10-CM | POA: Diagnosis not present

## 2020-07-18 MED ORDER — CLORAZEPATE DIPOTASSIUM 7.5 MG PO TABS: 7.5000 mg | ORAL_TABLET | Freq: Two times a day (BID) | ORAL | 0 refills | Status: AC | PRN

## 2020-07-18 NOTE — Assessment & Plan Note (Signed)
Patient does not want to come off his medicine. Having issues with balance. Would do well not on a benzodiazepine. Long discussion again with patient that he cannot have this medicine through the mail order as it is a controlled substance and can only get 3 months at a time. He has enough to last him until 1/29, but was very concerned about being able to get to [place] to pick up his medicine. Refills sent to his pharmacy. Continue to monitor. Follow up in 3 months.

## 2020-07-18 NOTE — Progress Notes (Signed)
There were no vitals taken for this visit.   Subjective:    Patient ID: Russell Byrd, male    DOB: 1946-05-26, 75 y.o.   MRN: [id number]  HPI: Russell Byrd is a 75 y.o. male  Chief Complaint  Patient presents with  . Anxiety    3 month f/up- pt requesting to have refill sent to mail order if possible   He notes that for over a year he's been having trouble with his balance. When he leans forward he will over compensate and lose his balance. He has fallen 4x in the last year and 1x in the last month. He states that he has no help and cannot button his shirts due to his hand. He was advised to get a knee replacement, but was not able to because he was anxious about the after care.   ANXIETY/STRESS Duration: chronic Status:stable Anxious mood: yes  Excessive worrying: no Irritability: no  Sweating: no Nausea: no Palpitations:no Hyperventilation: no Panic attacks: no Agoraphobia: no  Obscessions/compulsions: no Depressed mood: no Depression screen [facility] 2/9 07/18/2020 04/17/2020 01/17/2020 10/13/2017 04/14/2017  Decreased Interest 0 0 0 0 0  Down, Depressed, Hopeless 0 0 0 0 0  PHQ - 2 Score 0 0 0 0 0  Altered sleeping 0 - 0 - -  Tired, decreased energy 0 - 0 - -  Change in appetite 0 - 0 - -  Feeling bad or failure about yourself  0 - 0 - -  Trouble concentrating 0 - 0 - -  Moving slowly or fidgety/restless 0 - 0 - -  Suicidal thoughts 0 - - - -  PHQ-9 Score 0 - 0 - -  Difficult doing work/chores Not difficult at all - - - -   GAD 7 : Generalized Anxiety Score 07/18/2020 10/11/2019  Nervous, Anxious, on Edge 0 0  Control/stop worrying 0 0  Worry too much - different things 0 0  Trouble relaxing 0 0  Restless 0 0  Easily annoyed or irritable 0 0  Afraid - awful might happen 0 0  Total GAD 7 Score 0 0  Anxiety Difficulty Not difficult at all Not difficult at all   Anhedonia: no Weight changes: no Insomnia: no   Hypersomnia: no Fatigue/loss of energy: no Feelings  of worthlessness: no Feelings of guilt: no Impaired concentration/indecisiveness: no Suicidal ideations: no  Crying spells: no Recent Stressors/Life Changes: no   Relationship problems: no   Family stress: no     Financial stress: no    Job stress: no    Recent death/loss: no   Relevant past medical, surgical, family and social history reviewed and updated as indicated. Interim medical history since our last visit reviewed. Allergies and medications reviewed and updated.  Review of Systems  Constitutional: Negative.   Respiratory: Negative.   Cardiovascular: Negative.   Gastrointestinal: Negative.   Musculoskeletal: Negative.   Neurological: Negative for tremors, syncope and weakness.  Psychiatric/Behavioral: Negative.    Per HPI unless specifically indicated above     Objective:    There were no vitals taken for this visit.  Wt Readings from Last 3 Encounters:  04/17/20 179 lb (81.2 kg)  10/11/19 179 lb (81.2 kg)  04/20/18 182 lb (82.6 kg)    Physical Exam Vitals and nursing note reviewed.  Pulmonary:     Effort: Pulmonary effort is normal. No respiratory distress.     Comments: Speaking in full sentences Neurological:     Mental Status: He  is alert.  Psychiatric:        Mood and Affect: Mood normal.        Behavior: Behavior normal.        Thought Content: Thought content normal.        Judgment: Judgment normal.     Results for orders placed or performed in visit on 04/17/20  Microscopic Examination   BLD  Result Value Ref Range   WBC, UA None seen 0 - 5 /hpf   RBC None seen 0 - 2 /hpf   Epithelial Cells (non renal) 0-10 0 - 10 /hpf   Bacteria, UA None seen None seen/Few  Bayer DCA Hb A1c Waived  Result Value Ref Range   HB A1C (BAYER DCA - WAIVED) 7.3 (H) <7.0 %  CBC with Differential/Platelet  Result Value Ref Range   WBC 9.3 3.4 - 10.8 x10E3/uL   RBC 4.09 (L) 4.14 - 5.80 x10E6/uL   Hemoglobin 12.5 (L) 13.0 - 17.7 g/dL   Hematocrit 36.4 (L) 37.5  - 51.0 %   MCV 89 79 - 97 fL   MCH 30.6 26.6 - 33.0 pg   MCHC 34.3 31.5 - 35.7 g/dL   RDW 12.2 11.6 - 15.4 %   Platelets 202 150 - 450 x10E3/uL   Neutrophils 71 Not Estab. %   Lymphs 17 Not Estab. %   Monocytes 8 Not Estab. %   Eos 2 Not Estab. %   Basos 1 Not Estab. %   Neutrophils Absolute 6.7 1.4 - 7.0 x10E3/uL   Lymphocytes Absolute 1.6 0.7 - 3.1 x10E3/uL   Monocytes Absolute 0.8 0.1 - 0.9 x10E3/uL   EOS (ABSOLUTE) 0.1 0.0 - 0.4 x10E3/uL   Basophils Absolute 0.1 0.0 - 0.2 x10E3/uL   Immature Granulocytes 1 Not Estab. %   Immature Grans (Abs) 0.1 0.0 - 0.1 x10E3/uL  Comprehensive metabolic panel  Result Value Ref Range   Glucose 157 (H) 65 - 99 mg/dL   BUN 21 8 - 27 mg/dL   Creatinine, Ser 1.14 0.76 - 1.27 mg/dL   GFR calc non Af Amer 63 >59 mL/min/1.73   GFR calc Af Amer 73 >59 mL/min/1.73   BUN/Creatinine Ratio 18 10 - 24   Sodium 137 134 - 144 mmol/L   Potassium 4.3 3.5 - 5.2 mmol/L   Chloride 98 96 - 106 mmol/L   CO2 25 20 - 29 mmol/L   Calcium 9.4 8.6 - 10.2 mg/dL   Total Protein 6.8 6.0 - 8.5 g/dL   Albumin 4.4 3.7 - 4.7 g/dL   Globulin, Total 2.4 1.5 - 4.5 g/dL   Albumin/Globulin Ratio 1.8 1.2 - 2.2   Bilirubin Total 0.4 0.0 - 1.2 mg/dL   Alkaline Phosphatase 84 44 - 121 IU/L   AST 14 0 - 40 IU/L   ALT 14 0 - 44 IU/L  Lipid Panel w/o Chol/HDL Ratio  Result Value Ref Range   Cholesterol, Total 116 100 - 199 mg/dL   Triglycerides 111 0 - 149 mg/dL   HDL 33 (L) >39 mg/dL   VLDL Cholesterol Cal 21 5 - 40 mg/dL   LDL Chol Calc (NIH) 62 0 - 99 mg/dL  Microalbumin, Urine Waived  Result Value Ref Range   Microalb, Ur Waived 150 (H) 0 - 19 mg/L   Creatinine, Urine Waived 100 10 - 300 mg/dL   Microalb/Creat Ratio >300 (H) <30 mg/g  PSA  Result Value Ref Range   Prostate Specific Ag, Serum 0.3 0.0 - 4.0 ng/mL  TSH  Result Value Ref Range   TSH 3.180 0.450 - 4.500 uIU/mL  Urinalysis, Routine w reflex microscopic  Result Value Ref Range   Specific Gravity, UA  1.015 1.005 - 1.030   pH, UA 5.5 5.0 - 7.5   Color, UA Yellow Yellow   Appearance Ur Clear Clear   Leukocytes,UA Negative Negative   Protein,UA 1+ (A) Negative/Trace   Glucose, UA Negative Negative   Ketones, UA Negative Negative   RBC, UA Negative Negative   Bilirubin, UA Negative Negative   Urobilinogen, Ur 0.2 0.2 - 1.0 mg/dL   Nitrite, UA Negative Negative   Microscopic Examination See below:       Assessment & Plan:   Problem List Items Addressed This Visit      Other   Chronic anxiety    Patient does not want to come off his medicine. Having issues with balance. Would do well not on a benzodiazepine. Long discussion again with patient that he cannot have this medicine through the mail order as it is a controlled substance and can only get 3 months at a time. He has enough to last him until 1/29, but was very concerned about being able to get to [place] to pick up his medicine. Refills sent to his pharmacy. Continue to monitor. Follow up in 3 months.       Relevant Medications   clorazepate (TRANXENE) 7.5 MG tablet    Other Visit Diagnoses    Balance problem    -  Primary   Likely exacerbated by clorazepate. Refuses PT and home health. If he changes his mind, call and we can arrange for help. Continue to monitor.        Follow up plan: Return in about 3 months (around 10/16/2020) for With Dr. Neomia Dear.      . This visit was completed via telephone due to the restrictions of the COVID-19 pandemic. All issues as above were discussed and addressed but no physical exam was performed. If it was felt that the patient should be evaluated in the office, they were directed there. The patient verbally consented to this visit. Patient was unable to complete an audio/visual visit due to Lack of equipment. Due to the catastrophic nature of the COVID-19 pandemic, this visit was done through audio contact only. . Location of the patient: home . Location of the provider: home . Those  involved with this call:  . Provider: Park Liter, DO . CMA: Yvonna Alanis, [place] . Front Desk/Registration: Jill Side  . Time spent on call: 21 minutes on the phone discussing health concerns. 30 minutes total spent in review of patient's record and preparation of their chart.

## 2020-09-18 ENCOUNTER — Other Ambulatory Visit: Payer: Self-pay

## 2020-09-18 DIAGNOSIS — E1159 Type 2 diabetes mellitus with other circulatory complications: Secondary | ICD-10-CM

## 2020-09-18 DIAGNOSIS — I152 Hypertension secondary to endocrine disorders: Secondary | ICD-10-CM

## 2020-09-19 MED ORDER — ATORVASTATIN CALCIUM 20 MG PO TABS
20.0000 mg | ORAL_TABLET | Freq: Every day | ORAL | 1 refills | Status: DC
Start: 2020-09-19 — End: 2021-02-05

## 2020-09-19 MED ORDER — HYDROCHLOROTHIAZIDE 25 MG PO TABS: 25.0000 mg | ORAL_TABLET | Freq: Every day | ORAL | 1 refills | Status: DC

## 2020-09-19 MED ORDER — ATENOLOL 25 MG PO TABS
25.0000 mg | ORAL_TABLET | Freq: Two times a day (BID) | ORAL | 1 refills | Status: DC
Start: 2020-09-19 — End: 2021-02-05

## 2020-09-19 MED ORDER — BENAZEPRIL HCL 40 MG PO TABS
40.0000 mg | ORAL_TABLET | Freq: Every day | ORAL | 1 refills | Status: DC
Start: 2020-09-19 — End: 2021-02-05

## 2020-09-19 MED ORDER — METFORMIN HCL 500 MG PO TABS
1000.0000 mg | ORAL_TABLET | Freq: Two times a day (BID) | ORAL | 1 refills | Status: DC
Start: 2020-09-19 — End: 2021-02-05

## 2020-09-19 MED ORDER — LANSOPRAZOLE 30 MG PO CPDR
30.0000 mg | DELAYED_RELEASE_CAPSULE | Freq: Every day | ORAL | 1 refills | Status: DC
Start: 2020-09-19 — End: 2021-02-05

## 2020-10-09 ENCOUNTER — Telehealth: Payer: Self-pay

## 2020-10-09 NOTE — Telephone Encounter (Signed)
Copied from [place] [id number]. Topic: Appointment Scheduling - Scheduling Inquiry for Clinic >> Oct 09, 2020  4:10 PM Yvette Rack wrote: Reason for CRM: Pt requests to schedule an appt for a physical with Dr. Wynetta Emery on a Monday late afternoon around 3-4 pm. Attempted to schedule appt with pt pcp but pt declined each appt that was offered. Pt insists on being seen by Dr. Wynetta Emery because she is familiar with him and his medical history. Pt requests call back   Please advise.

## 2020-10-10 ENCOUNTER — Other Ambulatory Visit: Payer: Self-pay | Admitting: Family Medicine

## 2020-10-10 NOTE — Telephone Encounter (Signed)
Patient was last seen in office on 07/18/20 and patient does not have upcoming appointment scheduled.

## 2020-10-10 NOTE — Telephone Encounter (Signed)
I did not see anywhere in his chart state that he has seen anyone in Psych

## 2020-10-10 NOTE — Telephone Encounter (Signed)
Pl see if seen psych

## 2020-10-10 NOTE — Telephone Encounter (Signed)
Requested medication (s) are due for refill today: yes  Requested medication (s) are on the active medication list: yes  Last refill:  07/18/2020  Future visit scheduled: no  Notes to clinic: this refill cannot be delegated    Requested Prescriptions  Pending Prescriptions Disp Refills   clorazepate (TRANXENE) 7.5 MG tablet [Pharmacy Med Name: CLORAZEPATE 7.5 MG TABLET] 180 tablet 0    Sig: Take 1 tablet (7.5 mg total) by mouth 2 (two) times daily as needed for anxiety.      Not Delegated - Psychiatry:  Anxiolytics/Hypnotics Failed - 10/10/2020  2:10 PM      Failed - This refill cannot be delegated      Failed - Urine Drug Screen completed in last 360 days      Passed - Valid encounter within last 6 months    Recent Outpatient Visits           2 months ago Balance problem   [place], [place], DO   5 months ago Hypertension associated with diabetes [facility])   [place], [place], DO   8 months ago Chronic anxiety   [facility] Merrie Roof [place], [place]   1 year ago Hypertension associated with diabetes [facility])   [facility] [place], [place]   1 year ago Essential hypertension   [facility] [place], Jeannette How, [place]

## 2020-10-16 ENCOUNTER — Telehealth: Payer: Self-pay | Admitting: Internal Medicine

## 2020-10-16 NOTE — Telephone Encounter (Signed)
Requested medication (s) are due for refill today: yes  Requested medication (s) are on the active medication list: yes  Last refill:  07/18/20-10/16/20 #180 0 refills  Future visit scheduled: yes in 4 weeks  Notes to clinic:  not delegated per protocol     Requested Prescriptions  Pending Prescriptions Disp Refills   clorazepate (TRANXENE) 7.5 MG tablet 180 tablet 0    Sig: Take 1 tablet (7.5 mg total) by mouth 2 (two) times daily as needed for anxiety.      Not Delegated - Psychiatry:  Anxiolytics/Hypnotics Failed - 10/16/2020  4:51 PM      Failed - This refill cannot be delegated      Failed - Urine Drug Screen completed in last 360 days      Failed - Valid encounter within last 6 months    Recent Outpatient Visits           3 months ago Balance problem   [place], Megan P, DO   6 months ago Hypertension associated with diabetes [facility])   [place], [place], DO   9 months ago Chronic anxiety   [facility] Merrie Roof [place], [place]   1 year ago Hypertension associated with diabetes [facility])   [facility] Volney American, [place]   1 year ago Essential hypertension   [place], [place]       Future Appointments             In 4 weeks Wynetta Emery, Barb Merino, DO [facility], [facility]

## 2020-10-16 NOTE — Telephone Encounter (Signed)
Patient called in and fussed about getting an appointment with Dr Wynetta Emery. I scheduled him for 11/13/20 at 4 PM hope that is ok but told him if Dr Wynetta Emery declined to see him he has to see Dr Neomia Dear. Please advise  Ph# [phone]

## 2020-10-16 NOTE — Telephone Encounter (Signed)
Copied from [place] [id number]. Topic: Quick Communication - Rx Refill/Question >> Oct 16, 2020  4:10 PM Leward Quan A wrote: Medication: clorazepate (TRANXENE) 7.5 MG tablet  Say that Rx need to be faxed in to the pharmacy since it require a signature Has the patient contacted their pharmacy? Yes.   (Agent: If no, request that the patient contact the pharmacy for the refill.) (Agent: If yes, when and what did the pharmacy advise?)  Preferred Pharmacy (with phone number or street name): [id number] - [place], [address]  Phone:  [phone] Fax:  [phone]     Agent: Please be advised that RX refills may take up to 3 business days. We ask that you follow-up with your pharmacy.

## 2020-10-17 NOTE — Telephone Encounter (Signed)
Pt was suppose to Return in about 3 months (around 10/16/2020) for With Dr. Neomia Dear. Per last office note and Last refill:  07/18/20-10/16/20 #180 0 refills

## 2020-10-17 NOTE — Telephone Encounter (Signed)
[facility] scheduled pt for physical did he need to come in before for the refill or can refill be sent ? Apt on 11/13/2020

## 2020-10-18 NOTE — Telephone Encounter (Signed)
Called to schedule no answer left vm  

## 2020-10-18 NOTE — Telephone Encounter (Signed)
Needs to be seen every 3 months for that Rx. Needs appt

## 2020-10-18 NOTE — Telephone Encounter (Signed)
Called pt he did not want to have an appt. Advised to pt in order to get refill he would need an appt. Finally got pt to agree with speaking to provider over the phone 4/18

## 2020-10-18 NOTE — Telephone Encounter (Signed)
Dr.Vigg states patient would need to be seen sooner before refill.

## 2020-10-18 NOTE — Telephone Encounter (Signed)
Checking to see if Dr.Johnson would like to keep him as a pt or if he would need to schedule with Dr.vigg

## 2020-10-18 NOTE — Telephone Encounter (Signed)
Pt would like to know if Dr.Johnson would take him on as a new pt?

## 2020-10-18 NOTE — Telephone Encounter (Signed)
Pt was upset that he has to schedule an appt, declined a virtual appt, pt said he is not coming up for an appt just for a medication,  wants to know why he needs to be seen.

## 2020-10-18 NOTE — Telephone Encounter (Signed)
That's fine

## 2020-10-18 NOTE — Telephone Encounter (Signed)
Called Russell Byrd to let him know that Dr Wynetta Emery would take him on. Russell Byrd is saying that he needs a refill on his clorazepate. Please advise.

## 2020-10-19 NOTE — Telephone Encounter (Signed)
This is a controlled substance, and therefore he will need to be seen every 3 months to get refills on it. I'm happy to send it in, but he will need to be seen for it.

## 2020-10-23 ENCOUNTER — Ambulatory Visit (INDEPENDENT_AMBULATORY_CARE_PROVIDER_SITE_OTHER): Payer: PPO | Admitting: Family Medicine

## 2020-10-23 ENCOUNTER — Encounter: Payer: Self-pay | Admitting: Family Medicine

## 2020-10-23 DIAGNOSIS — F419 Anxiety disorder, unspecified: Secondary | ICD-10-CM | POA: Diagnosis not present

## 2020-10-23 MED ORDER — CLORAZEPATE DIPOTASSIUM 7.5 MG PO TABS: 7.5000 mg | ORAL_TABLET | Freq: Two times a day (BID) | ORAL | 2 refills | Status: DC | PRN

## 2020-10-23 MED ORDER — CLORAZEPATE DIPOTASSIUM 7.5 MG PO TABS: 7.5000 mg | ORAL_TABLET | Freq: Two times a day (BID) | ORAL | 0 refills | Status: DC | PRN

## 2020-10-23 NOTE — Progress Notes (Signed)
There were no vitals taken for this visit.   Subjective:    Patient ID: Russell Byrd, male    DOB: 1945-07-25, 75 y.o.   MRN: [id number]  HPI: Russell Byrd is a 75 y.o. male  Chief Complaint  Patient presents with  . Anxiety    Requesting medication refill clorazepate    ANXIETY/STRESS- has been on chlorazepate for 40 years. He notes that he has never tried anything else. Dr. Jeananne Rama once tried to change him to lorazepam and he had a panic attack and never tried anything again. He does not note any anxiety or depression. He takes his medicine scheduled every day and does not notice any issues with anxiety because he notes that he takes his medicine prior to having any. He has never tried not taking his medicine. He notes that he continues with a lot of arthritis and issues with his balance. He notes that he lives alone and has a lot of difficulty getting into town to pick up his medicine. He is not happy about having to pick up his medicine. He does not want to come into the office. He does not want to sign a controlled substance agreement. He does not want to change any of his medication or try anything else.  Duration:stable Anxious mood: no  Excessive worrying: no Irritability: no  Sweating: no Nausea: no Palpitations:no Hyperventilation: no Panic attacks: no Agoraphobia: no  Obscessions/compulsions: no Depressed mood: no Depression screen [facility] 2/9 10/23/2020 07/18/2020 04/17/2020 01/17/2020 10/13/2017  Decreased Interest 0 0 0 0 0  Down, Depressed, Hopeless 0 0 0 0 0  PHQ - 2 Score 0 0 0 0 0  Altered sleeping 0 0 - 0 -  Tired, decreased energy 0 0 - 0 -  Change in appetite 0 0 - 0 -  Feeling bad or failure about yourself  0 0 - 0 -  Trouble concentrating 0 0 - 0 -  Moving slowly or fidgety/restless 0 0 - 0 -  Suicidal thoughts 0 0 - - -  PHQ-9 Score 0 0 - 0 -  Difficult doing work/chores - Not difficult at all - - -   GAD 7 : Generalized Anxiety Score 10/23/2020  07/18/2020 10/11/2019  Nervous, Anxious, on Edge 0 0 0  Control/stop worrying 0 0 0  Worry too much - different things 0 0 0  Trouble relaxing 0 0 0  Restless 0 0 0  Easily annoyed or irritable 0 0 0  Afraid - awful might happen 0 0 0  Total GAD 7 Score 0 0 0  Anxiety Difficulty Not difficult at all Not difficult at all Not difficult at all   Anhedonia: no Weight changes: no Insomnia: no   Hypersomnia: no Fatigue/loss of energy: no Feelings of worthlessness: no Feelings of guilt: no Impaired concentration/indecisiveness: no Suicidal ideations: no  Crying spells: no Recent Stressors/Life Changes: no   Relationship problems: no   Family stress: no     Financial stress: no    Job stress: no    Recent death/loss: no  Relevant past medical, surgical, family and social history reviewed and updated as indicated. Interim medical history since our last visit reviewed. Allergies and medications reviewed and updated.  Review of Systems  Constitutional: Negative.   Respiratory: Negative.   Cardiovascular: Negative.   Gastrointestinal: Negative.   Musculoskeletal: Negative.   Psychiatric/Behavioral: Negative for agitation, behavioral problems, confusion, decreased concentration, dysphoric mood, hallucinations, self-injury, sleep disturbance and suicidal ideas. The patient  is not nervous/anxious and is not hyperactive.     Per HPI unless specifically indicated above     Objective:    There were no vitals taken for this visit.  Wt Readings from Last 3 Encounters:  04/17/20 179 lb (81.2 kg)  10/11/19 179 lb (81.2 kg)  04/20/18 182 lb (82.6 kg)    Physical Exam Vitals and nursing note reviewed.  Pulmonary:     Effort: Pulmonary effort is normal. No respiratory distress.     Comments: Speaking in full sentences Neurological:     Mental Status: He is alert.  Psychiatric:        Mood and Affect: Mood normal.        Behavior: Behavior normal.        Thought Content: Thought  content normal.        Judgment: Judgment normal.     Results for orders placed or performed in visit on 04/17/20  Microscopic Examination   BLD  Result Value Ref Range   WBC, UA None seen 0 - 5 /hpf   RBC None seen 0 - 2 /hpf   Epithelial Cells (non renal) 0-10 0 - 10 /hpf   Bacteria, UA None seen None seen/Few  Bayer DCA Hb A1c Waived  Result Value Ref Range   HB A1C (BAYER DCA - WAIVED) 7.3 (H) <7.0 %  CBC with Differential/Platelet  Result Value Ref Range   WBC 9.3 3.4 - 10.8 x10E3/uL   RBC 4.09 (L) 4.14 - 5.80 x10E6/uL   Hemoglobin 12.5 (L) 13.0 - 17.7 g/dL   Hematocrit 36.4 (L) 37.5 - 51.0 %   MCV 89 79 - 97 fL   MCH 30.6 26.6 - 33.0 pg   MCHC 34.3 31.5 - 35.7 g/dL   RDW 12.2 11.6 - 15.4 %   Platelets 202 150 - 450 x10E3/uL   Neutrophils 71 Not Estab. %   Lymphs 17 Not Estab. %   Monocytes 8 Not Estab. %   Eos 2 Not Estab. %   Basos 1 Not Estab. %   Neutrophils Absolute 6.7 1.4 - 7.0 x10E3/uL   Lymphocytes Absolute 1.6 0.7 - 3.1 x10E3/uL   Monocytes Absolute 0.8 0.1 - 0.9 x10E3/uL   EOS (ABSOLUTE) 0.1 0.0 - 0.4 x10E3/uL   Basophils Absolute 0.1 0.0 - 0.2 x10E3/uL   Immature Granulocytes 1 Not Estab. %   Immature Grans (Abs) 0.1 0.0 - 0.1 x10E3/uL  Comprehensive metabolic panel  Result Value Ref Range   Glucose 157 (H) 65 - 99 mg/dL   BUN 21 8 - 27 mg/dL   Creatinine, Ser 1.14 0.76 - 1.27 mg/dL   GFR calc non Af Amer 63 >59 mL/min/1.73   GFR calc Af Amer 73 >59 mL/min/1.73   BUN/Creatinine Ratio 18 10 - 24   Sodium 137 134 - 144 mmol/L   Potassium 4.3 3.5 - 5.2 mmol/L   Chloride 98 96 - 106 mmol/L   CO2 25 20 - 29 mmol/L   Calcium 9.4 8.6 - 10.2 mg/dL   Total Protein 6.8 6.0 - 8.5 g/dL   Albumin 4.4 3.7 - 4.7 g/dL   Globulin, Total 2.4 1.5 - 4.5 g/dL   Albumin/Globulin Ratio 1.8 1.2 - 2.2   Bilirubin Total 0.4 0.0 - 1.2 mg/dL   Alkaline Phosphatase 84 44 - 121 IU/L   AST 14 0 - 40 IU/L   ALT 14 0 - 44 IU/L  Lipid Panel w/o Chol/HDL Ratio  Result Value  Ref Range  Cholesterol, Total 116 100 - 199 mg/dL   Triglycerides 111 0 - 149 mg/dL   HDL 33 (L) >39 mg/dL   VLDL Cholesterol Cal 21 5 - 40 mg/dL   LDL Chol Calc (NIH) 62 0 - 99 mg/dL  Microalbumin, Urine Waived  Result Value Ref Range   Microalb, Ur Waived 150 (H) 0 - 19 mg/L   Creatinine, Urine Waived 100 10 - 300 mg/dL   Microalb/Creat Ratio >300 (H) <30 mg/g  PSA  Result Value Ref Range   Prostate Specific Ag, Serum 0.3 0.0 - 4.0 ng/mL  TSH  Result Value Ref Range   TSH 3.180 0.450 - 4.500 uIU/mL  Urinalysis, Routine w reflex microscopic  Result Value Ref Range   Specific Gravity, UA 1.015 1.005 - 1.030   pH, UA 5.5 5.0 - 7.5   Color, UA Yellow Yellow   Appearance Ur Clear Clear   Leukocytes,UA Negative Negative   Protein,UA 1+ (A) Negative/Trace   Glucose, UA Negative Negative   Ketones, UA Negative Negative   RBC, UA Negative Negative   Bilirubin, UA Negative Negative   Urobilinogen, Ur 0.2 0.2 - 1.0 mg/dL   Nitrite, UA Negative Negative   Microscopic Examination See below:       Assessment & Plan:   Problem List Items Addressed This Visit      Other   Chronic anxiety    Long conversation again with patient about how being over the age of 7, living alone and having balance issues make benzodiazepines are not a good choice for him and increase his risks of falls and death. Discussed starting to take his medicine as needed rather than taking it scheduled, and waiting to see if he starts feeling anxious. He refused to do this. Discussed that he does not have a controlled substance agreement on file. He will need to come and sign a controlled substance agreement. He states that he will not come up to sign a controlled substance agreement. 1 month supply of his medication sent to his pharmacy to be taken PRN. Will need to sign a controlled substance agreement and do a urine drug screen to be able to continue to receive his medication. Again discussed with patient that he  cannot get his medication from mail order as it is a controlled substance. Will need to get monthly supplies from the local pharmacy. Patient has been on this medicine for a very long time. Not possible to stop medication without very slow wean- repeatedly assured patient that we would not stop his medication. Follow up 1 month. Continue to monitor closely.      Relevant Medications   clorazepate (TRANXENE) 7.5 MG tablet       Follow up plan: No follow-ups on file.    . This visit was completed via telephone due to the restrictions of the COVID-19 pandemic. All issues as above were discussed and addressed but no physical exam was performed. If it was felt that the patient should be evaluated in the office, they were directed there. The patient verbally consented to this visit. Patient was unable to complete an audio/visual visit due to Lack of equipment. Due to the catastrophic nature of the COVID-19 pandemic, this visit was done through audio contact only. . Location of the patient: home . Location of the provider: work . Those involved with this call:  . Provider: Park Liter, DO . CMA: Louanna Raw, [place] . Front Desk/Registration: Jill Side  . Time spent on call: 25 minutes  on the phone discussing health concerns. 40 minutes total spent in review of patient's record and preparation of their chart.

## 2020-10-27 ENCOUNTER — Encounter: Payer: Self-pay | Admitting: Family Medicine

## 2020-10-27 NOTE — Assessment & Plan Note (Signed)
Long conversation again with patient about how being over the age of 68, living alone and having balance issues make benzodiazepines are not a good choice for him and increase his risks of falls and death. Discussed starting to take his medicine as needed rather than taking it scheduled, and waiting to see if he starts feeling anxious. He refused to do this. Discussed that he does not have a controlled substance agreement on file. He will need to come and sign a controlled substance agreement. He states that he will not come up to sign a controlled substance agreement. 1 month supply of his medication sent to his pharmacy to be taken PRN. Will need to sign a controlled substance agreement and do a urine drug screen to be able to continue to receive his medication. Again discussed with patient that he cannot get his medication from mail order as it is a controlled substance. Will need to get monthly supplies from the local pharmacy. Patient has been on this medicine for a very long time. Not possible to stop medication without very slow wean- repeatedly assured patient that we would not stop his medication. Follow up 1 month. Continue to monitor closely.

## 2020-11-13 ENCOUNTER — Encounter: Payer: Self-pay | Admitting: Family Medicine

## 2020-11-13 ENCOUNTER — Other Ambulatory Visit: Payer: Self-pay

## 2020-11-13 ENCOUNTER — Ambulatory Visit (INDEPENDENT_AMBULATORY_CARE_PROVIDER_SITE_OTHER): Payer: PPO | Admitting: Family Medicine

## 2020-11-13 VITALS — BP 139/67 | HR 52 | Temp 98.2°F | Ht 65.25 in | Wt 178.4 lb

## 2020-11-13 DIAGNOSIS — I152 Hypertension secondary to endocrine disorders: Secondary | ICD-10-CM | POA: Diagnosis not present

## 2020-11-13 DIAGNOSIS — E1159 Type 2 diabetes mellitus with other circulatory complications: Secondary | ICD-10-CM | POA: Diagnosis not present

## 2020-11-13 DIAGNOSIS — Z79899 Other long term (current) drug therapy: Secondary | ICD-10-CM

## 2020-11-13 DIAGNOSIS — N400 Enlarged prostate without lower urinary tract symptoms: Secondary | ICD-10-CM | POA: Diagnosis not present

## 2020-11-13 DIAGNOSIS — F419 Anxiety disorder, unspecified: Secondary | ICD-10-CM | POA: Diagnosis not present

## 2020-11-13 DIAGNOSIS — Z23 Encounter for immunization: Secondary | ICD-10-CM

## 2020-11-13 DIAGNOSIS — S51802A Unspecified open wound of left forearm, initial encounter: Secondary | ICD-10-CM

## 2020-11-13 DIAGNOSIS — E785 Hyperlipidemia, unspecified: Secondary | ICD-10-CM | POA: Diagnosis not present

## 2020-11-13 DIAGNOSIS — Z0000 Encounter for general adult medical examination without abnormal findings: Secondary | ICD-10-CM | POA: Diagnosis not present

## 2020-11-13 DIAGNOSIS — E1169 Type 2 diabetes mellitus with other specified complication: Secondary | ICD-10-CM

## 2020-11-13 MED ORDER — CLORAZEPATE DIPOTASSIUM 7.5 MG PO TABS: ORAL_TABLET | ORAL | 2 refills | Status: DC

## 2020-11-13 NOTE — Assessment & Plan Note (Signed)
Under good control on current regimen. Continue current regimen. Continue to monitor. Call with any concerns. Refills given. Labs drawn today.   

## 2020-11-13 NOTE — Assessment & Plan Note (Signed)
Going in the wrong direction with A1c of 7.9. will really work on diet and exercise and recheck in 3 months. Call with any concerns.

## 2020-11-13 NOTE — Assessment & Plan Note (Signed)
Controlled substance agreement and Utox done today. Patient is in agreement that we should decrease his medication. In that he has been on this medicine for a very long time- we will start by slowly cutting down. Will decrease to 1/2 tab qAM and 1 tab qPM for the next 3 months. Call with any concerns. Recheck 3 months.

## 2020-11-13 NOTE — Progress Notes (Signed)
BP 139/67   Pulse (!) 52   Temp 98.2 F (36.8 C)   Ht 5' 5.25" (1.657 m)   Wt 178 lb 6.4 oz (80.9 kg)   SpO2 98%   BMI 29.46 kg/m    Subjective:    Patient ID: Russell Byrd, male    DOB: March 16, 1946, 75 y.o.   MRN: [id number]  HPI: Russell Byrd is a 75 y.o. male presenting on 11/13/2020 for comprehensive medical examination. Current medical complaints include:  DIABETES Hypoglycemic episodes:no Polydipsia/polyuria: no Visual disturbance: yes Chest pain: no Paresthesias: yes Glucose Monitoring: no  Accucheck frequency: Not Checking Taking Insulin?: no Blood Pressure Monitoring: not checking Retinal Examination: Not up to Date Foot Exam: Up to Date Diabetic Education: Completed Pneumovax: Up to Date Influenza: Up to Date Aspirin: yes  HYPERTENSION / HYPERLIPIDEMIA Satisfied with current treatment? yes Duration of hypertension: chronic BP monitoring frequency: not checking BP medication side effects: no Past BP meds: atenolol, benazepril, HCTZ Duration of hyperlipidemia: chronic Cholesterol medication side effects: no Cholesterol supplements: none Past cholesterol medications: atorvastatin Medication compliance: excellent compliance Aspirin: yes Recent stressors: no Recurrent headaches: no Visual changes: no Palpitations: no Dyspnea: no Chest pain: no Lower extremity edema: no Dizzy/lightheaded: yes  ANXIETY/STRESS- has been reading about the risks of clorazepate and he agrees that he should start coming off of it.  Duration:stable Anxious mood: no  Excessive worrying: no Irritability: no  Sweating: no Nausea: no Palpitations:no Hyperventilation: no Panic attacks: no Agoraphobia: no  Obscessions/compulsions: no Depressed mood: no Depression screen [facility] 2/9 11/13/2020 10/23/2020 07/18/2020 04/17/2020 01/17/2020  Decreased Interest 0 0 0 0 0  Down, Depressed, Hopeless 0 0 0 0 0  PHQ - 2 Score 0 0 0 0 0  Altered sleeping 0 0 0 - 0  Tired, decreased  energy 0 0 0 - 0  Change in appetite 0 0 0 - 0  Feeling bad or failure about yourself  0 0 0 - 0  Trouble concentrating 0 0 0 - 0  Moving slowly or fidgety/restless 0 0 0 - 0  Suicidal thoughts 0 0 0 - -  PHQ-9 Score 0 0 0 - 0  Difficult doing work/chores - - Not difficult at all - -   Anhedonia: no Weight changes: no Insomnia: no   Hypersomnia: no Fatigue/loss of energy: no Feelings of worthlessness: no Feelings of guilt: no Impaired concentration/indecisiveness: no Suicidal ideations: no  Crying spells: no Recent Stressors/Life Changes: no   Relationship problems: no   Family stress: no     Financial stress: no    Job stress: no    Recent death/loss: no  He currently lives with: alone Interim Problems from his last visit: no  Functional Status Survey: Is the patient deaf or have difficulty hearing?: No Does the patient have difficulty seeing, even when wearing glasses/contacts?: No Does the patient have difficulty concentrating, remembering, or making decisions?: No Does the patient have difficulty walking or climbing stairs?: Yes Does the patient have difficulty dressing or bathing?: Yes Does the patient have difficulty doing errands alone such as visiting a doctor's office or shopping?: No  FALL RISK: Fall Risk  11/13/2020 04/17/2020 01/17/2020 10/11/2019 10/13/2017  Falls in the past year? 1 0 0 (No Data) No  Comment - - - patient did not fill out the form -  Number falls in past yr: 0 0 0 - -  Injury with Fall? 0 0 0 - -  Risk for fall due to :  History of fall(s);Impaired balance/gait - - - -  Follow up Falls evaluation completed Falls evaluation completed Falls evaluation completed - -    Depression Screen Depression screen [facility] 2/9 11/13/2020 10/23/2020 07/18/2020 04/17/2020 01/17/2020  Decreased Interest 0 0 0 0 0  Down, Depressed, Hopeless 0 0 0 0 0  PHQ - 2 Score 0 0 0 0 0  Altered sleeping 0 0 0 - 0  Tired, decreased energy 0 0 0 - 0  Change in appetite 0 0 0 - 0   Feeling bad or failure about yourself  0 0 0 - 0  Trouble concentrating 0 0 0 - 0  Moving slowly or fidgety/restless 0 0 0 - 0  Suicidal thoughts 0 0 0 - -  PHQ-9 Score 0 0 0 - 0  Difficult doing work/chores - - Not difficult at all - -    Past Medical History:  Past Medical History:  Diagnosis Date  . Diabetes mellitus without complication ([place])   . History of kidney stones   . Hyperlipidemia   . Hypertension     Surgical History:  Past Surgical History:  Procedure Laterality Date  . KNEE SURGERY Bilateral    arthroscopic    Medications:  Current Outpatient Medications on File Prior to Visit  Medication Sig  . Acetaminophen (TYLENOL 8 HOUR PO) Take by mouth daily.  Marland Kitchen aspirin EC 81 MG tablet Take 81 mg by mouth daily.  Marland Kitchen atenolol (TENORMIN) 25 MG tablet Take 1 tablet (25 mg total) by mouth 2 (two) times daily.  Marland Kitchen atorvastatin (LIPITOR) 20 MG tablet Take 1 tablet (20 mg total) by mouth daily.  . benazepril (LOTENSIN) 40 MG tablet Take 1 tablet (40 mg total) by mouth daily.  . blood glucose meter kit and supplies KIT Dispense based on patient and insurance preference; One touch. Use up to three times daily as directed. (FOR ICD-9 250.00, 250.01).  . hydrochlorothiazide (HYDRODIURIL) 25 MG tablet Take 1 tablet (25 mg total) by mouth daily.  . Ibuprofen (ADVIL) 200 MG CAPS Take by mouth.  . lansoprazole (PREVACID) 30 MG capsule Take 1 capsule (30 mg total) by mouth daily at 12 noon.  . metFORMIN (GLUCOPHAGE) 500 MG tablet Take 2 tablets (1,000 mg total) by mouth 2 (two) times daily with a meal.  . Misc Natural Products (OSTEO BI-FLEX/5-LOXIN ADVANCED PO) Take by mouth.  . Multiple Vitamins-Minerals (CENTRUM SILVER ADULT 50+) TABS Take by mouth daily.  Glory Rosebush Delica Lancets 28M MISC Apply topically.  Glory Rosebush ULTRA test strip 1 each 2 (two) times daily.  . vitamin E 400 UNIT capsule Take 400 Units by mouth daily.  . Zinc 50 MG CAPS Take 50 mg by mouth daily.  .  diclofenac Sodium (VOLTAREN) 1 % GEL Apply 4 g topically 4 (four) times daily. (Patient not taking: Reported on 11/13/2020)   No current facility-administered medications on file prior to visit.    Allergies:  Allergies  Allergen Reactions  . Prednisone Other (See Comments)    steroids  . Tetracycline     Other reaction(s): Unknown  . Tetracyclines & Related   . Tramadol Other (See Comments)    Keeps him awake    Social History:  Social History   Socioeconomic History  . Marital status: Married    Spouse name: Not on file  . Number of children: Not on file  . Years of education: Not on file  . Highest education level: Not on file  Occupational History  .  Not on file  Tobacco Use  . Smoking status: Never Smoker  . Smokeless tobacco: Current User    Types: Chew  Vaping Use  . Vaping Use: Never used  Substance and Sexual Activity  . Alcohol use: Not Currently  . Drug use: No  . Sexual activity: Not Currently  Other Topics Concern  . Not on file  Social History Narrative  . Not on file   Social Determinants of Health   Financial Resource Strain: Not on file  Food Insecurity: Not on file  Transportation Needs: Not on file  Physical Activity: Not on file  Stress: Not on file  Social Connections: Not on file  Intimate Partner Violence: Not on file   Social History   Tobacco Use  Smoking Status Never Smoker  Smokeless Tobacco Current User  . Types: Chew   Social History   Substance and Sexual Activity  Alcohol Use Not Currently    Family History:  Family History  Problem Relation Age of Onset  . Diabetes Father   . Heart disease Father   . Diabetes Mother     Past medical history, surgical history, medications, allergies, family history and social history reviewed with patient today and changes made to appropriate areas of the chart.   Review of Systems  Constitutional: Negative.   HENT: Negative.   Eyes: Positive for blurred vision. Negative for  double vision, photophobia, pain, discharge and redness.  Respiratory: Negative.   Cardiovascular: Negative.   Gastrointestinal: Negative.   Genitourinary: Negative.   Musculoskeletal: Negative.   Skin: Negative.   Neurological: Positive for dizziness and tingling. Negative for tremors, sensory change, speech change, focal weakness, seizures, loss of consciousness, weakness and headaches.  Endo/Heme/Allergies: Negative.   Psychiatric/Behavioral: Negative.    All other ROS negative except what is listed above and in the HPI.      Objective:    BP 139/67   Pulse (!) 52   Temp 98.2 F (36.8 C)   Ht 5' 5.25" (1.657 m)   Wt 178 lb 6.4 oz (80.9 kg)   SpO2 98%   BMI 29.46 kg/m   Wt Readings from Last 3 Encounters:  11/13/20 178 lb 6.4 oz (80.9 kg)  04/17/20 179 lb (81.2 kg)  10/11/19 179 lb (81.2 kg)   Physical Exam Vitals and nursing note reviewed.  Constitutional:      General: He is not in acute distress.    Appearance: Normal appearance. He is not ill-appearing, toxic-appearing or diaphoretic.  HENT:     Head: Normocephalic and atraumatic.     Right Ear: Tympanic membrane, ear canal and external ear normal. There is no impacted cerumen.     Left Ear: Tympanic membrane, ear canal and external ear normal. There is no impacted cerumen.     Nose: Nose normal. No congestion or rhinorrhea.     Mouth/Throat:     Mouth: Mucous membranes are moist.     Pharynx: Oropharynx is clear. No oropharyngeal exudate or posterior oropharyngeal erythema.  Eyes:     General: No scleral icterus.       Right eye: No discharge.        Left eye: No discharge.     Extraocular Movements: Extraocular movements intact.     Conjunctiva/sclera: Conjunctivae normal.     Pupils: Pupils are equal, round, and reactive to light.  Neck:     Vascular: No carotid bruit.  Cardiovascular:     Rate and Rhythm: Normal rate and regular rhythm.  Pulses: Normal pulses.     Heart sounds: No murmur heard. No  friction rub. No gallop.   Pulmonary:     Effort: Pulmonary effort is normal. No respiratory distress.     Breath sounds: Normal breath sounds. No stridor. No wheezing, rhonchi or rales.  Chest:     Chest wall: No tenderness.  Abdominal:     General: Abdomen is flat. Bowel sounds are normal. There is no distension.     Palpations: Abdomen is soft. There is no mass.     Tenderness: There is no abdominal tenderness. There is no right CVA tenderness, left CVA tenderness, guarding or rebound.     Hernia: No hernia is present.  Genitourinary:    Comments: Genital exam deferred with shared decision making Musculoskeletal:        General: No swelling, tenderness, deformity or signs of injury.     Cervical back: Normal range of motion and neck supple. No rigidity. No muscular tenderness.     Right lower leg: No edema.     Left lower leg: No edema.  Lymphadenopathy:     Cervical: No cervical adenopathy.  Skin:    General: Skin is warm and dry.     Capillary Refill: Capillary refill takes less than 2 seconds.     Coloration: Skin is not jaundiced or pale.     Findings: No bruising, erythema, lesion or rash.     Comments: Well healing wound on L arm  Neurological:     General: No focal deficit present.     Mental Status: He is alert and oriented to person, place, and time.     Cranial Nerves: No cranial nerve deficit.     Sensory: No sensory deficit.     Motor: No weakness.     Coordination: Coordination normal.     Gait: Gait normal.     Deep Tendon Reflexes: Reflexes normal.  Psychiatric:        Mood and Affect: Mood normal.        Behavior: Behavior normal.        Thought Content: Thought content normal.        Judgment: Judgment normal.     6CIT Screen 11/13/2020  What Year? 0 points  What month? 0 points  What time? 0 points  Count back from 20 0 points  Months in reverse 0 points  Repeat phrase 8 points  Total Score 8    Results for orders placed or performed in visit on  04/17/20  Microscopic Examination   BLD  Result Value Ref Range   WBC, UA None seen 0 - 5 /hpf   RBC None seen 0 - 2 /hpf   Epithelial Cells (non renal) 0-10 0 - 10 /hpf   Bacteria, UA None seen None seen/Few  Bayer DCA Hb A1c Waived  Result Value Ref Range   HB A1C (BAYER DCA - WAIVED) 7.3 (H) <7.0 %  CBC with Differential/Platelet  Result Value Ref Range   WBC 9.3 3.4 - 10.8 x10E3/uL   RBC 4.09 (L) 4.14 - 5.80 x10E6/uL   Hemoglobin 12.5 (L) 13.0 - 17.7 g/dL   Hematocrit 36.4 (L) 37.5 - 51.0 %   MCV 89 79 - 97 fL   MCH 30.6 26.6 - 33.0 pg   MCHC 34.3 31.5 - 35.7 g/dL   RDW 12.2 11.6 - 15.4 %   Platelets 202 150 - 450 x10E3/uL   Neutrophils 71 Not Estab. %   Lymphs 17 Not  Estab. %   Monocytes 8 Not Estab. %   Eos 2 Not Estab. %   Basos 1 Not Estab. %   Neutrophils Absolute 6.7 1.4 - 7.0 x10E3/uL   Lymphocytes Absolute 1.6 0.7 - 3.1 x10E3/uL   Monocytes Absolute 0.8 0.1 - 0.9 x10E3/uL   EOS (ABSOLUTE) 0.1 0.0 - 0.4 x10E3/uL   Basophils Absolute 0.1 0.0 - 0.2 x10E3/uL   Immature Granulocytes 1 Not Estab. %   Immature Grans (Abs) 0.1 0.0 - 0.1 x10E3/uL  Comprehensive metabolic panel  Result Value Ref Range   Glucose 157 (H) 65 - 99 mg/dL   BUN 21 8 - 27 mg/dL   Creatinine, Ser 1.14 0.76 - 1.27 mg/dL   GFR calc non Af Amer 63 >59 mL/min/1.73   GFR calc Af Amer 73 >59 mL/min/1.73   BUN/Creatinine Ratio 18 10 - 24   Sodium 137 134 - 144 mmol/L   Potassium 4.3 3.5 - 5.2 mmol/L   Chloride 98 96 - 106 mmol/L   CO2 25 20 - 29 mmol/L   Calcium 9.4 8.6 - 10.2 mg/dL   Total Protein 6.8 6.0 - 8.5 g/dL   Albumin 4.4 3.7 - 4.7 g/dL   Globulin, Total 2.4 1.5 - 4.5 g/dL   Albumin/Globulin Ratio 1.8 1.2 - 2.2   Bilirubin Total 0.4 0.0 - 1.2 mg/dL   Alkaline Phosphatase 84 44 - 121 IU/L   AST 14 0 - 40 IU/L   ALT 14 0 - 44 IU/L  Lipid Panel w/o Chol/HDL Ratio  Result Value Ref Range   Cholesterol, Total 116 100 - 199 mg/dL   Triglycerides 111 0 - 149 mg/dL   HDL 33 (L) >39  mg/dL   VLDL Cholesterol Cal 21 5 - 40 mg/dL   LDL Chol Calc (NIH) 62 0 - 99 mg/dL  Microalbumin, Urine Waived  Result Value Ref Range   Microalb, Ur Waived 150 (H) 0 - 19 mg/L   Creatinine, Urine Waived 100 10 - 300 mg/dL   Microalb/Creat Ratio >300 (H) <30 mg/g  PSA  Result Value Ref Range   Prostate Specific Ag, Serum 0.3 0.0 - 4.0 ng/mL  TSH  Result Value Ref Range   TSH 3.180 0.450 - 4.500 uIU/mL  Urinalysis, Routine w reflex microscopic  Result Value Ref Range   Specific Gravity, UA 1.015 1.005 - 1.030   pH, UA 5.5 5.0 - 7.5   Color, UA Yellow Yellow   Appearance Ur Clear Clear   Leukocytes,UA Negative Negative   Protein,UA 1+ (A) Negative/Trace   Glucose, UA Negative Negative   Ketones, UA Negative Negative   RBC, UA Negative Negative   Bilirubin, UA Negative Negative   Urobilinogen, Ur 0.2 0.2 - 1.0 mg/dL   Nitrite, UA Negative Negative   Microscopic Examination See below:       Assessment & Plan:   Problem List Items Addressed This Visit      Cardiovascular and Mediastinum   Hypertension 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 Continue current regimen. Continue to monitor. Call with any concerns. Refills given. Labs drawn today.        Relevant Orders   Comprehensive metabolic panel   CBC with Differential/Platelet   TSH   Microalbumin, Urine Waived     Endocrine   Diabetes mellitus associated with hormonal etiology ([place])    Going in the wrong direction with A1c of 7.9. will really work on diet and exercise and recheck in 3  months. Call with any concerns.        Relevant Orders   Comprehensive metabolic panel   CBC with Differential/Platelet   Urinalysis, Routine w reflex microscopic   Bayer DCA Hb A1c Waived   Microalbumin, Urine Waived     Genitourinary   BPH (benign prostatic hyperplasia)    Under good control on current regimen. Continue current regimen. Continue to monitor. Call with any concerns. Refills  given. Labs drawn today.       Relevant Orders   Comprehensive metabolic panel   CBC with Differential/Platelet   PSA     Other   Hyperlipidemia    Under good control on current regimen. Continue current regimen. Continue to monitor. Call with any concerns. Refills given. Labs drawn today.       Relevant Orders   Comprehensive metabolic panel   CBC with Differential/Platelet   Lipid Panel w/o Chol/HDL Ratio   Chronic anxiety    Controlled substance agreement and Utox done today. Patient is in agreement that we should decrease his medication. In that he has been on this medicine for a very long time- we will start by slowly cutting down. Will decrease to 1/2 tab qAM and 1 tab qPM for the next 3 months. Call with any concerns. Recheck 3 months.       Relevant Medications   clorazepate (TRANXENE) 7.5 MG tablet   Other Relevant Orders   [id number] 11+Oxyco+Alc+Crt-Bund   Controlled substance agreement signed    Other Visit Diagnoses    Wellness examination    -  Primary   Preventative care discussed today as below.    Routine general medical examination at a health care facility       Vaccines updated. Screening labs checked today. Declines colonscopy. Continue diet and exercise. Continue to monitor. Call with any concerns.    Open wound of left forearm, initial encounter       Due for Td. Given today.       Preventative Services:  Health Risk Assessment and Personalized Prevention Plan:Done today Bone Mass Measurements: N/A CVD Screening: Done today Colon Cancer Screening: Declined Depression Screening: Done today Diabetes Screening: Done today Glaucoma Screening: See your eye doctor Hepatitis B vaccine: N/A Hepatitis C screening: Up to date  HIV Screening: Up to date Flu Vaccine: Get in the Fall Lung cancer Screening: N/A Obesity Screening: Done today Pneumonia Vaccines (2): Up to date.  STI Screening: N/A PSA screening: Labs drawn today  Discussed aspirin prophylaxis  for myocardial infarction prevention and decision was made to continue ASA  LABORATORY TESTING:  Health maintenance labs ordered today as discussed above.   The natural history of prostate cancer and ongoing controversy regarding screening and potential treatment outcomes of prostate cancer has been discussed with the patient. The meaning of a false positive PSA and a false negative PSA has been discussed. He indicates understanding of the limitations of this screening test and wishes to proceed with screening PSA testing.   IMMUNIZATIONS:   - Tdap: Tetanus vaccination status reviewed: Td vaccination indicated and given today. - Influenza: Postponed to flu season - Pneumovax: Up to date - Prevnar: Up to date - COVID vaccine: Up to date  SCREENING: - Colonoscopy: Declined  Discussed with patient purpose of the colonoscopy is to detect colon cancer at curable precancerous or early stages   PATIENT COUNSELING:    Sexuality: Discussed sexually transmitted diseases, partner selection, use of condoms, avoidance of unintended pregnancy  and contraceptive alternatives.  Advised to avoid cigarette smoking.  I discussed with the patient that most people either abstain from alcohol or drink within safe limits (<=14/week and <=4 drinks/occasion for males, <=7/weeks and <= 3 drinks/occasion for females) and that the risk for alcohol disorders and other health effects rises proportionally with the number of drinks per week and how often a drinker exceeds daily limits.  Discussed cessation/primary prevention of drug use and availability of treatment for abuse.   Diet: Encouraged to adjust caloric intake to maintain  or achieve ideal body weight, to reduce intake of dietary saturated fat and total fat, to limit sodium intake by avoiding high sodium foods and not adding table salt, and to maintain adequate dietary potassium and calcium preferably from fresh fruits, vegetables, and low-fat dairy  products.    stressed the importance of regular exercise  Injury prevention: Discussed safety belts, safety helmets, smoke detector, smoking near bedding or upholstery.   Dental health: Discussed importance of regular tooth brushing, flossing, and dental visits.   Follow up plan: NEXT PREVENTATIVE PHYSICAL DUE IN 1 YEAR. Return in about 3 months (around 02/11/2021) for Before 02/11/21 in person best.

## 2020-11-13 NOTE — Patient Instructions (Addendum)
Preventative Services:  Health Risk Assessment and Personalized Prevention Plan:Done today Bone Mass Measurements: N/A CVD Screening: Done today Colon Cancer Screening: Declined Depression Screening: Done today Diabetes Screening: Done today Glaucoma Screening: See your eye doctor Hepatitis B vaccine: N/A Hepatitis C screening: Up to date  HIV Screening: Up to date Flu Vaccine: Get in the Fall Lung cancer Screening: N/A Obesity Screening: Done today Pneumonia Vaccines (2): Up to date.  STI Screening: N/A PSA screening: Labs drawn today   Health Maintenance After Age 35 After age 77, you are at a higher risk for certain long-term diseases and infections as well as injuries from falls. Falls are a major cause of broken bones and head injuries in people who are older than age 6. Getting regular preventive care can help to keep you healthy and well. Preventive care includes getting regular testing and making lifestyle changes as recommended by your health care provider. Talk with your health care provider about:  Which screenings and tests you should have. A screening is a test that checks for a disease when you have no symptoms.  A diet and exercise plan that is right for you. What should I know about screenings and tests to prevent falls? Screening and testing are the best ways to find a health problem early. Early diagnosis and treatment give you the best chance of managing medical conditions that are common after age 2. Certain conditions and lifestyle choices may make you more likely to have a fall. Your health care provider may recommend:  Regular vision checks. Poor vision and conditions such as cataracts can make you more likely to have a fall. If you wear glasses, make sure to get your prescription updated if your vision changes.  Medicine review. Work with your health care provider to regularly review all of the medicines you are taking, including over-the-counter medicines. Ask  your health care provider about any side effects that may make you more likely to have a fall. Tell your health care provider if any medicines that you take make you feel dizzy or sleepy.  Osteoporosis screening. Osteoporosis is a condition that causes the bones to get weaker. This can make the bones weak and cause them to break more easily.  Blood pressure screening. Blood pressure changes and medicines to control blood pressure can make you feel dizzy.  Strength and balance checks. Your health care provider may recommend certain tests to check your strength and balance while standing, walking, or changing positions.  Foot health exam. Foot pain and numbness, as well as not wearing proper footwear, can make you more likely to have a fall.  Depression screening. You may be more likely to have a fall if you have a fear of falling, feel emotionally low, or feel unable to do activities that you used to do.  Alcohol use screening. Using too much alcohol can affect your balance and may make you more likely to have a fall. What actions can I take to lower my risk of falls? General instructions  Talk with your health care provider about your risks for falling. Tell your health care provider if: ? You fall. Be sure to tell your health care provider about all falls, even ones that seem minor. ? You feel dizzy, sleepy, or off-balance.  Take over-the-counter and prescription medicines only as told by your health care provider. These include any supplements.  Eat a healthy diet and maintain a healthy weight. A healthy diet includes low-fat dairy products, low-fat (lean) meats,  and fiber from whole grains, beans, and lots of fruits and vegetables. Home safety  Remove any tripping hazards, such as rugs, cords, and clutter.  Install safety equipment such as grab bars in bathrooms and safety rails on stairs.  Keep rooms and walkways well-lit. Activity  Follow a regular exercise program to stay fit.  This will help you maintain your balance. Ask your health care provider what types of exercise are appropriate for you.  If you need a cane or walker, use it as recommended by your health care provider.  Wear supportive shoes that have nonskid soles.   Lifestyle  Do not drink alcohol if your health care provider tells you not to drink.  If you drink alcohol, limit how much you have: ? 0-1 drink a day for women. ? 0-2 drinks a day for men.  Be aware of how much alcohol is in your drink. In the [place] one drink equals one typical bottle of beer (12 oz), one-half glass of wine (5 oz), or one shot of hard liquor (1 oz).  Do not use any products that contain nicotine or tobacco, such as cigarettes and e-cigarettes. If you need help quitting, ask your health care provider. Summary  Having a healthy lifestyle and getting preventive care can help to protect your health and wellness after age 73.  Screening and testing are the best way to find a health problem early and help you avoid having a fall. Early diagnosis and treatment give you the best chance for managing medical conditions that are more common for people who are older than age 31.  Falls are a major cause of broken bones and head injuries in people who are older than age 103. Take precautions to prevent a fall at home.  Work with your health care provider to learn what changes you can make to improve your health and wellness and to prevent falls. This information is not intended to replace advice given to you by your health care provider. Make sure you discuss any questions you have with your health care provider. Document Revised: 10/15/2018 Document Reviewed: 05/07/2017 Elsevier Patient Education  2021 [employer]. Td (Tetanus, Diphtheria) Vaccine: What You Need to Know 1. Why get vaccinated? Td vaccine can prevent tetanus and diphtheria. Tetanus enters the body through cuts or wounds. Diphtheria spreads from person to  person.  TETANUS (T) causes painful stiffening of the muscles. Tetanus can lead to serious health problems, including being unable to open the mouth, having trouble swallowing and breathing, or death.  DIPHTHERIA (D) can lead to difficulty breathing, heart failure, paralysis, or death. 2. Td vaccine Td is only for children 7 years and older, adolescents, and adults.  Td is usually given as a booster dose every 10 years, or after 5 years in the case of a severe or dirty wound or burn. Another vaccine, called "Tdap," may be used instead of Td. Tdap protects against pertussis, also known as "whooping cough," in addition to tetanus and diphtheria. Td may be given at the same time as other vaccines. 3. Talk with your health care provider Tell your vaccination provider if the person getting the vaccine:  Has had an allergic reaction after a previous dose of any vaccine that protects against tetanus or diphtheria, or has any severe, life-threatening allergies  Has ever had Guillain-Barr Syndrome (also called "GBS")  Has had severe pain or swelling after a previous dose of any vaccine that protects against tetanus or diphtheria In some cases, your  health care provider may decide to postpone Td vaccination until a future visit. People with minor illnesses, such as a cold, may be vaccinated. People who are moderately or severely ill should usually wait until they recover before getting Td vaccine.  Your health care provider can give you more information. 4. Risks of a vaccine reaction  Pain, redness, or swelling where the shot was given, mild fever, headache, feeling tired, and nausea, vomiting, diarrhea, or stomachache sometimes happen after Td vaccination. People sometimes faint after medical procedures, including vaccination. Tell your provider if you feel dizzy or have vision changes or ringing in the ears.  As with any medicine, there is a very remote chance of a vaccine causing a severe  allergic reaction, other serious injury, or death. 5. What if there is a serious problem? An allergic reaction could occur after the vaccinated person leaves the clinic. If you see signs of a severe allergic reaction (hives, swelling of the face and throat, difficulty breathing, a fast heartbeat, dizziness, or weakness), call 9-1-1 and get the person to the nearest hospital.  For other signs that concern you, call your health care provider.  Adverse reactions should be reported to the Vaccine Adverse Event Reporting System (VAERS). Your health care provider will usually file this report, or you can do it yourself. Visit the VAERS website at www.vaers.SamedayNews.es or call 214-433-4942. VAERS is only for reporting reactions, and VAERS staff members do not give medical advice. 6. The National Vaccine Injury Compensation Program The [employer] Vaccine Injury Compensation Program (VICP) is a federal program that was created to compensate people who may have been injured by certain vaccines. Claims regarding alleged injury or death due to vaccination have a time limit for filing, which may be as short as two years. Visit the VICP website at GoldCloset.com.ee or call 470 203 9403 to learn about the program and about filing a claim. 7. How can I learn more?  Ask your health care provider.  Call your local or state health department.  Visit the website of the Food and Drug Administration (FDA) for vaccine package inserts and additional information at TraderRating.uy.  Contact the Centers for Disease Control and Prevention (CDC): ? Call 587-489-9005 (1-800-CDC-INFO) or ? Visit CDC's website at http://hunter.com/. Vaccine Information Statement Td (Tetanus, Diphtheria) Vaccine (02/11/2020) This information is not intended to replace advice given to you by your health care provider. Make sure you discuss any questions you have with your health care  provider. Document Revised: 03/30/2020 Document Reviewed: 03/30/2020 Elsevier Patient Education  2021 [employer].

## 2020-11-14 ENCOUNTER — Encounter: Payer: Self-pay | Admitting: Family Medicine

## 2020-11-14 LAB — COMPREHENSIVE METABOLIC PANEL
ALT: 16 IU/L (ref 0–44)
AST: 14 IU/L (ref 0–40)
Albumin/Globulin Ratio: 1.5 (ref 1.2–2.2)
Albumin: 4.5 g/dL (ref 3.7–4.7)
Alkaline Phosphatase: 94 IU/L (ref 44–121)
BUN/Creatinine Ratio: 18 (ref 10–24)
BUN: 21 mg/dL (ref 8–27)
Bilirubin Total: 0.4 mg/dL (ref 0.0–1.2)
CO2: 21 mmol/L (ref 20–29)
Calcium: 10 mg/dL (ref 8.6–10.2)
Chloride: 96 mmol/L (ref 96–106)
Creatinine, Ser: 1.17 mg/dL (ref 0.76–1.27)
Globulin, Total: 3 g/dL (ref 1.5–4.5)
Glucose: 157 mg/dL — ABNORMAL HIGH (ref 65–99)
Potassium: 4.7 mmol/L (ref 3.5–5.2)
Sodium: 134 mmol/L (ref 134–144)
Total Protein: 7.5 g/dL (ref 6.0–8.5)
eGFR: 65 mL/min/{1.73_m2} (ref >59)

## 2020-11-14 LAB — CBC WITH DIFFERENTIAL/PLATELET
Basophils Absolute: 0 10*3/uL (ref 0.0–0.2)
Basos: 0 %
EOS (ABSOLUTE): 0.1 10*3/uL (ref 0.0–0.4)
Eos: 1 %
Hematocrit: 37.9 % (ref 37.5–51.0)
Hemoglobin: 13.3 g/dL (ref 13.0–17.7)
Immature Grans (Abs): 0.1 10*3/uL (ref 0.0–0.1)
Immature Granulocytes: 1 %
Lymphocytes Absolute: 1.7 10*3/uL (ref 0.7–3.1)
Lymphs: 17 %
MCH: 31.1 pg (ref 26.6–33.0)
MCHC: 35.1 g/dL (ref 31.5–35.7)
MCV: 89 fL (ref 79–97)
Monocytes Absolute: 0.6 10*3/uL (ref 0.1–0.9)
Monocytes: 6 %
Neutrophils Absolute: 7.3 10*3/uL — ABNORMAL HIGH (ref 1.4–7.0)
Neutrophils: 75 %
Platelets: 210 10*3/uL (ref 150–450)
RBC: 4.28 x10E6/uL (ref 4.14–5.80)
RDW: 12.2 % (ref 11.6–15.4)
WBC: 9.9 10*3/uL (ref 3.4–10.8)

## 2020-11-14 LAB — LIPID PANEL W/O CHOL/HDL RATIO
Cholesterol, Total: 137 mg/dL (ref 100–199)
HDL: 36 mg/dL — ABNORMAL LOW (ref >39)
LDL Chol Calc (NIH): 72 mg/dL (ref 0–99)
Triglycerides: 167 mg/dL — ABNORMAL HIGH (ref 0–149)
VLDL Cholesterol Cal: 29 mg/dL (ref 5–40)

## 2020-11-14 LAB — URINALYSIS, ROUTINE W REFLEX MICROSCOPIC
Bilirubin, UA: NEGATIVE
Glucose, UA: NEGATIVE
Ketones, UA: NEGATIVE
Leukocytes,UA: NEGATIVE
Nitrite, UA: NEGATIVE
RBC, UA: NEGATIVE
Specific Gravity, UA: 1.015 (ref 1.005–1.030)
Urobilinogen, Ur: 0.2 mg/dL (ref 0.2–1.0)
pH, UA: 5.5 (ref 5.0–7.5)

## 2020-11-14 LAB — MICROALBUMIN, URINE WAIVED
Creatinine, Urine Waived: 100 mg/dL (ref 10–300)
Microalb, Ur Waived: 80 mg/L — ABNORMAL HIGH (ref 0–19)

## 2020-11-14 LAB — PSA: Prostate Specific Ag, Serum: 0.3 ng/mL (ref 0.0–4.0)

## 2020-11-14 LAB — BAYER DCA HB A1C WAIVED: HB A1C (BAYER DCA - WAIVED): 7.9 % — ABNORMAL HIGH (ref <7.0)

## 2020-11-14 LAB — TSH: TSH: 3.76 u[IU]/mL (ref 0.450–4.500)

## 2020-12-04 LAB — DRUG SCREEN 764883 11+OXYCO+ALC+CRT-BUND
Amphetamines, Urine: NEGATIVE ng/mL
Barbiturate: NEGATIVE ng/mL
Cannabinoid Quant, Ur: NEGATIVE ng/mL
Cocaine (Metabolite): NEGATIVE ng/mL
Creatinine: 80.8 mg/dL (ref 20.0–300.0)
Ethanol: NEGATIVE %
Meperidine: NEGATIVE ng/mL
Methadone Screen, Urine: NEGATIVE ng/mL
OPIATE SCREEN URINE: NEGATIVE ng/mL
Oxycodone/Oxymorphone, Urine: NEGATIVE ng/mL
Phencyclidine: NEGATIVE ng/mL
Propoxyphene: NEGATIVE ng/mL
Tramadol: NEGATIVE ng/mL
pH, Urine: 5.2 (ref 4.5–8.9)

## 2020-12-04 LAB — BENZODIAZEPINES CONFIRM, URINE
Alprazolam: NEGATIVE
Benzodiazepines: POSITIVE ng/mL — AB
Clonazepam: NEGATIVE
Flurazepam: NEGATIVE
Lorazepam: NEGATIVE
Midazolam: NEGATIVE
Nordiazepam Conf: 193 ng/mL
Nordiazepam: POSITIVE — AB
Oxazepam Conf: 1761 ng/mL
Oxazepam: POSITIVE — AB
Temazepam: NEGATIVE
Triazolam: NEGATIVE

## 2021-01-30 ENCOUNTER — Other Ambulatory Visit: Payer: Self-pay

## 2021-01-30 DIAGNOSIS — I152 Hypertension secondary to endocrine disorders: Secondary | ICD-10-CM

## 2021-01-30 DIAGNOSIS — E1159 Type 2 diabetes mellitus with other circulatory complications: Secondary | ICD-10-CM

## 2021-01-30 NOTE — Telephone Encounter (Signed)
Should be good until his appointment- can we confirm?

## 2021-01-30 NOTE — Telephone Encounter (Signed)
Spoke with patient he states he is good till his appt.

## 2021-01-31 ENCOUNTER — Telehealth: Payer: Self-pay
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
## 2021-01-31 NOTE — Telephone Encounter (Signed)
Needs blood work. Virtual is not appropriate.

## 2021-02-01 NOTE — Telephone Encounter (Signed)
He does not need to be fasting

## 2021-02-01 NOTE — Telephone Encounter (Signed)
Pt calling back to ask if he needs to fast declined morning apt multiple times declines to come back for labs Pt wants to be sure if he would be fasting or not.

## 2021-02-02 NOTE — Telephone Encounter (Signed)
Lvm THAT HE DOES NOT NEED TO FAST

## 2021-02-05 ENCOUNTER — Other Ambulatory Visit: Payer: Self-pay

## 2021-02-05 ENCOUNTER — Ambulatory Visit (INDEPENDENT_AMBULATORY_CARE_PROVIDER_SITE_OTHER): Payer: PPO | Admitting: Family Medicine

## 2021-02-05 ENCOUNTER — Encounter: Payer: Self-pay | Admitting: Family Medicine

## 2021-02-05 VITALS — BP 158/50 | HR 53 | Temp 98.2°F | Ht 65.2 in | Wt 177.6 lb

## 2021-02-05 DIAGNOSIS — F419 Anxiety disorder, unspecified: Secondary | ICD-10-CM | POA: Diagnosis not present

## 2021-02-05 DIAGNOSIS — I152 Hypertension secondary to endocrine disorders: Secondary | ICD-10-CM | POA: Diagnosis not present

## 2021-02-05 DIAGNOSIS — E1159 Type 2 diabetes mellitus with other circulatory complications: Secondary | ICD-10-CM | POA: Diagnosis not present

## 2021-02-05 DIAGNOSIS — E1169 Type 2 diabetes mellitus with other specified complication: Secondary | ICD-10-CM

## 2021-02-05 MED ORDER — BENAZEPRIL HCL 40 MG PO TABS: 40.0000 mg | ORAL_TABLET | Freq: Every day | ORAL | 0 refills | Status: DC

## 2021-02-05 MED ORDER — METFORMIN HCL 500 MG PO TABS: 1000.0000 mg | ORAL_TABLET | Freq: Two times a day (BID) | ORAL | 0 refills | Status: DC

## 2021-02-05 MED ORDER — ATENOLOL 25 MG PO TABS: 25.0000 mg | ORAL_TABLET | Freq: Two times a day (BID) | ORAL | 0 refills | Status: DC

## 2021-02-05 MED ORDER — CLORAZEPATE DIPOTASSIUM 3.75 MG PO TABS: 3.7500 mg | ORAL_TABLET | Freq: Two times a day (BID) | ORAL | 2 refills | Status: DC

## 2021-02-05 MED ORDER — ATORVASTATIN CALCIUM 20 MG PO TABS: 20.0000 mg | ORAL_TABLET | Freq: Every day | ORAL | 0 refills | Status: DC

## 2021-02-05 MED ORDER — HYDROCHLOROTHIAZIDE 25 MG PO TABS: 25.0000 mg | ORAL_TABLET | Freq: Every day | ORAL | 0 refills | Status: DC

## 2021-02-05 MED ORDER — LANSOPRAZOLE 30 MG PO CPDR: 30.0000 mg | DELAYED_RELEASE_CAPSULE | Freq: Every day | ORAL | 0 refills | Status: DC

## 2021-02-05 NOTE — Progress Notes (Signed)
BP (!) 158/50 (BP Location: Left Arm, Cuff Size: Normal)   Pulse (!) 53   Temp 98.2 F (36.8 C) (Oral)   Ht 5' 5.2" (1.656 m)   Wt 177 lb 9.6 oz (80.6 kg)   SpO2 100%   BMI 29.37 kg/m    Subjective:    Patient ID: Russell Byrd, male    DOB: 11/24/1945, 75 y.o.   MRN: [id number]  HPI: Russell Byrd is a 75 y.o. male  Chief Complaint  Patient presents with   Diabetes   DIABETES Hypoglycemic episodes:no Polydipsia/polyuria: no Visual disturbance: no Chest pain: no Paresthesias: no Glucose Monitoring: no  Accucheck frequency: Not Checking Taking Insulin?: no Blood Pressure Monitoring: a few times a month Retinal Examination: Not up to Date Foot Exam: Up to Date Diabetic Education: Completed Pneumovax: Up to Date Influenza: Up to Date Aspirin: no  ANXIETY/STRESS- taking 1/2 tab in the AM and 1 tab in the PM Duration: chronic Status:controlled Anxious mood: no  Excessive worrying: no Irritability: no  Sweating: no Nausea: no Palpitations:no Hyperventilation: no Panic attacks: no Agoraphobia: no  Obscessions/compulsions: no Depressed mood: no Depression screen [facility] 2/9 11/13/2020 10/23/2020 07/18/2020 04/17/2020 01/17/2020  Decreased Interest 0 0 0 0 0  Down, Depressed, Hopeless 0 0 0 0 0  PHQ - 2 Score 0 0 0 0 0  Altered sleeping 0 0 0 - 0  Tired, decreased energy 0 0 0 - 0  Change in appetite 0 0 0 - 0  Feeling bad or failure about yourself  0 0 0 - 0  Trouble concentrating 0 0 0 - 0  Moving slowly or fidgety/restless 0 0 0 - 0  Suicidal thoughts 0 0 0 - -  PHQ-9 Score 0 0 0 - 0  Difficult doing work/chores - - Not difficult at all - -   Anhedonia: no Weight changes: no Insomnia: no   Hypersomnia: no Fatigue/loss of energy: no Feelings of worthlessness: no Feelings of guilt: no Impaired concentration/indecisiveness: no Suicidal ideations: no  Crying spells: no Recent Stressors/Life Changes: no   Relationship problems: no   Family stress: no      Financial stress: no    Job stress: no    Recent death/loss: no  Relevant past medical, surgical, family and social history reviewed and updated as indicated. Interim medical history since our last visit reviewed. Allergies and medications reviewed and updated.  Review of Systems  Constitutional: Negative.   Respiratory: Negative.    Cardiovascular: Negative.   Gastrointestinal: Negative.   Musculoskeletal: Negative.   Neurological: Negative.   Psychiatric/Behavioral: Negative.     Per HPI unless specifically indicated above     Objective:    BP (!) 158/50 (BP Location: Left Arm, Cuff Size: Normal)   Pulse (!) 53   Temp 98.2 F (36.8 C) (Oral)   Ht 5' 5.2" (1.656 m)   Wt 177 lb 9.6 oz (80.6 kg)   SpO2 100%   BMI 29.37 kg/m   Wt Readings from Last 3 Encounters:  02/05/21 177 lb 9.6 oz (80.6 kg)  11/13/20 178 lb 6.4 oz (80.9 kg)  04/17/20 179 lb (81.2 kg)    Physical Exam Vitals and nursing note reviewed.  Constitutional:      General: He is not in acute distress.    Appearance: Normal appearance. He is not ill-appearing, toxic-appearing or diaphoretic.  HENT:     Head: Normocephalic and atraumatic.     Right Ear: External ear normal.  Left Ear: External ear normal.     Nose: Nose normal.     Mouth/Throat:     Mouth: Mucous membranes are moist.     Pharynx: Oropharynx is clear.  Eyes:     General: No scleral icterus.       Right eye: No discharge.        Left eye: No discharge.     Extraocular Movements: Extraocular movements intact.     Conjunctiva/sclera: Conjunctivae normal.     Pupils: Pupils are equal, round, and reactive to light.  Cardiovascular:     Rate and Rhythm: Normal rate and regular rhythm.     Pulses: Normal pulses.     Heart sounds: Normal heart sounds. No murmur heard.   No friction rub. No gallop.  Pulmonary:     Effort: Pulmonary effort is normal. No respiratory distress.     Breath sounds: Normal breath sounds. No stridor. No  wheezing, rhonchi or rales.  Chest:     Chest wall: No tenderness.  Musculoskeletal:        General: Normal range of motion.     Cervical back: Normal range of motion and neck supple.  Skin:    General: Skin is warm and dry.     Capillary Refill: Capillary refill takes less than 2 seconds.     Coloration: Skin is not jaundiced or pale.     Findings: No bruising, erythema, lesion or rash.  Neurological:     General: No focal deficit present.     Mental Status: He is alert and oriented to person, place, and time. Mental status is at baseline.  Psychiatric:        Mood and Affect: Mood normal.        Behavior: Behavior normal.        Thought Content: Thought content normal.        Judgment: Judgment normal.    Results for orders placed or performed in visit on 11/13/20  Comprehensive metabolic panel  Result Value Ref Range   Glucose 157 (H) 65 - 99 mg/dL   BUN 21 8 - 27 mg/dL   Creatinine, Ser 1.17 0.76 - 1.27 mg/dL   eGFR 65 >59 mL/min/1.73   BUN/Creatinine Ratio 18 10 - 24   Sodium 134 134 - 144 mmol/L   Potassium 4.7 3.5 - 5.2 mmol/L   Chloride 96 96 - 106 mmol/L   CO2 21 20 - 29 mmol/L   Calcium 10.0 8.6 - 10.2 mg/dL   Total Protein 7.5 6.0 - 8.5 g/dL   Albumin 4.5 3.7 - 4.7 g/dL   Globulin, Total 3.0 1.5 - 4.5 g/dL   Albumin/Globulin Ratio 1.5 1.2 - 2.2   Bilirubin Total 0.4 0.0 - 1.2 mg/dL   Alkaline Phosphatase 94 44 - 121 IU/L   AST 14 0 - 40 IU/L   ALT 16 0 - 44 IU/L  CBC with Differential/Platelet  Result Value Ref Range   WBC 9.9 3.4 - 10.8 x10E3/uL   RBC 4.28 4.14 - 5.80 x10E6/uL   Hemoglobin 13.3 13.0 - 17.7 g/dL   Hematocrit 37.9 37.5 - 51.0 %   MCV 89 79 - 97 fL   MCH 31.1 26.6 - 33.0 pg   MCHC 35.1 31.5 - 35.7 g/dL   RDW 12.2 11.6 - 15.4 %   Platelets 210 150 - 450 x10E3/uL   Neutrophils 75 Not Estab. %   Lymphs 17 Not Estab. %   Monocytes 6 Not Estab. %  Eos 1 Not Estab. %   Basos 0 Not Estab. %   Neutrophils Absolute 7.3 (H) 1.4 - 7.0 x10E3/uL    Lymphocytes Absolute 1.7 0.7 - 3.1 x10E3/uL   Monocytes Absolute 0.6 0.1 - 0.9 x10E3/uL   EOS (ABSOLUTE) 0.1 0.0 - 0.4 x10E3/uL   Basophils Absolute 0.0 0.0 - 0.2 x10E3/uL   Immature Granulocytes 1 Not Estab. %   Immature Grans (Abs) 0.1 0.0 - 0.1 x10E3/uL  Lipid Panel w/o Chol/HDL Ratio  Result Value Ref Range   Cholesterol, Total 137 100 - 199 mg/dL   Triglycerides 167 (H) 0 - 149 mg/dL   HDL 36 (L) >39 mg/dL   VLDL Cholesterol Cal 29 5 - 40 mg/dL   LDL Chol Calc (NIH) 72 0 - 99 mg/dL  PSA  Result Value Ref Range   Prostate Specific Ag, Serum 0.3 0.0 - 4.0 ng/mL  TSH  Result Value Ref Range   TSH 3.760 0.450 - 4.500 uIU/mL  Urinalysis, Routine w reflex microscopic  Result Value Ref Range   Specific Gravity, UA 1.015 1.005 - 1.030   pH, UA 5.5 5.0 - 7.5   Color, UA Yellow Yellow   Appearance Ur Clear Clear   Leukocytes,UA Negative Negative   Protein,UA Trace (A) Negative/Trace   Glucose, UA Negative Negative   Ketones, UA Negative Negative   RBC, UA Negative Negative   Bilirubin, UA Negative Negative   Urobilinogen, Ur 0.2 0.2 - 1.0 mg/dL   Nitrite, UA Negative Negative  Bayer DCA Hb A1c Waived  Result Value Ref Range   HB A1C (BAYER DCA - WAIVED) 7.9 (H) <7.0 %  Microalbumin, Urine Waived  Result Value Ref Range   Microalb, Ur Waived 80 (H) 0 - 19 mg/L   Creatinine, Urine Waived 100 10 - 300 mg/dL   Microalb/Creat Ratio 30-300 (H) <30 mg/g  [id number] 11+Oxyco+Alc+Crt-Bund  Result Value Ref Range   Ethanol Negative Cutoff=0.020 %   Amphetamines, Urine Negative Cutoff=1000 ng/mL   Barbiturate Negative Cutoff=200 ng/mL   BENZODIAZ UR QL See Final Results Cutoff=200 ng/mL   Cannabinoid Quant, Ur Negative Cutoff=50 ng/mL   Cocaine (Metabolite) Negative Cutoff=300 ng/mL   OPIATE SCREEN URINE Negative Cutoff=300 ng/mL   Oxycodone/Oxymorphone, Urine Negative Cutoff=300 ng/mL   Phencyclidine Negative Cutoff=25 ng/mL   Methadone Screen, Urine Negative Cutoff=300 ng/mL    Propoxyphene Negative Cutoff=300 ng/mL   Meperidine Negative Cutoff=200 ng/mL   Tramadol Negative Cutoff=200 ng/mL   Creatinine 80.8 20.0 - 300.0 mg/dL   pH, Urine 5.2 4.5 - 8.9  Benzodiazepines Confirm, Urine  Result Value Ref Range   Benzodiazepines Positive (A) Cutoff=100 ng/mL   Nordiazepam Positive (A)    Nordiazepam Conf 193 Cutoff=100 ng/mL   Oxazepam Positive (A)    Oxazepam Conf 1,761 Cutoff=100 ng/mL   Flurazepam Negative Cutoff=100   Lorazepam Negative Cutoff=100   Alprazolam Negative Cutoff=100   Clonazepam Negative Cutoff=100   Temazepam Negative Cutoff=100   Triazolam Negative Cutoff=100   Midazolam Negative Cutoff=100      Assessment & Plan:   Problem List Items Addressed This Visit       Cardiovascular and Mediastinum   Hypertension associated with diabetes ([place])   Relevant Medications   metFORMIN (GLUCOPHAGE) 500 MG tablet   hydrochlorothiazide (HYDRODIURIL) 25 MG tablet   benazepril (LOTENSIN) 40 MG tablet   atorvastatin (LIPITOR) 20 MG tablet   atenolol (TENORMIN) 25 MG tablet     Endocrine   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 Will continue current regimen. Recheck 3 months. If stays below 8, will not add medication.       Relevant Medications   metFORMIN (GLUCOPHAGE) 500 MG tablet   benazepril (LOTENSIN) 40 MG tablet   atorvastatin (LIPITOR) 20 MG tablet   Other Relevant Orders   Bayer DCA Hb A1c Waived     Other   Chronic anxiety    Has done well with cutting down to 1/2 tab in the AM and 1 tab in the PM without any issues or changes in his mood. Will change to 3.13m and continue BID dosing- about 0.5tab BID. Will keep him on this for 3 months with the goal of continuing to taper off. Call with any concerns.        Relevant Medications   clorazepate (TRANXENE) 3.75 MG tablet     Follow up plan: Return in about 3 months (around 05/06/2021).

## 2021-02-05 NOTE — Assessment & Plan Note (Signed)
Slightly improved with A1c of 7.8. Will continue current regimen. Recheck 3 months. If stays below 8, will not add medication.

## 2021-02-05 NOTE — Assessment & Plan Note (Signed)
Has done well with cutting down to 1/2 tab in the AM and 1 tab in the PM without any issues or changes in his mood. Will change to 3.'75mg'$  and continue BID dosing- about 0.5tab BID. Will keep him on this for 3 months with the goal of continuing to taper off. Call with any concerns.

## 2021-02-06 LAB — BAYER DCA HB A1C WAIVED: HB A1C (BAYER DCA - WAIVED): 7.8 % — ABNORMAL HIGH (ref <7.0)

## 2021-03-19 ENCOUNTER — Telehealth: Payer: Self-pay

## 2021-03-19 NOTE — Telephone Encounter (Signed)
Copied from [id number] [id number]. Topic: Appointment Scheduling - Scheduling Inquiry for Clinic >> Mar 19, 2021 10:17 AM Holley Dexter N wrote: Reason for CRM: Pt called in stating he has spoken with someone on Saturday about a bruise he has on his arm, and that person told him he'd be able to come in today at 4. I looked on the scheduled and did not see anything available, pt requested if someone could give him a call back to get that appt. Please advise.   Please call patient to schedule an appointment for evaluation.

## 2021-03-20 ENCOUNTER — Ambulatory Visit (INDEPENDENT_AMBULATORY_CARE_PROVIDER_SITE_OTHER): Payer: PPO | Admitting: Internal Medicine

## 2021-03-20 ENCOUNTER — Other Ambulatory Visit: Payer: Self-pay

## 2021-03-20 ENCOUNTER — Encounter: Payer: Self-pay | Admitting: Internal Medicine

## 2021-03-20 ENCOUNTER — Emergency Department
Admission: EM | Admit: 2021-03-20 | Discharge: 2021-03-20 | Disposition: A | Discharge status: Home / Self Care | Payer: PPO | Attending: Emergency Medicine | Admitting: Emergency Medicine

## 2021-03-20 ENCOUNTER — Emergency Department: Payer: PPO

## 2021-03-20 VITALS — BP 140/60 | HR 78 | Temp 98.6°F | Ht 65.2 in | Wt 177.0 lb

## 2021-03-20 DIAGNOSIS — I10 Essential (primary) hypertension: Secondary | ICD-10-CM | POA: Insufficient documentation

## 2021-03-20 DIAGNOSIS — M7989 Other specified soft tissue disorders: Secondary | ICD-10-CM | POA: Diagnosis not present

## 2021-03-20 DIAGNOSIS — S46211A Strain of muscle, fascia and tendon of other parts of biceps, right arm, initial encounter: Secondary | ICD-10-CM | POA: Diagnosis not present

## 2021-03-20 DIAGNOSIS — R001 Bradycardia, unspecified: Secondary | ICD-10-CM | POA: Diagnosis not present

## 2021-03-20 DIAGNOSIS — R609 Edema, unspecified: Secondary | ICD-10-CM

## 2021-03-20 DIAGNOSIS — X500XXA Overexertion from strenuous movement or load, initial encounter: Secondary | ICD-10-CM | POA: Insufficient documentation

## 2021-03-20 DIAGNOSIS — E119 Type 2 diabetes mellitus without complications: Secondary | ICD-10-CM | POA: Diagnosis not present

## 2021-03-20 DIAGNOSIS — T148XXA Other injury of unspecified body region, initial encounter: Secondary | ICD-10-CM | POA: Diagnosis not present

## 2021-03-20 DIAGNOSIS — Z7984 Long term (current) use of oral hypoglycemic drugs: Secondary | ICD-10-CM | POA: Insufficient documentation

## 2021-03-20 DIAGNOSIS — F17220 Nicotine dependence, chewing tobacco, uncomplicated: Secondary | ICD-10-CM | POA: Diagnosis not present

## 2021-03-20 DIAGNOSIS — R2231 Localized swelling, mass and lump, right upper limb: Secondary | ICD-10-CM | POA: Diagnosis present

## 2021-03-20 MED ORDER — MUPIROCIN CALCIUM 2 % EX CREA: 1.0000 "application " | TOPICAL_CREAM | Freq: Two times a day (BID) | CUTANEOUS | 1 refills | Status: DC
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
## 2021-03-20 NOTE — ED Triage Notes (Signed)
While pt was sitting in chair his HR went from 28 to 141. He denies having any heart issues. Did an EKG to see what his rhythm and heart rate truly is

## 2021-03-20 NOTE — Telephone Encounter (Signed)
LMOM to schedule appt, asked pt to call back to reschedule.

## 2021-03-20 NOTE — Telephone Encounter (Signed)
Spoke with pt and got him scheduled today at 4 pm.

## 2021-03-20 NOTE — ED Provider Notes (Signed)
[facility] Emergency Department Provider Note  ____________________________________________   I have reviewed the triage vital signs and the nursing notes.   HISTORY  Chief Complaint Right arm swelling   History limited by: Not Limited   HPI Russell Byrd is a 75 y.o. male who presents to the emergency department today because of concern for right upper arm swelling and bruising. The patient says that 5 days ago he was in bed and when he pulled on the cover he felt a pain to his right bicep. The next day he noticed some swelling to that area. He then noticed some bruising. The bruising has increased and has started to move down his arm. He does have some pain in the bicep when he fully flexes it.   Records reviewed. Per medical record review patient has a history of DM, HLD, HTN.  Past Medical History:  Diagnosis Date   Diabetes mellitus without complication ([place])    History of kidney stones    Hyperlipidemia    Hypertension     Patient Active Problem List   Diagnosis Date Noted   Controlled substance agreement signed 11/13/2020   Advanced care planning/counseling discussion 10/13/2017   Hypertension associated with diabetes ([place]) 07/15/2017   Hyperlipidemia 07/15/2017   Diabetes mellitus associated with hormonal etiology ([place]) 07/15/2017   BPH (benign prostatic hyperplasia) 10/07/2016   Primary osteoarthritis of right knee 04/04/2016   Chronic anxiety 04/03/2015    Past Surgical History:  Procedure Laterality Date   KNEE SURGERY Bilateral    arthroscopic    Prior to Admission medications   Medication Sig Start Date End Date Taking? Authorizing Provider  Acetaminophen (TYLENOL 8 HOUR PO) Take by mouth daily.    [provider]  atenolol (TENORMIN) 25 MG tablet Take 1 tablet (25 mg total) by mouth 2 (two) times daily. 02/05/21   Johnson, Megan P, DO  atorvastatin (LIPITOR) 20 MG tablet Take 1 tablet (20 mg total) by mouth daily.  02/05/21   Johnson, Megan P, DO  benazepril (LOTENSIN) 40 MG tablet Take 1 tablet (40 mg total) by mouth daily. 02/05/21   Johnson, Megan P, DO  blood glucose meter kit and supplies KIT Dispense based on patient and insurance preference; One touch. Use up to three times daily as directed. (FOR ICD-9 250.00, 250.01). 04/17/20   Johnson, Megan P, DO  clorazepate (TRANXENE) 3.75 MG tablet Take 1 tablet (3.75 mg total) by mouth 2 (two) times daily. 02/05/21   Johnson, Megan P, DO  hydrochlorothiazide (HYDRODIURIL) 25 MG tablet Take 1 tablet (25 mg total) by mouth daily. 02/05/21   Park Liter P, DO  Ibuprofen 200 MG CAPS Take by mouth.    [provider]  lansoprazole (PREVACID) 30 MG capsule Take 1 capsule (30 mg total) by mouth daily at 12 noon. 02/05/21   Park Liter P, DO  metFORMIN (GLUCOPHAGE) 500 MG tablet Take 2 tablets (1,000 mg total) by mouth 2 (two) times daily with a meal. 02/05/21   Johnson, Megan P, DO  Misc Natural Products (OSTEO BI-FLEX/5-LOXIN ADVANCED PO) Take by mouth.    [provider]  Multiple Vitamins-Minerals (CENTRUM SILVER ADULT 50+) TABS Take by mouth daily.    [provider]  mupirocin cream (BACTROBAN) 2 % Apply 1 application topically 2 (two) times daily. 03/20/21   Charlynne Cousins, MD  OneTouch Delica Lancets 01S MISC Apply topically. 05/10/20   [provider]  ONETOUCH ULTRA test strip 1 each 2 (two) times daily.  05/10/20   [provider]  vitamin E 400 UNIT capsule Take 400 Units by mouth daily.    [provider]  Zinc 50 MG CAPS Take 50 mg by mouth daily.    [provider]    Allergies Prednisone, Tetracycline, Tetracyclines & related, and Tramadol  Family History  Problem Relation Age of Onset   Diabetes Father    Heart disease Father    Diabetes Mother     Social History Social History   Tobacco Use   Smoking status: Never   Smokeless tobacco: Current    Types: Chew  Vaping Use   Vaping Use:  Never used  Substance Use Topics   Alcohol use: Not Currently   Drug use: No    Review of Systems Constitutional: No fever/chills Eyes: No visual changes. ENT: No sore throat. Cardiovascular: Denies chest pain. Respiratory: Denies shortness of breath. Gastrointestinal: No abdominal pain.  No nausea, no vomiting.  No diarrhea.   Genitourinary: Negative for dysuria. Musculoskeletal: Positive for right upper arm swelling and bruising with some pain with full flexion. Skin: Positive for bruising to right upper arm.  Neurological: Negative for headaches, focal weakness or numbness.  ____________________________________________   PHYSICAL EXAM:  VITAL SIGNS: ED Triage Vitals  Enc Vitals Group     BP 03/20/21 1713 (!) 190/64     Pulse Rate 03/20/21 1723 (!) 59     Resp 03/20/21 1710 20     Temp 03/20/21 1710 98.4 F (36.9 C)     Temp Source 03/20/21 1710 Oral     SpO2 03/20/21 1713 97 %     Weight 03/20/21 1710 177 lb (80.3 kg)     Height 03/20/21 1710 '5\' 5"'  (1.651 m)     Head Circumference --      Peak Flow --      Pain Score 03/20/21 1710 0    Constitutional: Alert and oriented.  Eyes: Conjunctivae are normal.  ENT      Head: Normocephalic and atraumatic.      Nose: No congestion/rhinnorhea.      Mouth/Throat: Mucous membranes are moist.      Neck: No stridor. Hematological/Lymphatic/Immunilogical: No cervical lymphadenopathy. Cardiovascular: Normal rate, regular rhythm.  No murmurs, rubs, or gallops.  Respiratory: Normal respiratory effort without tachypnea nor retractions. Breath sounds are clear and equal bilaterally. No wheezes/rales/rhonchi. Gastrointestinal: Soft and non tender. No rebound. No guarding.  Genitourinary: Deferred Musculoskeletal: Right upper arm with swelling and bruising noted. Full ROM of the right upper arm. Radial and ulnar pulse 2+. Neurologic:  Normal speech and language. No gross focal neurologic deficits are appreciated.  Skin:  Skin is  warm, dry and intact. No rash noted. Psychiatric: Mood and affect are normal. Speech and behavior are normal. Patient exhibits appropriate insight and judgment.  ____________________________________________    LABS (pertinent positives/negatives)  None  ____________________________________________   EKG  I, Nance Pear, attending physician, personally viewed and interpreted this EKG  EKG Time: 1716 Rate: 57 Rhythm: sinus bradycardia with PACs in bigeminal pattern Axis: normal Intervals: qtc 408 QRS: narrow, q waves v1, v2, v3 ST changes: no st elevation Impression: abnormal ekg ____________________________________________    RADIOLOGY  [place] right upper ext No DVT. Small fluid collection along muscle of right upper extremity.  ____________________________________________   PROCEDURES  Procedures  ____________________________________________   INITIAL IMPRESSION / ASSESSMENT AND PLAN / ED COURSE  Pertinent labs & imaging results that were available during my care of the patient were reviewed  by me and considered in my medical decision making (see chart for details).   Patient presented to the emergency department today because of concerns for swelling and bruising to his right upper arm.  Ultrasound was performed from triage which did not show any DVT but was concerning for possible bicep tear and associated bleeding and hematoma.  I do think this makes sense as to what is causing the swelling and the bruising.  It did start when he was trying to pull a cover. Discussed this with the patient. Discussed expected course of hematoma and bruising. Will give orthopedic follow up information if bicep does not feel better over the next few days.   ____________________________________________   FINAL CLINICAL IMPRESSION(S) / ED DIAGNOSES  Final diagnoses:  Swelling  Tear of right biceps muscle, initial encounter  Hematoma     Note: This dictation was prepared with  Dragon dictation. Any transcriptional errors that result from this process are unintentional     Nance Pear, MD 03/20/21 2119

## 2021-03-20 NOTE — Telephone Encounter (Signed)
Pt returned call during lunch, requesting a call back

## 2021-03-20 NOTE — Discharge Instructions (Addendum)
Please seek medical attention for any high fevers, chest pain, shortness of breath, change in behavior, persistent vomiting, bloody stool or any other new or concerning symptoms.  

## 2021-03-20 NOTE — Progress Notes (Signed)
There were no vitals taken for this visit.   Subjective:    Patient ID: Russell Byrd, male    DOB: Feb 01, 1946, 75 y.o.   MRN: [id number]  No chief complaint on file.   HPI: Russell Byrd is a 74 y.o. male  Pt says he noticed a bruise on Thursday night and c/o pain in the right deltoid area Friday noticed this got worse. Co numbness in bil fingers    No chief complaint on file.   Relevant past medical, surgical, family and social history reviewed and updated as indicated. Interim medical history since our last visit reviewed. Allergies and medications reviewed and updated.  Review of Systems  Constitutional:  Negative for activity change, appetite change, chills, fatigue and fever.  HENT:  Negative for congestion, ear discharge, ear pain and facial swelling.   Eyes:  Negative for pain, discharge and itching.  Respiratory:  Negative for cough, chest tightness, shortness of breath and wheezing.   Cardiovascular:  Negative for chest pain, palpitations and leg swelling.  Gastrointestinal:  Negative for abdominal distention, abdominal pain, blood in stool, constipation, diarrhea, nausea and vomiting.  Endocrine: Negative for cold intolerance, heat intolerance, polydipsia, polyphagia and polyuria.  Genitourinary:  Negative for difficulty urinating, dysuria, flank pain, frequency, hematuria and urgency.  Musculoskeletal:  Negative for arthralgias, gait problem, joint swelling and myalgias.  Skin:  Negative for color change, rash and wound.  Neurological:  Negative for dizziness, tremors, speech difficulty, weakness, light-headedness, numbness and headaches.  Hematological:  Does not bruise/bleed easily.  Psychiatric/Behavioral:  Negative for agitation, confusion, decreased concentration, sleep disturbance and suicidal ideas.    Per HPI unless specifically indicated above     Objective:    There were no vitals taken for this visit.  Wt Readings from Last 3 Encounters:   02/05/21 177 lb 9.6 oz (80.6 kg)  11/13/20 178 lb 6.4 oz (80.9 kg)  04/17/20 179 lb (81.2 kg)    Physical Exam Vitals and nursing note reviewed.  Constitutional:      General: He is not in acute distress.    Appearance: Normal appearance. He is not ill-appearing or diaphoretic.  Musculoskeletal:        General: Swelling, tenderness and deformity present.     Cervical back: No rigidity or tenderness.     Left lower leg: No edema.     Comments: Brusing yellowish blue discoloration noted around biceps area  Skin:    Findings: Bruising present.  Neurological:     Mental Status: He is alert.    Results for orders placed or performed in visit on 02/05/21  Bayer DCA Hb A1c Waived  Result Value Ref Range   HB A1C (BAYER DCA - WAIVED) 7.8 (H) <7.0 %        Current Outpatient Medications:    Acetaminophen (TYLENOL 8 HOUR PO), Take by mouth daily., Disp: , Rfl:    aspirin EC 81 MG tablet, Take 81 mg by mouth daily., Disp: , Rfl:    atenolol (TENORMIN) 25 MG tablet, Take 1 tablet (25 mg total) by mouth 2 (two) times daily., Disp: 180 tablet, Rfl: 0   atorvastatin (LIPITOR) 20 MG tablet, Take 1 tablet (20 mg total) by mouth daily., Disp: 90 tablet, Rfl: 0   benazepril (LOTENSIN) 40 MG tablet, Take 1 tablet (40 mg total) by mouth daily., Disp: 90 tablet, Rfl: 0   blood glucose meter kit and supplies KIT, Dispense based on patient and insurance preference; One touch. Use  up to three times daily as directed. (FOR ICD-9 250.00, 250.01)., Disp: 1 each, Rfl: 12   clorazepate (TRANXENE) 3.75 MG tablet, Take 1 tablet (3.75 mg total) by mouth 2 (two) times daily., Disp: 60 tablet, Rfl: 2   hydrochlorothiazide (HYDRODIURIL) 25 MG tablet, Take 1 tablet (25 mg total) by mouth daily., Disp: 90 tablet, Rfl: 0   Ibuprofen 200 MG CAPS, Take by mouth., Disp: , Rfl:    lansoprazole (PREVACID) 30 MG capsule, Take 1 capsule (30 mg total) by mouth daily at 12 noon., Disp: 90 capsule, Rfl: 0   metFORMIN  (GLUCOPHAGE) 500 MG tablet, Take 2 tablets (1,000 mg total) by mouth 2 (two) times daily with a meal., Disp: 360 tablet, Rfl: 0   Misc Natural Products (OSTEO BI-FLEX/5-LOXIN ADVANCED PO), Take by mouth., Disp: , Rfl:    Multiple Vitamins-Minerals (CENTRUM SILVER ADULT 50+) TABS, Take by mouth daily., Disp: , Rfl:    OneTouch Delica Lancets 30Z MISC, Apply topically., Disp: , Rfl:    ONETOUCH ULTRA test strip, 1 each 2 (two) times daily., Disp: , Rfl:    vitamin E 400 UNIT capsule, Take 400 Units by mouth daily., Disp: , Rfl:    Zinc 50 MG CAPS, Take 50 mg by mouth daily., Disp: , Rfl:     Assessment & Plan:  Right arm pain / swelling will need to go to the ER for further wu and mx.  Pt has a new onset bruising / swelling of the biceps muscle ? Tear vs hematoma  Pt verbalizes understanding of the above.  Will go to the ER.  Stop/ hold ASA  Left index finger  ? superficial infection of the left index finger :mupirocin sent to pharmacy for such   Problem List Items Addressed This Visit   None    No orders of the defined types were placed in this encounter.    Meds ordered this encounter  Medications   mupirocin cream (BACTROBAN) 2 %    Sig: Apply 1 application topically 2 (two) times daily.    Dispense:  15 g    Refill:  1     Follow up plan: No follow-ups on file.

## 2021-03-22 ENCOUNTER — Encounter: Payer: Self-pay | Admitting: Internal Medicine

## 2021-03-22 DIAGNOSIS — T148XXA Other injury of unspecified body region, initial encounter: Secondary | ICD-10-CM | POA: Insufficient documentation

## 2021-03-22 DIAGNOSIS — S46211A Strain of muscle, fascia and tendon of other parts of biceps, right arm, initial encounter: Secondary | ICD-10-CM | POA: Insufficient documentation

## 2021-05-02 ENCOUNTER — Other Ambulatory Visit: Payer: Self-pay

## 2021-05-02 ENCOUNTER — Encounter: Payer: Self-pay | Admitting: Emergency Medicine

## 2021-05-02 ENCOUNTER — Emergency Department: Payer: PPO

## 2021-05-02 DIAGNOSIS — M79652 Pain in left thigh: Secondary | ICD-10-CM | POA: Diagnosis not present

## 2021-05-02 DIAGNOSIS — F17220 Nicotine dependence, chewing tobacco, uncomplicated: Secondary | ICD-10-CM | POA: Insufficient documentation

## 2021-05-02 DIAGNOSIS — K449 Diaphragmatic hernia without obstruction or gangrene: Secondary | ICD-10-CM | POA: Diagnosis not present

## 2021-05-02 DIAGNOSIS — C7A029 Malignant carcinoid tumor of the large intestine, unspecified portion: Secondary | ICD-10-CM | POA: Insufficient documentation

## 2021-05-02 DIAGNOSIS — Z87442 Personal history of urinary calculi: Secondary | ICD-10-CM | POA: Diagnosis not present

## 2021-05-02 DIAGNOSIS — K4090 Unilateral inguinal hernia, without obstruction or gangrene, not specified as recurrent: Secondary | ICD-10-CM | POA: Diagnosis not present

## 2021-05-02 DIAGNOSIS — Z7984 Long term (current) use of oral hypoglycemic drugs: Secondary | ICD-10-CM | POA: Diagnosis not present

## 2021-05-02 DIAGNOSIS — E119 Type 2 diabetes mellitus without complications: Secondary | ICD-10-CM | POA: Diagnosis not present

## 2021-05-02 DIAGNOSIS — I10 Essential (primary) hypertension: Secondary | ICD-10-CM | POA: Insufficient documentation

## 2021-05-02 DIAGNOSIS — K5730 Diverticulosis of large intestine without perforation or abscess without bleeding: Secondary | ICD-10-CM | POA: Diagnosis not present

## 2021-05-02 DIAGNOSIS — Z79899 Other long term (current) drug therapy: Secondary | ICD-10-CM | POA: Diagnosis not present

## 2021-05-02 LAB — URINALYSIS, ROUTINE W REFLEX MICROSCOPIC
Bacteria, UA: NONE SEEN
Bilirubin Urine: NEGATIVE
Glucose, UA: NEGATIVE mg/dL
Hgb urine dipstick: NEGATIVE
Ketones, ur: NEGATIVE mg/dL
Leukocytes,Ua: NEGATIVE
Nitrite: NEGATIVE
Protein, ur: 30 mg/dL — AB
Specific Gravity, Urine: 1.019 (ref 1.005–1.030)
Squamous Epithelial / HPF: NONE SEEN (ref 0–5)
pH: 5 (ref 5.0–8.0)

## 2021-05-02 LAB — CBC
HCT: 39.8 % (ref 39.0–52.0)
Hemoglobin: 14.2 g/dL (ref 13.0–17.0)
MCH: 31.2 pg (ref 26.0–34.0)
MCHC: 35.7 g/dL (ref 30.0–36.0)
MCV: 87.5 fL (ref 80.0–100.0)
Platelets: 215 10*3/uL (ref 150–400)
RBC: 4.55 MIL/uL (ref 4.22–5.81)
RDW: 12.1 % (ref 11.5–15.5)
WBC: 12.8 10*3/uL — ABNORMAL HIGH (ref 4.0–10.5)
nRBC: 0 % (ref 0.0–0.2)

## 2021-05-02 LAB — BASIC METABOLIC PANEL
Anion gap: 8 (ref 5–15)
BUN: 22 mg/dL (ref 8–23)
CO2: 26 mmol/L (ref 22–32)
Calcium: 9.5 mg/dL (ref 8.9–10.3)
Chloride: 98 mmol/L (ref 98–111)
Creatinine, Ser: 1.17 mg/dL (ref 0.61–1.24)
GFR, Estimated: >60 mL/min (ref >60)
Glucose, Bld: 181 mg/dL — ABNORMAL HIGH (ref 70–99)
Potassium: 4.6 mmol/L (ref 3.5–5.1)
Sodium: 132 mmol/L — ABNORMAL LOW (ref 135–145)

## 2021-05-02 NOTE — ED Notes (Signed)
Needs urine, no urine in lab per Elmyra Ricks

## 2021-05-02 NOTE — ED Triage Notes (Signed)
Pt arrived via POV with c/o L flank pain since Sunday radiating to L groin. Pt has hx of kidney stones, feels similar.

## 2021-05-03 ENCOUNTER — Emergency Department
Admission: EM | Admit: 2021-05-03 | Discharge: 2021-05-03 | Disposition: A | Discharge status: Home / Self Care | Payer: PPO | Attending: Emergency Medicine | Admitting: Emergency Medicine

## 2021-05-03 DIAGNOSIS — D3A023 Benign carcinoid tumor of the transverse colon: Secondary | ICD-10-CM

## 2021-05-03 DIAGNOSIS — M79652 Pain in left thigh: Secondary | ICD-10-CM

## 2021-05-03 MED ORDER — KETOROLAC TROMETHAMINE 30 MG/ML IJ SOLN
30.0000 mg | Freq: Once | INTRAMUSCULAR | Status: AC
  Administered 2021-05-03: 30 mg via INTRAMUSCULAR
  Filled 2021-05-03: qty 1

## 2021-05-03 MED ORDER — OXYCODONE-ACETAMINOPHEN 5-325 MG PO TABS: 2.0000 | ORAL_TABLET | Freq: Four times a day (QID) | ORAL | 0 refills | Status: DC | PRN

## 2021-05-03 MED ORDER — DOCUSATE SODIUM 100 MG PO CAPS: ORAL_CAPSULE | ORAL | 0 refills | Status: DC

## 2021-05-03 NOTE — Discharge Instructions (Addendum)
As we discussed, we did not identify a specific cause for the pain you are experiencing.  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  Sometimes these can be benign and other times they are malignant.  You need to have a follow up appointment with a specialist for further evaluation.  We sent a message to Dr. Janese Banks with the oncology team, and we hope they will reach out to you to schedule a follow up appointment.  However, you should keep your appointment with your primary care doctor and let her know about these findings; she may be able to help arrange follow up with oncology as well.  Please continue using your regular over-the-counter pain medication as needed.  Take Percocet as prescribed for severe pain. Do not drink alcohol, drive or participate in any other potentially dangerous activities while taking this medication as it may make you sleepy. Do not take this medication with any other sedating medications, either prescription or over-the-counter. If you were prescribed Percocet or Vicodin, do not take these with acetaminophen (Tylenol) as it is already contained within these medications.   This medication is an opiate (or narcotic) pain medication and can be habit forming.  Use it as little as possible to achieve adequate pain control.  Do not use or use it with extreme caution if you have a history of opiate abuse or dependence.  If you are on a pain contract with your primary care doctor or a pain specialist, be sure to let them know you were prescribed this medication today from the [facility].  This medication is intended for your use only - do not give any to anyone else and keep it in a secure place where nobody else, especially children, have access to it.  It will also cause or worsen constipation, so you may want to consider taking an over-the-counter stool softener while you are taking this  medication.    Return to the emergency department if you develop new or worsening symptoms that concern you.

## 2021-05-03 NOTE — ED Provider Notes (Signed)
[facility] Emergency Department Provider Note  ____________________________________________   Event Date/Time   First MD Initiated Contact with Patient 05/03/21 0230     (approximate)  I have reviewed the triage vital signs and the nursing notes.   HISTORY  Chief Complaint Flank Pain    HPI Russell Byrd is a 75 y.o. male with medical history as listed below who presents for evaluation of about 3 days of pain in the left side of his abdomen and groin that radiates into the top of his left thigh.  He states that most of the pain is actually in the top part of his left thigh accompanied with some numbness.  He is having no back pain.  He denies upper abdominal pain.  He denies fever, sore throat, chest pain, shortness of breath, nausea, vomiting, diarrhea, and constipation.  He has not noted any blood in his stool nor in his urine.  Lying down makes it better, ambulating seems to make a little bit worse.  No pain specifically in his testicles or scrotum and no pain on the right side.  No recent weight loss of which he is aware.  No history of cancer.  No episodes of palpitations or difficulty breathing.  No recent flushing or passing out.  He reports the pain is severe and over-the-counter pain medication is not helping.  It feels similar to his prior kidney stone but a little bit different as well.     Past Medical History:  Diagnosis Date   Diabetes mellitus without complication ([place])    History of kidney stones    Hyperlipidemia    Hypertension     Patient Active Problem List   Diagnosis Date Noted   Bruising 03/22/2021   Strain of right biceps muscle 03/22/2021   Controlled substance agreement signed 11/13/2020   Advanced care planning/counseling discussion 10/13/2017   Hypertension associated with diabetes ([place]) 07/15/2017   Hyperlipidemia 07/15/2017   Diabetes mellitus associated with hormonal etiology ([place]) 07/15/2017   BPH (benign  prostatic hyperplasia) 10/07/2016   Primary osteoarthritis of right knee 04/04/2016   Chronic anxiety 04/03/2015    Past Surgical History:  Procedure Laterality Date   KNEE SURGERY Bilateral    arthroscopic    Prior to Admission medications   Medication Sig Start Date End Date Taking? Authorizing Provider  docusate sodium (COLACE) 100 MG capsule Take 1 tablet once or twice daily as needed for constipation while taking narcotic pain medicine 05/03/21  Yes Hinda Kehr, MD  oxyCODONE-acetaminophen (PERCOCET) 5-325 MG tablet Take 2 tablets by mouth every 6 (six) hours as needed for severe pain. 05/03/21  Yes Hinda Kehr, MD  Acetaminophen (TYLENOL 8 HOUR PO) Take by mouth daily.    [provider]  atenolol (TENORMIN) 25 MG tablet Take 1 tablet (25 mg total) by mouth 2 (two) times daily. 02/05/21   Johnson, Megan P, DO  atorvastatin (LIPITOR) 20 MG tablet Take 1 tablet (20 mg total) by mouth daily. 02/05/21   Johnson, Megan P, DO  benazepril (LOTENSIN) 40 MG tablet Take 1 tablet (40 mg total) by mouth daily. 02/05/21   Johnson, Megan P, DO  blood glucose meter kit and supplies KIT Dispense based on patient and insurance preference; One touch. Use up to three times daily as directed. (FOR ICD-9 250.00, 250.01). 04/17/20   Johnson, Megan P, DO  clorazepate (TRANXENE) 3.75 MG tablet Take 1 tablet (3.75 mg total) by mouth 2 (two) times daily. 02/05/21   Wynetta Emery,  Megan P, DO  hydrochlorothiazide (HYDRODIURIL) 25 MG tablet Take 1 tablet (25 mg total) by mouth daily. 02/05/21   Park Liter P, DO  Ibuprofen 200 MG CAPS Take by mouth.    [provider]  lansoprazole (PREVACID) 30 MG capsule Take 1 capsule (30 mg total) by mouth daily at 12 noon. 02/05/21   Park Liter P, DO  metFORMIN (GLUCOPHAGE) 500 MG tablet Take 2 tablets (1,000 mg total) by mouth 2 (two) times daily with a meal. 02/05/21   Johnson, Megan P, DO  Misc Natural Products (OSTEO BI-FLEX/5-LOXIN ADVANCED PO) Take by mouth.     [provider]  Multiple Vitamins-Minerals (CENTRUM SILVER ADULT 50+) TABS Take by mouth daily.    [provider]  mupirocin cream (BACTROBAN) 2 % Apply 1 application topically 2 (two) times daily. 03/20/21   Charlynne Cousins, MD  OneTouch Delica Lancets 03P MISC Apply topically. 05/10/20   [provider]  ONETOUCH ULTRA test strip 1 each 2 (two) times daily. 05/10/20   [provider]  vitamin E 400 UNIT capsule Take 400 Units by mouth daily.    [provider]  Zinc 50 MG CAPS Take 50 mg by mouth daily.    [provider]    Allergies Prednisone, Tetracycline, Tetracyclines & related, and Tramadol  Family History  Problem Relation Age of Onset   Diabetes Father    Heart disease Father    Diabetes Mother     Social History Social History   Tobacco Use   Smoking status: Never   Smokeless tobacco: Current    Types: Chew  Vaping Use   Vaping Use: Never used  Substance Use Topics   Alcohol use: Not Currently   Drug use: No    Review of Systems Constitutional: No fever/chills.  No feelings of being flushed, no near syncope or syncope. Eyes: No visual changes. ENT: No sore throat. Cardiovascular: Denies chest pain. Respiratory: Denies shortness of breath. Gastrointestinal: Pain and the left side of abdomen down into the left groin and left upper thigh.  No nausea, no vomiting.  No diarrhea.  No constipation. Genitourinary: Negative for dysuria. Musculoskeletal: Pain in anterior left upper thigh.  Negative for neck pain.  Negative for back pain. Integumentary: Negative for rash. Neurological: Negative for headaches, focal weakness or numbness.   ____________________________________________   PHYSICAL EXAM:  VITAL SIGNS: ED Triage Vitals  Enc Vitals Group     BP 05/02/21 1912 (!) 184/88     Pulse Rate 05/02/21 1912 62     Resp 05/02/21 1912 18     Temp 05/02/21 1912 99.2 F (37.3 C)     Temp Source 05/02/21 1912  Oral     SpO2 05/02/21 1912 95 %     Weight 05/02/21 1913 77.1 kg (170 lb)     Height 05/02/21 1913 1.702 m (_0 )     Head Circumference --      Peak Flow --      Pain Score 05/02/21 1912 10     Pain Loc --      Pain Edu? --      Excl. in [place]? --     Constitutional: Alert and oriented.  Eyes: Conjunctivae are normal.  Head: Atraumatic. Nose: No congestion/rhinnorhea. Mouth/Throat: Patient is wearing a mask. Neck: No stridor.  No meningeal signs.   Cardiovascular: Normal rate, regular rhythm. Good peripheral circulation. Respiratory: Normal respiratory effort.  No retractions. Gastrointestinal: Soft and nontender. No distention.  Musculoskeletal: No  lower extremity tenderness nor edema. No gross deformities of extremities. Neurologic:  Normal speech and language. No gross focal neurologic deficits are appreciated.  Skin:  Skin is warm, dry and intact. Psychiatric: Mood and affect are irritable due to his long wait in the emergency department, otherwise unremarkable.  ____________________________________________   LABS (all labs ordered are listed, but only abnormal results are displayed)  Labs Reviewed  URINALYSIS, ROUTINE W REFLEX MICROSCOPIC - Abnormal; Notable for the following components:      Result Value   Color, Urine YELLOW (*)    APPearance HAZY (*)    Protein, ur 30 (*)    All other components within normal limits  CBC - Abnormal; Notable for the following components:   WBC 12.8 (*)    All other components within normal limits  BASIC METABOLIC PANEL - Abnormal; Notable for the following components:   Sodium 132 (*)    Glucose, Bld 181 (*)    All other components within normal limits   ____________________________________________   RADIOLOGY I, Hinda Kehr, personally viewed and evaluated these images (plain radiographs) as part of my medical decision making, as well as reviewing the written report by the radiologist.  ED MD interpretation: Concerning for  a 5 x 4.5 cm mesenteric mass suggestive of carcinoid tumor.  No other acute abnormalities noted.   Official radiology report(s): CT Renal Stone Study  Result Date: 05/02/2021 CLINICAL DATA:  Flank pain, kidney stone suspected. L groin. Pt has hx of kidney stones, feels similar. EXAM: CT ABDOMEN AND PELVIS WITHOUT CONTRAST TECHNIQUE: Multidetector CT imaging of the abdomen and pelvis was performed following the standard protocol without IV contrast. COMPARISON:  CT stone 10/02/1998 FINDINGS: Lower chest: Coronary artery calcifications.  Tiny hiatal hernia. Hepatobiliary: Punctate calcifications likely sequela prior granulomatous disease. No focal liver abnormality. No gallstones, gallbladder wall thickening, or pericholecystic fluid. No biliary dilatation. Pancreas: No focal lesion. Normal pancreatic contour. No surrounding inflammatory changes. No main pancreatic ductal dilatation. Spleen: Normal in size without focal abnormality. Adrenals/Urinary Tract: No right adrenal gland nodule. There is a slightly increased in size 1.5 cm left adrenal gland nodule with a density of 10 Hounsfield units likely representing an adenoma. Nonspecific bilateral perinephric stranding. No nephrolithiasis and no hydronephrosis. No definite contour-deforming renal mass. No ureterolithiasis or hydroureter. The urinary bladder is unremarkable. Stomach/Bowel: Stomach is within normal limits. No evidence of bowel wall thickening or dilatation. Scattered colonic diverticulosis. Appendix appears normal. Vascular/Lymphatic: No abdominal aorta or iliac aneurysm. Severe atherosclerotic plaque of the aorta and its branches. No abdominal, pelvic, or inguinal lymphadenopathy. Reproductive: Prostate is unremarkable. Other: Coarsely calcified at head of the ring mesenteric mass measuring 5 x 4.5 cm centered within the upper anterior abdomen appearing inseparable from the posterior wall of the mid transverse colon. No intraperitoneal free  fluid. No intraperitoneal free gas. No organized fluid collection. Musculoskeletal: Small volume right inguinal hernia containing fat. Right the testis is noted within the inguinal canal. No suspicious lytic or blastic osseous lesions. No acute displaced fracture. Multilevel degenerative changes of the spine. IMPRESSION: 1. A 5 x 4.5 cm calcified mesenteric mass that appears in several from the transverse colon suggestive of carcinoid tumor. 2. Right testis within the right inguinal canal. Please correlate with retractile versus less likely undescended testis. 3. Small volume fat containing right inguinal hernia. 4. Tiny hiatal hernia. 5. Scattered colonic diverticulosis with no acute diverticulitis. 6.  Aortic Atherosclerosis (ICD10-I70.0). Electronically Signed   By: Clelia Croft.D.  On: 05/02/2021 21:50    ____________________________________________   PROCEDURES   Procedure(s) performed (including Critical Care):  Procedures   ____________________________________________   INITIAL IMPRESSION / MDM / [place] / ED COURSE  As part of my medical decision making, I reviewed the following data within the [place] notes reviewed and incorporated, Labs reviewed , Old chart reviewed, and Notes from prior ED visits   Differential diagnosis includes, but is not limited to, renal colic, ureteral colic, intra-abdominal infection, intra-abdominal mass, meralgia paresthetica on the left, spinal cord impingement or disc protrusion.  Vital signs are stable other than hypertension and the patient has been waiting a very long time to be seen and not taken his evening medications.  No focal neurological deficits.  No tender to palpation of the abdomen.  Symptoms seem most consistent with ureteral colic but there is no evidence of ureteral/renal stone on CT scan.  However he has a large mesenteric mass in his transverse colon concerning for carcinoid tumor.  He  was unaware of this finding.  Lab work is generally reassuring with an essentially normal basic metabolic panel other than a slightly decreased sodium.  CBC is within normal limits other than a very mild leukocytosis.  Urinalysis is unremarkable.  I had an extensive conversation with the patient about the presence of the mass in his colon and how it is suggestive of a particular type of cancer (carcinoid tumor), but I stressed multiple times that I am NOT giving him a specific diagnosis.  I explained that close follow-up is very important.  I sent a [facility] message to Dr. Janese Banks with oncology and asked her if she could reach out to him to schedule a follow-up appointment.  The patient also has an established PCP, Dr. Park Liter, who may be able to help him follow-up.  No emergent findings or indication for admission tonight.  I provided Percocet for his acute pain and encouraged close follow-up.  I gave my usual and customary return precautions and the patient states that he understands and agrees.  He drove himself tonight so I administered Toradol 30 mg intramuscular prior to discharge.           ____________________________________________  FINAL CLINICAL IMPRESSION(S) / ED DIAGNOSES  Final diagnoses:  Carcinoid tumor of transverse colon, unspecified whether malignant  Left thigh pain     MEDICATIONS GIVEN DURING THIS VISIT:  Medications  ketorolac (TORADOL) 30 MG/ML injection 30 mg (30 mg Intramuscular Given 05/03/21 0310)     ED Discharge Orders          Ordered    oxyCODONE-acetaminophen (PERCOCET) 5-325 MG tablet  Every 6 hours PRN        05/03/21 0300    docusate sodium (COLACE) 100 MG capsule        05/03/21 0300             Note:  This document was prepared using Dragon voice recognition software and may include unintentional dictation errors.   Hinda Kehr, MD 05/03/21 [phone]

## 2021-05-11 ENCOUNTER — Encounter: Payer: Self-pay | Admitting: Oncology

## 2021-05-11 ENCOUNTER — Inpatient Hospital Stay: Payer: PPO | Attending: Oncology | Admitting: Oncology

## 2021-05-11 ENCOUNTER — Other Ambulatory Visit: Payer: Self-pay

## 2021-05-11 ENCOUNTER — Inpatient Hospital Stay: Payer: PPO

## 2021-05-11 VITALS — BP 125/67 | HR 59 | Temp 98.8°F | Resp 18 | Wt 169.7 lb

## 2021-05-11 DIAGNOSIS — E1159 Type 2 diabetes mellitus with other circulatory complications: Secondary | ICD-10-CM | POA: Diagnosis not present

## 2021-05-11 DIAGNOSIS — I10 Essential (primary) hypertension: Secondary | ICD-10-CM | POA: Diagnosis not present

## 2021-05-11 DIAGNOSIS — Z888 Allergy status to other drugs, medicaments and biological substances status: Secondary | ICD-10-CM | POA: Insufficient documentation

## 2021-05-11 DIAGNOSIS — I700 Atherosclerosis of aorta: Secondary | ICD-10-CM | POA: Diagnosis not present

## 2021-05-11 DIAGNOSIS — Z8249 Family history of ischemic heart disease and other diseases of the circulatory system: Secondary | ICD-10-CM | POA: Insufficient documentation

## 2021-05-11 DIAGNOSIS — R2242 Localized swelling, mass and lump, left lower limb: Secondary | ICD-10-CM | POA: Diagnosis not present

## 2021-05-11 DIAGNOSIS — N400 Enlarged prostate without lower urinary tract symptoms: Secondary | ICD-10-CM | POA: Insufficient documentation

## 2021-05-11 DIAGNOSIS — F419 Anxiety disorder, unspecified: Secondary | ICD-10-CM | POA: Diagnosis not present

## 2021-05-11 DIAGNOSIS — R5383 Other fatigue: Secondary | ICD-10-CM | POA: Insufficient documentation

## 2021-05-11 DIAGNOSIS — D3A023 Benign carcinoid tumor of the transverse colon: Secondary | ICD-10-CM

## 2021-05-11 DIAGNOSIS — Z833 Family history of diabetes mellitus: Secondary | ICD-10-CM | POA: Insufficient documentation

## 2021-05-11 DIAGNOSIS — R220 Localized swelling, mass and lump, head: Secondary | ICD-10-CM | POA: Diagnosis not present

## 2021-05-11 DIAGNOSIS — K449 Diaphragmatic hernia without obstruction or gangrene: Secondary | ICD-10-CM | POA: Insufficient documentation

## 2021-05-11 DIAGNOSIS — Z87442 Personal history of urinary calculi: Secondary | ICD-10-CM | POA: Insufficient documentation

## 2021-05-11 DIAGNOSIS — Z881 Allergy status to other antibiotic agents status: Secondary | ICD-10-CM | POA: Insufficient documentation

## 2021-05-11 DIAGNOSIS — M79652 Pain in left thigh: Secondary | ICD-10-CM | POA: Diagnosis not present

## 2021-05-11 DIAGNOSIS — R222 Localized swelling, mass and lump, trunk: Secondary | ICD-10-CM | POA: Diagnosis not present

## 2021-05-11 DIAGNOSIS — E785 Hyperlipidemia, unspecified: Secondary | ICD-10-CM | POA: Diagnosis not present

## 2021-05-11 DIAGNOSIS — K5730 Diverticulosis of large intestine without perforation or abscess without bleeding: Secondary | ICD-10-CM | POA: Insufficient documentation

## 2021-05-11 DIAGNOSIS — M25552 Pain in left hip: Secondary | ICD-10-CM | POA: Insufficient documentation

## 2021-05-11 DIAGNOSIS — K4090 Unilateral inguinal hernia, without obstruction or gangrene, not specified as recurrent: Secondary | ICD-10-CM | POA: Insufficient documentation

## 2021-05-11 DIAGNOSIS — Z885 Allergy status to narcotic agent status: Secondary | ICD-10-CM | POA: Diagnosis not present

## 2021-05-14 ENCOUNTER — Encounter: Payer: Self-pay | Admitting: Family Medicine

## 2021-05-14 ENCOUNTER — Ambulatory Visit (INDEPENDENT_AMBULATORY_CARE_PROVIDER_SITE_OTHER): Payer: PPO | Admitting: Family Medicine

## 2021-05-14 ENCOUNTER — Other Ambulatory Visit: Payer: Self-pay

## 2021-05-14 VITALS — BP 136/68 | HR 63 | Temp 98.2°F | Wt 168.0 lb

## 2021-05-14 DIAGNOSIS — E1159 Type 2 diabetes mellitus with other circulatory complications: Secondary | ICD-10-CM | POA: Diagnosis not present

## 2021-05-14 DIAGNOSIS — Z23 Encounter for immunization: Secondary | ICD-10-CM

## 2021-05-14 DIAGNOSIS — F419 Anxiety disorder, unspecified: Secondary | ICD-10-CM

## 2021-05-14 DIAGNOSIS — M5432 Sciatica, left side: Secondary | ICD-10-CM | POA: Diagnosis not present

## 2021-05-14 DIAGNOSIS — E1169 Type 2 diabetes mellitus with other specified complication: Secondary | ICD-10-CM | POA: Diagnosis not present

## 2021-05-14 DIAGNOSIS — I152 Hypertension secondary to endocrine disorders: Secondary | ICD-10-CM | POA: Diagnosis not present

## 2021-05-14 DIAGNOSIS — C7A023 Malignant carcinoid tumor of the transverse colon: Secondary | ICD-10-CM | POA: Insufficient documentation

## 2021-05-14 DIAGNOSIS — E785 Hyperlipidemia, unspecified: Secondary | ICD-10-CM | POA: Diagnosis not present

## 2021-05-14 LAB — BAYER DCA HB A1C WAIVED: HB A1C (BAYER DCA - WAIVED): 7.7 % — ABNORMAL HIGH (ref 4.8–5.6)

## 2021-05-14 LAB — MICROALBUMIN, URINE WAIVED
Creatinine, Urine Waived: 200 mg/dL (ref 10–300)
Microalb, Ur Waived: 150 mg/L — ABNORMAL HIGH (ref 0–19)

## 2021-05-14 MED ORDER — LANSOPRAZOLE 30 MG PO CPDR: 30.0000 mg | DELAYED_RELEASE_CAPSULE | Freq: Every day | ORAL | 1 refills | Status: DC

## 2021-05-14 MED ORDER — ATENOLOL 25 MG PO TABS: 25.0000 mg | ORAL_TABLET | Freq: Two times a day (BID) | ORAL | 1 refills | Status: DC

## 2021-05-14 MED ORDER — NAPROXEN 500 MG PO TABS: 500.0000 mg | ORAL_TABLET | Freq: Two times a day (BID) | ORAL | 3 refills | Status: DC

## 2021-05-14 MED ORDER — METFORMIN HCL 500 MG PO TABS: 1000.0000 mg | ORAL_TABLET | Freq: Two times a day (BID) | ORAL | 1 refills | Status: DC

## 2021-05-14 MED ORDER — HYDROCHLOROTHIAZIDE 25 MG PO TABS: 25.0000 mg | ORAL_TABLET | Freq: Every day | ORAL | 1 refills | Status: DC

## 2021-05-14 MED ORDER — BACLOFEN 10 MG PO TABS: 10.0000 mg | ORAL_TABLET | Freq: Every evening | ORAL | 3 refills | Status: DC | PRN

## 2021-05-14 MED ORDER — BENAZEPRIL HCL 40 MG PO TABS: 40.0000 mg | ORAL_TABLET | Freq: Every day | ORAL | 1 refills | Status: DC

## 2021-05-14 MED ORDER — ATORVASTATIN CALCIUM 20 MG PO TABS: 20.0000 mg | ORAL_TABLET | Freq: Every day | ORAL | 1 refills | Status: DC

## 2021-05-14 NOTE — Progress Notes (Signed)
Met with Mr. Stolar and his spouse. Introduced Therapist, nutritional and provided contact information for future needs. We will call him with PET appointment details. He prefers to have a very late day time and does not want to be called until very late in the day. He requested pain medication for his hip. He states he has seven pills left from the ED but it makes him constipated and he does not want to take it. He has an appointment on Monday with his PCP and he was encouraged to address his hip and leg discomfort with his PCP.

## 2021-05-14 NOTE — Assessment & Plan Note (Signed)
Doing well with A1c of 7.7. Continue current regimen. Continue to monitor. Call with any concerns.

## 2021-05-14 NOTE — Assessment & Plan Note (Signed)
Doing well continuing to wean down. Only taking 1/2 tab at night. Will continue taking 1/2 tab at night for about a week, then can stop. Still has refills. Continue to monitor. Will not give any refills at this time.

## 2021-05-14 NOTE — Assessment & Plan Note (Signed)
Under good control on current regimen. Continue current regimen. Continue to monitor. Call with any concerns. Refills given. Labs drawn today.   

## 2021-05-14 NOTE — Progress Notes (Signed)
BP 136/68   Pulse 63   Temp 98.2 F (36.8 C)   Wt 168 lb (76.2 kg)   SpO2 99%   BMI 26.31 kg/m    Subjective:    Patient ID: Russell Byrd, male    DOB: 06/16/1946, 75 y.o.   MRN: [id number]  HPI: Russell Byrd is a 75 y.o. male  Chief Complaint  Patient presents with   Hypertension   Diabetes   Anxiety   Went to the ER on 10/27. Has a mass in his colon. He is seeing oncology. He is to have a PET scan. Not set diagnosis yet, but Dr. Janese Banks told him that it is growing very slowly.    DIABETES Hypoglycemic episodes:no Polydipsia/polyuria: no Visual disturbance: no Chest pain: no Paresthesias: no Glucose Monitoring: no  Accucheck frequency: Not Checking Taking Insulin?: no Blood Pressure Monitoring: not checking Retinal Examination: Not up to Date Foot Exam: Up to Date Diabetic Education: Completed Pneumovax: Up to Date Influenza: Up to Date Aspirin: no  HYPERTENSION / [place] Satisfied with current treatment? yes Duration of hypertension: chronic BP monitoring frequency: not checking BP medication side effects: no Past BP meds: atenolol, benazepril, HCTZ Duration of hyperlipidemia: chronic Cholesterol medication side effects: no Cholesterol supplements: none Past cholesterol medications: atorvastatin Medication compliance: excellent compliance Aspirin: no Recent stressors: no Recurrent headaches: no Visual changes: no Palpitations: no Dyspnea: no Chest pain: no Lower extremity edema: no Dizzy/lightheaded: no  ANXIETY/STRESS- has been cutting himself down on his clorazepate, has only been taking 1/2 tab at night for the past 2 weeks Duration:stable Anxious mood: no  Excessive worrying: no Irritability: no  Sweating: no Nausea: no Palpitations:no Hyperventilation: no Panic attacks: no Agoraphobia: no  Obscessions/compulsions: no Depressed mood: no Depression screen [facility] 2/9 11/13/2020 10/23/2020 07/18/2020 04/17/2020 01/17/2020  Decreased  Interest 0 0 0 0 0  Down, Depressed, Hopeless 0 0 0 0 0  PHQ - 2 Score 0 0 0 0 0  Altered sleeping 0 0 0 - 0  Tired, decreased energy 0 0 0 - 0  Change in appetite 0 0 0 - 0  Feeling bad or failure about yourself  0 0 0 - 0  Trouble concentrating 0 0 0 - 0  Moving slowly or fidgety/restless 0 0 0 - 0  Suicidal thoughts 0 0 0 - -  PHQ-9 Score 0 0 0 - 0  Difficult doing work/chores - - Not difficult at all - -   Anhedonia: no Weight changes: no Insomnia: no   Hypersomnia: no Fatigue/loss of energy: no Feelings of worthlessness: no Feelings of guilt: no Impaired concentration/indecisiveness: no Suicidal ideations: no  Crying spells: no Recent Stressors/Life Changes: no   Relationship problems: no   Family stress: no     Financial stress: no    Job stress: no    Recent death/loss: no  BACK PAIN Duration:  few weeks Mechanism of injury: unknown Location: Left and low back Onset: sudden Severity: moderate Quality: aching and shooting Frequency: constant Radiation: L leg above the knee Aggravating factors: lifting and moving Alleviating factors: nothing Status: stable Treatments attempted: pain meds  Relief with NSAIDs?: No NSAIDs Taken Nighttime pain:  no Paresthesias / decreased sensation:  no Bowel / bladder incontinence:  no Fevers:  no Dysuria / urinary frequency:  no  Relevant past medical, surgical, family and social history reviewed and updated as indicated. Interim medical history since our last visit reviewed. Allergies and medications reviewed and updated.  Review of  Systems  Constitutional: Negative.   Respiratory: Negative.    Cardiovascular: Negative.   Gastrointestinal: Negative.   Musculoskeletal:  Positive for arthralgias, back pain and myalgias. Negative for gait problem, joint swelling, neck pain and neck stiffness.  Skin: Negative.   Neurological: Negative.   Psychiatric/Behavioral: Negative.     Per HPI unless specifically indicated  above     Objective:    BP 136/68   Pulse 63   Temp 98.2 F (36.8 C)   Wt 168 lb (76.2 kg)   SpO2 99%   BMI 26.31 kg/m   Wt Readings from Last 3 Encounters:  05/14/21 168 lb (76.2 kg)  05/11/21 169 lb 11.2 oz (77 kg)  05/02/21 170 lb (77.1 kg)    Physical Exam Vitals and nursing note reviewed.  Constitutional:      General: He is not in acute distress.    Appearance: Normal appearance. He is not ill-appearing, toxic-appearing or diaphoretic.  HENT:     Head: Normocephalic and atraumatic.     Right Ear: External ear normal.     Left Ear: External ear normal.     Nose: Nose normal.     Mouth/Throat:     Mouth: Mucous membranes are moist.     Pharynx: Oropharynx is clear.  Eyes:     General: No scleral icterus.       Right eye: No discharge.        Left eye: No discharge.     Extraocular Movements: Extraocular movements intact.     Conjunctiva/sclera: Conjunctivae normal.     Pupils: Pupils are equal, round, and reactive to light.  Cardiovascular:     Rate and Rhythm: Normal rate and regular rhythm.     Pulses: Normal pulses.     Heart sounds: Normal heart sounds. No murmur heard.   No friction rub. No gallop.  Pulmonary:     Effort: Pulmonary effort is normal. No respiratory distress.     Breath sounds: Normal breath sounds. No stridor. No wheezing, rhonchi or rales.  Chest:     Chest wall: No tenderness.  Musculoskeletal:        General: Normal range of motion.     Cervical back: Normal range of motion and neck supple.  Skin:    General: Skin is warm and dry.     Capillary Refill: Capillary refill takes less than 2 seconds.     Coloration: Skin is not jaundiced or pale.     Findings: No bruising, erythema, lesion or rash.  Neurological:     General: No focal deficit present.     Mental Status: He is alert and oriented to person, place, and time. Mental status is at baseline.  Psychiatric:        Mood and Affect: Mood normal.        Behavior: Behavior  normal.        Thought Content: Thought content normal.        Judgment: Judgment normal.    Results for orders placed or performed in visit on 05/14/21  Bayer DCA Hb A1c Waived  Result Value Ref Range   HB A1C (BAYER DCA - WAIVED) 7.7 (H) 4.8 - 5.6 %  Microalbumin, Urine Waived  Result Value Ref Range   Microalb, Ur Waived 150 (H) 0 - 19 mg/L   Creatinine, Urine Waived 200 10 - 300 mg/dL   Microalb/Creat Ratio 30-300 (H) <30 mg/g      Assessment & Plan:   Problem List  Items Addressed This Visit       Cardiovascular and Mediastinum   Hypertension associated with diabetes ([place])    Under good control on current regimen. Continue current regimen. Continue to monitor. Call with any concerns. Refills given. Labs drawn today.       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 Following with oncology. Continue to follow with them. Await PET results. Call with any concerns.         Endocrine   Diabetes mellitus associated with hormonal etiology ([place])    Doing well with A1c of 7.7. Continue current regimen. Continue to monitor. Call with any concerns.       Relevant Medications   atorvastatin (LIPITOR) 20 MG tablet   benazepril (LOTENSIN) 40 MG tablet   metFORMIN (GLUCOPHAGE) 500 MG tablet   Other Relevant Orders   Bayer DCA Hb A1c Waived (Completed)   Comprehensive metabolic panel   CBC with Differential/Platelet   Microalbumin, Urine Waived (Completed)     Other   Hyperlipidemia    Under good control on current regimen. Continue current regimen. Continue to monitor. Call with  any concerns. Refills given. Labs drawn today.       Relevant Medications   atenolol (TENORMIN) 25 MG tablet   atorvastatin (LIPITOR) 20 MG tablet   benazepril (LOTENSIN) 40 MG tablet   hydrochlorothiazide (HYDRODIURIL) 25 MG tablet   Other Relevant Orders   Comprehensive metabolic panel   CBC with Differential/Platelet   Lipid Panel w/o Chol/HDL Ratio   Chronic anxiety    Doing well continuing to wean down. Only taking 1/2 tab at night. Will continue taking 1/2 tab at night for about a week, then can stop. Still has refills. Continue to monitor. Will not give any refills at this time.       Relevant Orders   Comprehensive metabolic panel   CBC with Differential/Platelet   Other Visit Diagnoses     Sciatica, left side    -  Primary   Will treat with baclofen, naproxen and stretches. Continue to monitor. Call with any concerns or not getting better.    Relevant Medications   baclofen (LIORESAL) 10 MG tablet   Need for influenza vaccination       Flu shot given today.   Relevant Orders   Flu Vaccine QUAD High Dose(Fluad) (Completed)        Follow up plan: Return in about 6 months (around 11/11/2021), or physical.

## 2021-05-14 NOTE — Assessment & Plan Note (Signed)
Newly diagnosed. Following with oncology. Continue to follow with them. Await PET results. Call with any concerns.

## 2021-05-15 ENCOUNTER — Ambulatory Visit
Admission: RE | Admit: 2021-05-15 | Discharge: 2021-05-15 | Disposition: A | Discharge status: Home / Self Care | Payer: Self-pay | Source: Ambulatory Visit | Attending: Oncology | Admitting: Oncology

## 2021-05-15 ENCOUNTER — Other Ambulatory Visit: Payer: Self-pay

## 2021-05-15 DIAGNOSIS — D3A023 Benign carcinoid tumor of the transverse colon: Secondary | ICD-10-CM

## 2021-05-15 LAB — CBC WITH DIFFERENTIAL/PLATELET
Basophils Absolute: 0.1 10*3/uL (ref 0.0–0.2)
Basos: 1 %
EOS (ABSOLUTE): 0.1 10*3/uL (ref 0.0–0.4)
Eos: 1 %
Hematocrit: 38.1 % (ref 37.5–51.0)
Hemoglobin: 13.3 g/dL (ref 13.0–17.7)
Immature Grans (Abs): 0.1 10*3/uL (ref 0.0–0.1)
Immature Granulocytes: 1 %
Lymphocytes Absolute: 1.9 10*3/uL (ref 0.7–3.1)
Lymphs: 18 %
MCH: 30.7 pg (ref 26.6–33.0)
MCHC: 34.9 g/dL (ref 31.5–35.7)
MCV: 88 fL (ref 79–97)
Monocytes Absolute: 0.7 10*3/uL (ref 0.1–0.9)
Monocytes: 7 %
Neutrophils Absolute: 7.5 10*3/uL — ABNORMAL HIGH (ref 1.4–7.0)
Neutrophils: 72 %
Platelets: 246 10*3/uL (ref 150–450)
RBC: 4.33 x10E6/uL (ref 4.14–5.80)
RDW: 11.8 % (ref 11.6–15.4)
WBC: 10.3 10*3/uL (ref 3.4–10.8)

## 2021-05-15 LAB — COMPREHENSIVE METABOLIC PANEL
ALT: 14 IU/L (ref 0–44)
AST: 12 IU/L (ref 0–40)
Albumin/Globulin Ratio: 1.8 (ref 1.2–2.2)
Albumin: 4.8 g/dL — ABNORMAL HIGH (ref 3.7–4.7)
Alkaline Phosphatase: 95 IU/L (ref 44–121)
BUN/Creatinine Ratio: 16 (ref 10–24)
BUN: 19 mg/dL (ref 8–27)
Bilirubin Total: 0.4 mg/dL (ref 0.0–1.2)
CO2: 21 mmol/L (ref 20–29)
Calcium: 9.8 mg/dL (ref 8.6–10.2)
Chloride: 103 mmol/L (ref 96–106)
Creatinine, Ser: 1.19 mg/dL (ref 0.76–1.27)
Globulin, Total: 2.6 g/dL (ref 1.5–4.5)
Glucose: 180 mg/dL — ABNORMAL HIGH (ref 70–99)
Potassium: 5 mmol/L (ref 3.5–5.2)
Sodium: 138 mmol/L (ref 134–144)
Total Protein: 7.4 g/dL (ref 6.0–8.5)
eGFR: 64 mL/min/{1.73_m2} (ref >59)

## 2021-05-15 LAB — LIPID PANEL W/O CHOL/HDL RATIO
Cholesterol, Total: 125 mg/dL (ref 100–199)
HDL: 34 mg/dL — ABNORMAL LOW (ref >39)
LDL Chol Calc (NIH): 66 mg/dL (ref 0–99)
Triglycerides: 142 mg/dL (ref 0–149)
VLDL Cholesterol Cal: 25 mg/dL (ref 5–40)

## 2021-05-15 LAB — CHROMOGRANIN A: Chromogranin A (ng/mL): 469.7 ng/mL — ABNORMAL HIGH (ref 0.0–101.8)

## 2021-05-16 ENCOUNTER — Encounter: Payer: Self-pay | Admitting: Family Medicine

## 2021-05-17 ENCOUNTER — Telehealth: Payer: Self-pay | Admitting: Family Medicine

## 2021-05-17 NOTE — Progress Notes (Signed)
Hematology/Oncology Consult note [facility] Telephone:([phone] [phone]  Patient Care Team: Valerie Roys, DO as PCP - General (Family Medicine) Clent Jacks, RN as Oncology Nurse Navigator   Name of the patient: Russell Byrd  [id number]  Nov 17, 1945    Reason for referral-mesenteric mass   Referring physician-Dr. Karma Greaser  Date of visit: 05/17/21   History of presenting illness- Patient is a 75 year old male with a past medical history significant for hypertension hyperlipidemia and type 2 diabetes who presented to the ER on 05/03/2021 with symptoms of flank pain which prompted a CT scan of the abdomen.  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  Patient referred for further management.  He reports that his left flank pain has moved somewhat but he still has pain in his left groin that radiates to his left thigh which makes it difficult for him to walk  With regards to mesenteric mass patient has had prior abdominal imagingAt [facility] in 2015 as well as 2018.  Even in 2015 patient was found to have 4 x 3.4 cm partially calcified soft tissue mass tenting the mid transverse colon and in 2018 this mass was unchanged at around 4 cm as well.  ECOG PS- 1  Pain scale- 4   Review of systems- Review of Systems  Constitutional:  Positive for malaise/fatigue. Negative for chills, fever and weight loss.  HENT:  Negative for congestion, ear discharge and nosebleeds.   Eyes:  Negative for blurred vision.  Respiratory:  Negative for cough, hemoptysis, sputum production, shortness of breath and wheezing.   Cardiovascular:  Negative for chest pain, palpitations, orthopnea and claudication.  Gastrointestinal:  Negative for abdominal pain, blood in stool, constipation, diarrhea, heartburn, melena, nausea and vomiting.  Genitourinary:  Negative for dysuria,  flank pain, frequency, hematuria and urgency.  Musculoskeletal:  Negative for back pain, joint pain and myalgias.       Left hip pain  Skin:  Negative for rash.  Neurological:  Negative for dizziness, tingling, focal weakness, seizures, weakness and headaches.  Endo/Heme/Allergies:  Does not bruise/bleed easily.  Psychiatric/Behavioral:  Negative for depression and suicidal ideas. The patient does not have insomnia.    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   Bruising 03/22/2021   Strain of right biceps muscle 03/22/2021   Controlled substance agreement signed 11/13/2020   Advanced care planning/counseling discussion 10/13/2017   Hypertension associated with diabetes ([place]) 07/15/2017   Hyperlipidemia 07/15/2017   Diabetes mellitus associated with hormonal etiology ([place]) 07/15/2017   BPH (benign prostatic hyperplasia) 10/07/2016   Primary osteoarthritis of right knee 04/04/2016   Chronic anxiety 04/03/2015     Past Medical History:  Diagnosis Date   Diabetes mellitus without complication ([place])    History of kidney stones    Hyperlipidemia    Hypertension      Past Surgical History:  Procedure Laterality Date   KNEE SURGERY Bilateral    arthroscopic    Social History   Socioeconomic History   Marital status: Married    Spouse name: Not on  file   Number of children: Not on file   Years of education: Not on file   Highest education level: Not on file  Occupational History   Not on file  Tobacco Use   Smoking status: Never   Smokeless tobacco: Current    Types: Chew  Vaping Use   Vaping Use: Never used  Substance and Sexual Activity   Alcohol use: Not Currently   Drug use: No    Sexual activity: Not Currently  Other Topics Concern   Not on file  Social History Narrative   Not on file   Social Determinants of Health   Financial Resource Strain: Not on file  Food Insecurity: Not on file  Transportation Needs: Not on file  Physical Activity: Not on file  Stress: Not on file  Social Connections: Not on file  Intimate Partner Violence: Not on file     Family History  Problem Relation Age of Onset   Diabetes Father    Heart disease Father    Diabetes Mother      Current Outpatient Medications:    Acetaminophen (TYLENOL 8 HOUR PO), Take by mouth daily., Disp: , Rfl:    blood glucose meter kit and supplies KIT, Dispense based on patient and insurance preference; One touch. Use up to three times daily as directed. (FOR ICD-9 250.00, 250.01)., Disp: 1 each, Rfl: 12   clorazepate (TRANXENE) 3.75 MG tablet, Take 1 tablet (3.75 mg total) by mouth 2 (two) times daily., Disp: 60 tablet, Rfl: 2   docusate sodium (COLACE) 100 MG capsule, Take 1 tablet once or twice daily as needed for constipation while taking narcotic pain medicine, Disp: 30 capsule, Rfl: 0   Ibuprofen 200 MG CAPS, Take by mouth., Disp: , Rfl:    Misc Natural Products (OSTEO BI-FLEX/5-LOXIN ADVANCED PO), Take by mouth., Disp: , Rfl:    Multiple Vitamins-Minerals (CENTRUM SILVER ADULT 50+) TABS, Take by mouth daily., Disp: , Rfl:    OneTouch Delica Lancets 35T MISC, Apply topically., Disp: , Rfl:    ONETOUCH ULTRA test strip, 1 each 2 (two) times daily., Disp: , Rfl:    vitamin E 400 UNIT capsule, Take 400 Units by mouth daily., Disp: , Rfl:    Zinc 50 MG CAPS, Take 50 mg by mouth daily., Disp: , Rfl:    atenolol (TENORMIN) 25 MG tablet, Take 1 tablet (25 mg total) by mouth 2 (two) times daily., Disp: 180 tablet, Rfl: 1   atorvastatin (LIPITOR) 20 MG tablet, Take 1 tablet (20 mg total) by mouth daily., Disp: 90 tablet, Rfl: 1   baclofen (LIORESAL) 10 MG tablet, Take 1 tablet (10 mg total) by mouth at  bedtime as needed for muscle spasms., Disp: 30 each, Rfl: 3   benazepril (LOTENSIN) 40 MG tablet, Take 1 tablet (40 mg total) by mouth daily., Disp: 90 tablet, Rfl: 1   hydrochlorothiazide (HYDRODIURIL) 25 MG tablet, Take 1 tablet (25 mg total) by mouth daily., Disp: 90 tablet, Rfl: 1   lansoprazole (PREVACID) 30 MG capsule, Take 1 capsule (30 mg total) by mouth daily at 12 noon., Disp: 90 capsule, Rfl: 1   metFORMIN (GLUCOPHAGE) 500 MG tablet, Take 2 tablets (1,000 mg total) by mouth 2 (two) times daily with a meal., Disp: 360 tablet, Rfl: 1   naproxen (NAPROSYN) 500 MG tablet, Take 1 tablet (500 mg total) by mouth 2 (two) times daily with a meal., Disp: 60 tablet, Rfl: 3   oxyCODONE-acetaminophen (PERCOCET) 5-325 MG tablet, Take 2  tablets by mouth every 6 (six) hours as needed for severe pain., Disp: 16 tablet, Rfl: 0   Physical exam:  Vitals:   05/11/21 1508  BP: 125/67  Pulse: (!) 59  Resp: 18  Temp: 98.8 F (37.1 C)  SpO2: 100%  Weight: 169 lb 11.2 oz (77 kg)   Physical Exam Constitutional:      General: He is not in acute distress. Cardiovascular:     Rate and Rhythm: Normal rate and regular rhythm.     Heart sounds: Normal heart sounds.  Pulmonary:     Effort: Pulmonary effort is normal.     Breath sounds: Normal breath sounds.  Abdominal:     General: Bowel sounds are normal.     Palpations: Abdomen is soft.  Skin:    General: Skin is warm and dry.  Neurological:     Mental Status: He is alert and oriented to person, place, and time.       CMP Latest Ref Rng & Units 05/14/2021  Glucose 70 - 99 mg/dL 180(H)  BUN 8 - 27 mg/dL 19  Creatinine 0.76 - 1.27 mg/dL 1.19  Sodium 134 - 144 mmol/L 138  Potassium 3.5 - 5.2 mmol/L 5.0  Chloride 96 - 106 mmol/L 103  CO2 20 - 29 mmol/L 21  Calcium 8.6 - 10.2 mg/dL 9.8  Total Protein 6.0 - 8.5 g/dL 7.4  Total Bilirubin 0.0 - 1.2 mg/dL 0.4  Alkaline Phos 44 - 121 IU/L 95  AST 0 - 40 IU/L 12  ALT 0 - 44 IU/L 14   CBC Latest  Ref Rng & Units 05/14/2021  WBC 3.4 - 10.8 x10E3/uL 10.3  Hemoglobin 13.0 - 17.7 g/dL 13.3  Hematocrit 37.5 - 51.0 % 38.1  Platelets 150 - 450 x10E3/uL 246    No images are attached to the encounter.  CT Renal Stone Study  Result Date: 05/02/2021 CLINICAL DATA:  Flank pain, kidney stone suspected. L groin. Pt has hx of kidney stones, feels similar. EXAM: CT ABDOMEN AND PELVIS WITHOUT CONTRAST TECHNIQUE: Multidetector CT imaging of the abdomen and pelvis was performed following the standard protocol without IV contrast. COMPARISON:  CT stone 10/02/1998 FINDINGS: Lower chest: Coronary artery calcifications.  Tiny hiatal hernia. Hepatobiliary: Punctate calcifications likely sequela prior granulomatous disease. No focal liver abnormality. No gallstones, gallbladder wall thickening, or pericholecystic fluid. No biliary dilatation. Pancreas: No focal lesion. Normal pancreatic contour. No surrounding inflammatory changes. No main pancreatic ductal dilatation. Spleen: Normal in size without focal abnormality. Adrenals/Urinary Tract: No right adrenal gland nodule. There is a slightly increased in size 1.5 cm left adrenal gland nodule with a density of 10 Hounsfield units likely representing an adenoma. Nonspecific bilateral perinephric stranding. No nephrolithiasis and no hydronephrosis. No definite contour-deforming renal mass. No ureterolithiasis or hydroureter. The urinary bladder is unremarkable. Stomach/Bowel: Stomach is within normal limits. No evidence of bowel wall thickening or dilatation. Scattered colonic diverticulosis. Appendix appears normal. Vascular/Lymphatic: No abdominal aorta or iliac aneurysm. Severe atherosclerotic plaque of the aorta and its branches. No abdominal, pelvic, or inguinal lymphadenopathy. Reproductive: Prostate is unremarkable. Other: Coarsely calcified at head of the ring mesenteric mass measuring 5 x 4.5 cm centered within the upper anterior abdomen appearing inseparable from  the posterior wall of the mid transverse colon. No intraperitoneal free fluid. No intraperitoneal free gas. No organized fluid collection. Musculoskeletal: Small volume right inguinal hernia containing fat. Right the testis is noted within the inguinal canal. No suspicious lytic or blastic osseous lesions. No acute  displaced fracture. Multilevel degenerative changes of the spine. IMPRESSION: 1. A 5 x 4.5 cm calcified mesenteric mass that appears in several from the transverse colon suggestive of carcinoid tumor. 2. Right testis within the right inguinal canal. Please correlate with retractile versus less likely undescended testis. 3. Small volume fat containing right inguinal hernia. 4. Tiny hiatal hernia. 5. Scattered colonic diverticulosis with no acute diverticulitis. 6.  Aortic Atherosclerosis (ICD10-I70.0). Electronically Signed   By: Iven Finn M.D.   On: 05/02/2021 21:50    Assessment and plan- Patient is a 75 y.o. male referred from the ER for incidentally found mesenteric mass  Patient appears to have had this mesenteric mass at least dating back to 2015 based on prior CT scans and at [facility] when the mass was about 4 cm.  Presently the mass measures 5 cm and is hard to say if there has been a definite growth but we will obtain outside images for comparison.  I will also obtain a dotatate PET scan and if the mass is significantly hypermetabolic on the study-it would be highly suggestive of carcinoid tumor.  It would be reasonable to observe this tumor since it has not grown over the last 7 years considerably.  I would also consider surgical referral since the typical management of a carcinoid tumor would be resection with lymph node sampling.  I will discuss this with him after PET scan results are back.  With regards to patient's symptoms of left hip pain radiating to his left thigh this appears to be unrelated to the mass since the mass has been present for the last 7 years.  I have asked him to  get in touch with his PCP for further evaluation of this pain   Thank you for this kind referral and the opportunity to participate in the care of this  Patient   Visit Diagnosis 1. Carcinoid tumor of transverse colon, unspecified whether malignant     Dr. Randa Evens, MD, MPH [facility] at [facility] [phone] 05/17/2021

## 2021-05-17 NOTE — Telephone Encounter (Signed)
Lab result letter from Dr. Wynetta Emery printed and mailed to the patient.

## 2021-05-17 NOTE — Telephone Encounter (Signed)
Copied from [place] [id number]. Topic: General - Other >> May 17, 2021  4:19 PM Erick Blinks wrote: Reason for CRM: Pt is requesting for the office to mail his "blood work with full details" to his home address. (From appt 05/14/2021)

## 2021-05-18 ENCOUNTER — Other Ambulatory Visit: Payer: Self-pay | Admitting: *Deleted

## 2021-05-18 ENCOUNTER — Ambulatory Visit
Admission: RE | Admit: 2021-05-18 | Discharge: 2021-05-18 | Disposition: A | Discharge status: Home / Self Care | Payer: Self-pay | Source: Ambulatory Visit | Attending: *Deleted | Admitting: *Deleted

## 2021-05-18 DIAGNOSIS — C801 Malignant (primary) neoplasm, unspecified: Secondary | ICD-10-CM

## 2021-05-21 ENCOUNTER — Ambulatory Visit
Admission: RE | Admit: 2021-05-21 | Discharge: 2021-05-21 | Disposition: A | Discharge status: Home / Self Care | Payer: PPO | Source: Ambulatory Visit | Attending: Oncology | Admitting: Oncology

## 2021-05-21 ENCOUNTER — Other Ambulatory Visit: Payer: Self-pay

## 2021-05-21 DIAGNOSIS — Z86012 Personal history of benign carcinoid tumor: Secondary | ICD-10-CM | POA: Diagnosis not present

## 2021-05-21 DIAGNOSIS — K6389 Other specified diseases of intestine: Secondary | ICD-10-CM | POA: Diagnosis not present

## 2021-05-21 DIAGNOSIS — C7A029 Malignant carcinoid tumor of the large intestine, unspecified portion: Secondary | ICD-10-CM | POA: Diagnosis not present

## 2021-05-21 DIAGNOSIS — D3A023 Benign carcinoid tumor of the transverse colon: Secondary | ICD-10-CM | POA: Diagnosis not present

## 2021-05-21 DIAGNOSIS — K639 Disease of intestine, unspecified: Secondary | ICD-10-CM | POA: Insufficient documentation

## 2021-05-21 DIAGNOSIS — N281 Cyst of kidney, acquired: Secondary | ICD-10-CM | POA: Diagnosis not present

## 2021-05-21 MED ORDER — GALLIUM GA 68 DOTATATE IV KIT
4.1100 | PACK | Freq: Once | INTRAVENOUS | Status: AC | PRN
  Administered 2021-05-21: 3.8 via INTRAVENOUS

## 2021-05-22 ENCOUNTER — Ambulatory Visit: Payer: PPO

## 2021-05-23 ENCOUNTER — Telehealth: Payer: Self-pay | Admitting: *Deleted

## 2021-05-23 NOTE — Telephone Encounter (Signed)
Patient called.

## 2021-05-23 NOTE — Telephone Encounter (Signed)
Patient would like Mariea Clonts RN to please give him a call.

## 2021-05-28 ENCOUNTER — Ambulatory Visit: Payer: PPO | Admitting: Oncology

## 2021-05-28 ENCOUNTER — Ambulatory Visit: Payer: PPO

## 2021-06-07 ENCOUNTER — Inpatient Hospital Stay: Payer: PPO | Admitting: Oncology

## 2021-06-11 ENCOUNTER — Encounter: Payer: Self-pay | Admitting: Oncology

## 2021-06-11 ENCOUNTER — Inpatient Hospital Stay: Payer: PPO | Attending: Oncology | Admitting: Oncology

## 2021-06-11 ENCOUNTER — Telehealth: Payer: Self-pay | Admitting: Oncology

## 2021-06-11 DIAGNOSIS — K668 Other specified disorders of peritoneum: Secondary | ICD-10-CM

## 2021-06-11 NOTE — Progress Notes (Signed)
I connected with Russell Byrd on 06/11/21 at  1:45 PM EST by telephone visit and verified that I am speaking with the correct person using two identifiers.   I discussed the limitations, risks, security and privacy concerns of performing an evaluation and management service by telemedicine and the availability of in-person appointments. I also discussed with the patient that there may be a patient responsible charge related to this service. The patient expressed understanding and agreed to proceed.  Other persons participating in the visit and their role in the encounter:  none  Patient's location:  home Provider's location:  work  Risk analyst Complaint: Discuss dotatate PET scan results and further management  History of present illness: Patient is a 75 year old male with a past medical history significant for hypertension hyperlipidemia and type 2 diabetes who presented to the ER on 05/03/2021 with symptoms of flank pain which prompted a CT scan of the abdomen.  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  Patient referred for further management.  He reports that his left flank pain has moved somewhat but he still has pain in his left groin that radiates to his left thigh which makes it difficult for him to walk   With regards to mesenteric mass patient has had prior abdominal imagingAt [facility] in 2015 as well as 2018.  Even in 2015 patient was found to have 4 x 3.4 cm partially calcified soft tissue mass tenting the mid transverse colon and in 2018 this mass was unchanged at around 4 cm as well.  Dotatate PET scan showed large calcified mass in the transverse colon mesentery without radiotracer activity findings not consistent with neuroendocrine tumor.  No other evidence of active well-differentiated neuroendocrine tumor  Interval history patient feels well you have been doing well overall and denies any  specific complaints at this time   Review of Systems  Constitutional:  Negative for chills, fever, malaise/fatigue and weight loss.  HENT:  Negative for congestion, ear discharge and nosebleeds.   Eyes:  Negative for blurred vision.  Respiratory:  Negative for cough, hemoptysis, sputum production, shortness of breath and wheezing.   Cardiovascular:  Negative for chest pain, palpitations, orthopnea and claudication.  Gastrointestinal:  Negative for abdominal pain, blood in stool, constipation, diarrhea, heartburn, melena, nausea and vomiting.  Genitourinary:  Negative for dysuria, flank pain, frequency, hematuria and urgency.  Musculoskeletal:  Negative for back pain, joint pain and myalgias.  Skin:  Negative for rash.  Neurological:  Negative for dizziness, tingling, focal weakness, seizures, weakness and headaches.  Endo/Heme/Allergies:  Does not bruise/bleed easily.  Psychiatric/Behavioral:  Negative for depression and suicidal ideas. The patient does not have insomnia.    Allergies  Allergen Reactions   Prednisone Other (See Comments)    steroids   Tetracycline     Other reaction(s): Unknown   Tetracyclines & Related    Tramadol Other (See Comments)    Keeps him awake    Past Medical History:  Diagnosis Date   Diabetes mellitus without complication ([place])    History of kidney stones    Hyperlipidemia    Hypertension     Past Surgical History:  Procedure Laterality Date   KNEE SURGERY Bilateral    arthroscopic    Social History   Socioeconomic History   Marital status: Married    Spouse name: Not on file   Number of children: Not on file  Years of education: Not on file   Highest education level: Not on file  Occupational History   Not on file  Tobacco Use   Smoking status: Never   Smokeless tobacco: Current    Types: Chew  Vaping Use   Vaping Use: Never used  Substance and Sexual Activity   Alcohol use: Not Currently   Drug use: No   Sexual activity:  Not Currently  Other Topics Concern   Not on file  Social History Narrative   Not on file   Social Determinants of Health   Financial Resource Strain: Not on file  Food Insecurity: Not on file  Transportation Needs: Not on file  Physical Activity: Not on file  Stress: Not on file  Social Connections: Not on file  Intimate Partner Violence: Not on file    Family History  Problem Relation Age of Onset   Diabetes Father    Heart disease Father    Diabetes Mother      Current Outpatient Medications:    Acetaminophen (TYLENOL 8 HOUR PO), Take by mouth daily., Disp: , Rfl:    atenolol (TENORMIN) 25 MG tablet, Take 1 tablet (25 mg total) by mouth 2 (two) times daily., Disp: 180 tablet, Rfl: 1   atorvastatin (LIPITOR) 20 MG tablet, Take 1 tablet (20 mg total) by mouth daily., Disp: 90 tablet, Rfl: 1   baclofen (LIORESAL) 10 MG tablet, Take 1 tablet (10 mg total) by mouth at bedtime as needed for muscle spasms., Disp: 30 each, Rfl: 3   benazepril (LOTENSIN) 40 MG tablet, Take 1 tablet (40 mg total) by mouth daily., Disp: 90 tablet, Rfl: 1   blood glucose meter kit and supplies KIT, Dispense based on patient and insurance preference; One touch. Use up to three times daily as directed. (FOR ICD-9 250.00, 250.01)., Disp: 1 each, Rfl: 12   clorazepate (TRANXENE) 3.75 MG tablet, Take 1 tablet (3.75 mg total) by mouth 2 (two) times daily., Disp: 60 tablet, Rfl: 2   docusate sodium (COLACE) 100 MG capsule, Take 1 tablet once or twice daily as needed for constipation while taking narcotic pain medicine, Disp: 30 capsule, Rfl: 0   hydrochlorothiazide (HYDRODIURIL) 25 MG tablet, Take 1 tablet (25 mg total) by mouth daily., Disp: 90 tablet, Rfl: 1   Ibuprofen 200 MG CAPS, Take by mouth., Disp: , Rfl:    lansoprazole (PREVACID) 30 MG capsule, Take 1 capsule (30 mg total) by mouth daily at 12 noon., Disp: 90 capsule, Rfl: 1   metFORMIN (GLUCOPHAGE) 500 MG tablet, Take 2 tablets (1,000 mg total) by  mouth 2 (two) times daily with a meal., Disp: 360 tablet, Rfl: 1   Misc Natural Products (OSTEO BI-FLEX/5-LOXIN ADVANCED PO), Take by mouth., Disp: , Rfl:    Multiple Vitamins-Minerals (CENTRUM SILVER ADULT 50+) TABS, Take by mouth daily., Disp: , Rfl:    OneTouch Delica Lancets 21J MISC, Apply topically., Disp: , Rfl:    ONETOUCH ULTRA test strip, 1 each 2 (two) times daily., Disp: , Rfl:    oxyCODONE-acetaminophen (PERCOCET) 5-325 MG tablet, Take 2 tablets by mouth every 6 (six) hours as needed for severe pain., Disp: 16 tablet, Rfl: 0   vitamin E 400 UNIT capsule, Take 400 Units by mouth daily., Disp: , Rfl:    Zinc 50 MG CAPS, Take 50 mg by mouth daily., Disp: , Rfl:    naproxen (NAPROSYN) 500 MG tablet, Take 1 tablet (500 mg total) by mouth 2 (two) times daily with a meal. (  Patient not taking: Reported on 06/11/2021), Disp: 60 tablet, Rfl: 3  NM PET (NETSPOT GA 31 DOTATATE) SKULL BASE TO MID THIGH  Result Date: 05/21/2021 CLINICAL DATA:  Subsequent treatment strategy for neuroendocrine tumor. Carcinoid tumor of the transverse colon. EXAM: NUCLEAR MEDICINE PET SKULL BASE TO THIGH TECHNIQUE: 3.8 mCi gallium 68 DOTATATE was injected intravenously. Full-ring PET imaging was performed from the skull base to thigh after the radiotracer. CT data was obtained and used for attenuation correction and anatomic localization. COMPARISON:  CT scan 03/02/2021, outside CT 01/08/2017 FINDINGS: NECK No radiotracer activity in neck lymph nodes. Incidental CT findings: None CHEST No radiotracer accumulation within mediastinal or hilar lymph nodes. No suspicious pulmonary nodules on the CT scan. Incidental CT finding:None ABDOMEN/PELVIS The angular calcified tissue adjacent the transverse colon is again noted measuring approximately 6 cm x 4.5 cm. This calcified tissue has no radiotracer activity. Second nodular focus with partial calcification calcified adjacent to the proximal transverse colon measures 2.3 by 1.8 cm  and also has no radiotracer activity (image 175) There is no abnormal radiotracer activity within the pancreas. No abnormal activity in the liver. No mesenteric implants or nodularity with radiotracer activity in the abdomen pelvis. Physiologic activity noted in the liver, spleen, adrenal glands and kidneys. Incidental CT findings:Atherosclerotic calcification of the aorta. Benign cyst of the LEFT kidney. Multiple diverticula of the descending colon and sigmoid colon without acute inflammation. SKELETON No focal activity to suggest skeletal metastasis. Incidental CT findings:None IMPRESSION: 1. Large calcified mass in the transverse colon mesentery without radiotracer activity. Findings are inconsistent with active well differentiated neuroendocrine tumor. 2. Second similar nodular focus adjacent to the proximal transverse colon also without radiotracer activity. 3. No evidence of active well differentiated neuroendocrine tumor on whole-body DOTATATE PET scan. Electronically Signed   By: Suzy Bouchard M.D.   On: 05/21/2021 16:52   CT OUTSIDE FILMS BODY/ABD/PELVIS  Result Date: 05/18/2021 This examination belongs to an outside facility and is stored here for comparison purposes only.  Contact the originating outside institution for any associated report or interpretation.   No images are attached to the encounter.   CMP Latest Ref Rng & Units 05/14/2021  Glucose 70 - 99 mg/dL 180(H)  BUN 8 - 27 mg/dL 19  Creatinine 0.76 - 1.27 mg/dL 1.19  Sodium 134 - 144 mmol/L 138  Potassium 3.5 - 5.2 mmol/L 5.0  Chloride 96 - 106 mmol/L 103  CO2 20 - 29 mmol/L 21  Calcium 8.6 - 10.2 mg/dL 9.8  Total Protein 6.0 - 8.5 g/dL 7.4  Total Bilirubin 0.0 - 1.2 mg/dL 0.4  Alkaline Phos 44 - 121 IU/L 95  AST 0 - 40 IU/L 12  ALT 0 - 44 IU/L 14   CBC Latest Ref Rng & Units 05/14/2021  WBC 3.4 - 10.8 x10E3/uL 10.3  Hemoglobin 13.0 - 17.7 g/dL 13.3  Hematocrit 37.5 - 51.0 % 38.1  Platelets 150 - 450 x10E3/uL 246      Assessment and plan: Patient is a 75 year old male was referred from the ER for mesenteric mass  Have reviewed patient's dotatate PET CT scan images independently and discussed findings with the patient.  I repeatedly tried to explain over the phone as I am not able to do a video visit today that his mesenteric mass has remained stable as compared to prior CT scan in 2015 and may represent sequelae of prior granulomatous disease.  I will need to get more information from the patient as to what kind  of granulomatous disease he had any in the past.  I am inclined to monitor this conservatively without a biopsy or surgical consult since this has not changed in the last 7 years and patient is 75 years of age.  However I will need to see him in person as patient is not able to understand clearly over the phone and I have requested that patient should bring his friend along with him as well.  Patient verbalized understanding  Follow-up instructions: I will see him in about 4 to 6 weeks time  I discussed the assessment and treatment plan with the patient. The patient was provided an opportunity to ask questions and all were answered. The patient agreed with the plan and demonstrated an understanding of the instructions.   The patient was advised to call back or seek an in-person evaluation if the symptoms worsen or if the condition fails to improve as anticipated.  I provided 11 minutes of non face-to-face telephone visit time during this encounter, and > 50% was spent counseling as documented under my assessment & plan.  Visit Diagnosis: 1. Calcified mesenteric mass     Dr. Randa Evens, MD, MPH [facility] at [facility] Tel- [phone] 06/11/2021 4:47 PM

## 2021-06-11 NOTE — Telephone Encounter (Signed)
Left VM with patient's friend to confirm scheduled appointment time with Dr. Janese Banks in [place] (per MD--she would like Mariann Laster to be present at visit). Left direct ext for her to call back.

## 2021-06-12 ENCOUNTER — Telehealth: Payer: Self-pay | Admitting: Oncology

## 2021-06-12 NOTE — Telephone Encounter (Signed)
Patient called and requested an appointment after 3 pm. I let him know that Dr. Janese Banks does not see established patients after 2:45. He also stated that he could only do Mondays. After offering several different days he agreed to come in on 07/27/20 at 2:45. Patient stated that he might be late and he was told that we would always try our best to accommodate but it is not always possible. He stated that he would call me back if he decides to rescheduled again.

## 2021-07-30 ENCOUNTER — Ambulatory Visit: Payer: PPO | Admitting: Oncology

## 2021-08-06 ENCOUNTER — Other Ambulatory Visit: Payer: Self-pay

## 2021-08-06 ENCOUNTER — Encounter: Payer: Self-pay | Admitting: Oncology

## 2021-08-06 ENCOUNTER — Other Ambulatory Visit: Payer: Self-pay | Admitting: *Deleted

## 2021-08-06 ENCOUNTER — Inpatient Hospital Stay: Payer: PPO | Attending: Oncology | Admitting: Oncology

## 2021-08-06 VITALS — BP 150/66 | HR 56 | Temp 99.2°F | Resp 16 | Ht 67.0 in | Wt 169.0 lb

## 2021-08-06 DIAGNOSIS — I10 Essential (primary) hypertension: Secondary | ICD-10-CM | POA: Insufficient documentation

## 2021-08-06 DIAGNOSIS — Z885 Allergy status to narcotic agent status: Secondary | ICD-10-CM | POA: Insufficient documentation

## 2021-08-06 DIAGNOSIS — Z833 Family history of diabetes mellitus: Secondary | ICD-10-CM | POA: Diagnosis not present

## 2021-08-06 DIAGNOSIS — R1900 Intra-abdominal and pelvic swelling, mass and lump, unspecified site: Secondary | ICD-10-CM | POA: Insufficient documentation

## 2021-08-06 DIAGNOSIS — Z888 Allergy status to other drugs, medicaments and biological substances status: Secondary | ICD-10-CM | POA: Diagnosis not present

## 2021-08-06 DIAGNOSIS — K668 Other specified disorders of peritoneum: Secondary | ICD-10-CM | POA: Diagnosis not present

## 2021-08-06 DIAGNOSIS — Z881 Allergy status to other antibiotic agents status: Secondary | ICD-10-CM | POA: Diagnosis not present

## 2021-08-06 DIAGNOSIS — Z79899 Other long term (current) drug therapy: Secondary | ICD-10-CM | POA: Diagnosis not present

## 2021-08-06 DIAGNOSIS — Z8249 Family history of ischemic heart disease and other diseases of the circulatory system: Secondary | ICD-10-CM | POA: Diagnosis not present

## 2021-08-06 DIAGNOSIS — E119 Type 2 diabetes mellitus without complications: Secondary | ICD-10-CM | POA: Diagnosis not present

## 2021-08-06 DIAGNOSIS — Z87442 Personal history of urinary calculi: Secondary | ICD-10-CM | POA: Diagnosis not present

## 2021-08-06 DIAGNOSIS — E785 Hyperlipidemia, unspecified: Secondary | ICD-10-CM | POA: Insufficient documentation

## 2021-08-06 NOTE — Progress Notes (Signed)
Hematology/Oncology Consult note [facility]  Telephone:([phone] [phone]  Patient Care Team: Valerie Roys, DO as PCP - General (Family Medicine) Clent Jacks, RN as Oncology Nurse Navigator   Name of the patient: Russell Byrd  [id number]  Dec 24, 1945   Date of visit: 08/06/21  Diagnosis-intra-abdominal calcified mass of unclear etiology  Chief complaint/ Reason for visit-discuss dotatate PET scan results and further management  Heme/Onc history: Patient is a 76 year old male with a past medical history significant for hypertension hyperlipidemia and type 2 diabetes who presented to the ER on 05/03/2021 with symptoms of flank pain which prompted a CT scan of the abdomen.  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  Patient referred for further management.  He reports that his left flank pain has moved somewhat but he still has pain in his left groin that radiates to his left thigh which makes it difficult for him to walk   With regards to mesenteric mass patient has had prior abdominal imagingAt [facility] in 2015 as well as 2018.  Even in 2015 patient was found to have 4 x 3.4 cm partially calcified soft tissue mass tenting the mid transverse colon and in 2018 this mass was unchanged at around 4 cm as well.   Dotatate PET scan showed large calcified mass in the transverse colon mesentery without radiotracer activity findings not consistent with neuroendocrine tumor.  No other evidence of active well-differentiated neuroendocrine tumor    Interval history-patient reports doing well overall.  Denies any abdominal pain.  Bowel movements have been regular  ECOG PS- 1 Pain scale- 0   Review of systems- Review of Systems  Constitutional:  Positive for malaise/fatigue. Negative for chills, fever and weight loss.  HENT:  Negative for congestion, ear  discharge and nosebleeds.   Eyes:  Negative for blurred vision.  Respiratory:  Negative for cough, hemoptysis, sputum production, shortness of breath and wheezing.   Cardiovascular:  Negative for chest pain, palpitations, orthopnea and claudication.  Gastrointestinal:  Negative for abdominal pain, blood in stool, constipation, diarrhea, heartburn, melena, nausea and vomiting.  Genitourinary:  Negative for dysuria, flank pain, frequency, hematuria and urgency.  Musculoskeletal:  Negative for back pain, joint pain and myalgias.  Skin:  Negative for rash.  Neurological:  Negative for dizziness, tingling, focal weakness, seizures, weakness and headaches.  Endo/Heme/Allergies:  Does not bruise/bleed easily.  Psychiatric/Behavioral:  Negative for depression and suicidal ideas. The patient does not have insomnia.      Allergies  Allergen Reactions   Prednisone Other (See Comments)    steroids   Tetracycline     Other reaction(s): Unknown   Tetracyclines & Related    Tramadol Other (See Comments)    Keeps him awake     Past Medical History:  Diagnosis Date   Diabetes mellitus without complication ([place])    History of kidney stones    Hyperlipidemia    Hypertension      Past Surgical History:  Procedure Laterality Date   KNEE SURGERY Bilateral    arthroscopic    Social History   Socioeconomic History   Marital status: Married    Spouse name: Not on file   Number of children: Not on file   Years of education: Not on file   Highest education level: Not on file  Occupational History   Not on file  Tobacco Use  Smoking status: Never   Smokeless tobacco: Current    Types: Chew  Vaping Use   Vaping Use: Never used  Substance and Sexual Activity   Alcohol use: Not Currently   Drug use: No   Sexual activity: Not Currently  Other Topics Concern   Not on file  Social History Narrative   Not on file   Social Determinants of Health   Financial Resource Strain: Not on  file  Food Insecurity: Not on file  Transportation Needs: Not on file  Physical Activity: Not on file  Stress: Not on file  Social Connections: Not on file  Intimate Partner Violence: Not on file    Family History  Problem Relation Age of Onset   Diabetes Father    Heart disease Father    Diabetes Mother      Current Outpatient Medications:    Acetaminophen (TYLENOL 8 HOUR PO), Take by mouth daily., Disp: , Rfl:    atenolol (TENORMIN) 25 MG tablet, Take 1 tablet (25 mg total) by mouth 2 (two) times daily., Disp: 180 tablet, Rfl: 1   atorvastatin (LIPITOR) 20 MG tablet, Take 1 tablet (20 mg total) by mouth daily., Disp: 90 tablet, Rfl: 1   benazepril (LOTENSIN) 40 MG tablet, Take 1 tablet (40 mg total) by mouth daily., Disp: 90 tablet, Rfl: 1   blood glucose meter kit and supplies KIT, Dispense based on patient and insurance preference; One touch. Use up to three times daily as directed. (FOR ICD-9 250.00, 250.01)., Disp: 1 each, Rfl: 12   hydrochlorothiazide (HYDRODIURIL) 25 MG tablet, Take 1 tablet (25 mg total) by mouth daily., Disp: 90 tablet, Rfl: 1   Ibuprofen 200 MG CAPS, Take by mouth., Disp: , Rfl:    lansoprazole (PREVACID) 30 MG capsule, Take 1 capsule (30 mg total) by mouth daily at 12 noon., Disp: 90 capsule, Rfl: 1   metFORMIN (GLUCOPHAGE) 500 MG tablet, Take 2 tablets (1,000 mg total) by mouth 2 (two) times daily with a meal., Disp: 360 tablet, Rfl: 1   Misc Natural Products (OSTEO BI-FLEX/5-LOXIN ADVANCED PO), Take by mouth., Disp: , Rfl:    Multiple Vitamins-Minerals (CENTRUM SILVER ADULT 50+) TABS, Take by mouth daily., Disp: , Rfl:    OneTouch Delica Lancets 69S MISC, Apply topically., Disp: , Rfl:    ONETOUCH ULTRA test strip, 1 each 2 (two) times daily., Disp: , Rfl:    vitamin E 400 UNIT capsule, Take 400 Units by mouth daily., Disp: , Rfl:    Zinc 50 MG CAPS, Take 50 mg by mouth daily., Disp: , Rfl:    baclofen (LIORESAL) 10 MG tablet, Take 1 tablet (10 mg  total) by mouth at bedtime as needed for muscle spasms. (Patient not taking: Reported on 08/06/2021), Disp: 30 each, Rfl: 3   clorazepate (TRANXENE) 3.75 MG tablet, Take 1 tablet (3.75 mg total) by mouth 2 (two) times daily. (Patient not taking: Reported on 08/06/2021), Disp: 60 tablet, Rfl: 2   docusate sodium (COLACE) 100 MG capsule, Take 1 tablet once or twice daily as needed for constipation while taking narcotic pain medicine (Patient not taking: Reported on 08/06/2021), Disp: 30 capsule, Rfl: 0   naproxen (NAPROSYN) 500 MG tablet, Take 1 tablet (500 mg total) by mouth 2 (two) times daily with a meal. (Patient not taking: Reported on 06/11/2021), Disp: 60 tablet, Rfl: 3   oxyCODONE-acetaminophen (PERCOCET) 5-325 MG tablet, Take 2 tablets by mouth every 6 (six) hours as needed for severe pain. (Patient not taking:  Reported on 08/06/2021), Disp: 16 tablet, Rfl: 0  Physical exam:  Vitals:   08/06/21 1456  BP: (!) 150/66  Pulse: (!) 56  Resp: 16  Temp: 99.2 F (37.3 C)  TempSrc: Tympanic  SpO2: 100%  Weight: 169 lb (76.7 kg)  Height: _0  (1.702 m)   Physical Exam Constitutional:      General: He is not in acute distress. Cardiovascular:     Rate and Rhythm: Normal rate and regular rhythm.     Heart sounds: Normal heart sounds.  Pulmonary:     Effort: Pulmonary effort is normal.     Breath sounds: Normal breath sounds.  Abdominal:     General: Bowel sounds are normal.     Palpations: Abdomen is soft.  Skin:    General: Skin is warm and dry.  Neurological:     Mental Status: He is alert and oriented to person, place, and time.     CMP Latest Ref Rng & Units 05/14/2021  Glucose 70 - 99 mg/dL 180(H)  BUN 8 - 27 mg/dL 19  Creatinine 0.76 - 1.27 mg/dL 1.19  Sodium 134 - 144 mmol/L 138  Potassium 3.5 - 5.2 mmol/L 5.0  Chloride 96 - 106 mmol/L 103  CO2 20 - 29 mmol/L 21  Calcium 8.6 - 10.2 mg/dL 9.8  Total Protein 6.0 - 8.5 g/dL 7.4  Total Bilirubin 0.0 - 1.2 mg/dL 0.4  Alkaline  Phos 44 - 121 IU/L 95  AST 0 - 40 IU/L 12  ALT 0 - 44 IU/L 14   CBC Latest Ref Rng & Units 05/14/2021  WBC 3.4 - 10.8 x10E3/uL 10.3  Hemoglobin 13.0 - 17.7 g/dL 13.3  Hematocrit 37.5 - 51.0 % 38.1  Platelets 150 - 450 x10E3/uL 246      Assessment and plan- Patient is a 76 y.o. male with calcified mesenteric mass of unclear etiology  I have reviewed dotatate PET scan images independently and discussed findings with the patient which shows a large calcified 6 cm mass in the mesentery of the transverse colon which did not have any radiotracer activity on dotatate PET scan and therefore not consistent with a well-differentiated neuroendocrine tumor.  He also noted to have another focus adjacent to proximal transverse colon without radiotracer activity.  This mass was seen on his prior CT abdomen even dating back to 2015 when it was reported to be around 4 cm.  This present scan does not show or report any significant enlargement in this mass.  I therefore do not recommend any surgical management or resection of this mass at his age.  Given the location of the mass prostate biopsy would also not be possible.  I would recommend watchful monitoring and repeat CT abdomen and pelvis with contrast in July 2023.  If overall findings remain stable we could consider getting another CT a year later or no surveillance scans at that time.  Patient verbalized understanding of the plan  visit Diagnosis 1. Calcified mesenteric mass      Dr. Randa Evens, MD, MPH [facility] [phone] 08/06/2021 4:28 PM

## 2021-08-20 ENCOUNTER — Ambulatory Visit: Payer: PPO | Admitting: Oncology

## 2021-11-12 ENCOUNTER — Encounter: Payer: PPO | Admitting: Family Medicine

## 2021-11-19 ENCOUNTER — Encounter: Payer: Self-pay | Admitting: Family Medicine

## 2021-11-19 ENCOUNTER — Ambulatory Visit (INDEPENDENT_AMBULATORY_CARE_PROVIDER_SITE_OTHER): Payer: PPO | Admitting: Family Medicine

## 2021-11-19 VITALS — BP 156/64 | Temp 98.0°F | Wt 171.0 lb

## 2021-11-19 DIAGNOSIS — I152 Hypertension secondary to endocrine disorders: Secondary | ICD-10-CM

## 2021-11-19 DIAGNOSIS — E1159 Type 2 diabetes mellitus with other circulatory complications: Secondary | ICD-10-CM | POA: Diagnosis not present

## 2021-11-19 DIAGNOSIS — N400 Enlarged prostate without lower urinary tract symptoms: Secondary | ICD-10-CM | POA: Diagnosis not present

## 2021-11-19 DIAGNOSIS — Z0000 Encounter for general adult medical examination without abnormal findings: Secondary | ICD-10-CM

## 2021-11-19 DIAGNOSIS — K668 Other specified disorders of peritoneum: Secondary | ICD-10-CM | POA: Diagnosis not present

## 2021-11-19 DIAGNOSIS — E785 Hyperlipidemia, unspecified: Secondary | ICD-10-CM | POA: Diagnosis not present

## 2021-11-19 DIAGNOSIS — E1169 Type 2 diabetes mellitus with other specified complication: Secondary | ICD-10-CM

## 2021-11-19 DIAGNOSIS — Z23 Encounter for immunization: Secondary | ICD-10-CM

## 2021-11-19 DIAGNOSIS — F419 Anxiety disorder, unspecified: Secondary | ICD-10-CM | POA: Diagnosis not present

## 2021-11-19 DIAGNOSIS — D692 Other nonthrombocytopenic purpura: Secondary | ICD-10-CM | POA: Insufficient documentation

## 2021-11-19 LAB — MICROSCOPIC EXAMINATION
Bacteria, UA: NONE SEEN
Epithelial Cells (non renal): NONE SEEN /hpf (ref 0–10)
RBC, Urine: NONE SEEN /hpf (ref 0–2)
WBC, UA: NONE SEEN /hpf (ref 0–5)

## 2021-11-19 LAB — URINALYSIS, ROUTINE W REFLEX MICROSCOPIC
Bilirubin, UA: NEGATIVE
Glucose, UA: NEGATIVE
Ketones, UA: NEGATIVE
Leukocytes,UA: NEGATIVE
Nitrite, UA: NEGATIVE
RBC, UA: NEGATIVE
Specific Gravity, UA: 1.025 (ref 1.005–1.030)
Urobilinogen, Ur: 0.2 mg/dL (ref 0.2–1.0)
pH, UA: 5.5 (ref 5.0–7.5)

## 2021-11-19 LAB — MICROALBUMIN, URINE WAIVED
Creatinine, Urine Waived: 200 mg/dL (ref 10–300)
Microalb, Ur Waived: 150 mg/L — ABNORMAL HIGH (ref 0–19)

## 2021-11-19 LAB — BAYER DCA HB A1C WAIVED: HB A1C (BAYER DCA - WAIVED): 8 % — ABNORMAL HIGH (ref 4.8–5.6)

## 2021-11-19 MED ORDER — ATENOLOL 25 MG PO TABS: 25.0000 mg | ORAL_TABLET | Freq: Two times a day (BID) | ORAL | 1 refills | Status: DC

## 2021-11-19 MED ORDER — ATORVASTATIN CALCIUM 20 MG PO TABS
20.0000 mg | ORAL_TABLET | Freq: Every day | ORAL | 1 refills | Status: DC
Start: 2021-11-19 — End: 2022-05-27

## 2021-11-19 MED ORDER — LANSOPRAZOLE 30 MG PO CPDR: 30.0000 mg | DELAYED_RELEASE_CAPSULE | Freq: Every day | ORAL | 1 refills | Status: DC

## 2021-11-19 MED ORDER — BENAZEPRIL HCL 40 MG PO TABS
40.0000 mg | ORAL_TABLET | Freq: Every day | ORAL | 1 refills | Status: DC
Start: 2021-11-19 — End: 2022-05-27

## 2021-11-19 MED ORDER — AMLODIPINE BESYLATE 2.5 MG PO TABS: 2.5000 mg | ORAL_TABLET | Freq: Every day | ORAL | 2 refills | Status: DC

## 2021-11-19 MED ORDER — METFORMIN HCL 500 MG PO TABS
1000.0000 mg | ORAL_TABLET | Freq: Two times a day (BID) | ORAL | 1 refills | Status: DC
Start: 2021-11-19 — End: 2022-05-27

## 2021-11-19 NOTE — Assessment & Plan Note (Signed)
Doing well off medicine. Continue to monitor. Call with any concerns. Continue to monitor.  ?

## 2021-11-19 NOTE — Assessment & Plan Note (Signed)
A1c up at 8.0. Has been eating more raisin bran. Will cut back and we'll recheck in 3 months. Call with any concerns.  ?

## 2021-11-19 NOTE — Assessment & Plan Note (Signed)
Reassured patient. Continue to monitor.  

## 2021-11-19 NOTE — Assessment & Plan Note (Signed)
Under good control on current regimen. Continue current regimen. Continue to monitor. Call with any concerns. Refills given. Labs drawn today.   

## 2021-11-19 NOTE — Progress Notes (Signed)
? ?BP (!) 156/64   Temp 98 ?F (36.7 ?C)   Wt 171 lb (77.6 kg)   SpO2 99%   BMI 26.78 kg/m?   ? ?Subjective:  ? ? Patient ID: Russell Byrd, male    DOB: 1945/08/08, 76 y.o.   MRN: [id number] ? ?HPI: ?Russell Byrd is a 76 y.o. male presenting on 11/19/2021 for comprehensive medical examination. Current medical complaints include: ? ?DIABETES ?Hypoglycemic episodes:no ?Polydipsia/polyuria: yes ?Visual disturbance: no ?Chest pain: no ?Paresthesias: yes ?Glucose Monitoring: no ? Accucheck frequency: Not Checking ?Taking Insulin?: no ?Blood Pressure Monitoring: not checking ?Retinal Examination: Not up to Date ?Foot Exam: Up to Date ?Diabetic Education: Completed ?Pneumovax: Up to Date ?Influenza: Up to Date ?Aspirin: yes ? ?HYPERTENSION / HYPERLIPIDEMIA ?Satisfied with current treatment? yes ?Duration of hypertension: chronic ?BP monitoring frequency: not checking ?BP medication side effects: yes- urinary frequency at night ?Duration of hyperlipidemia: chronic ?Cholesterol medication side effects: no ?Cholesterol supplements: none ?Past cholesterol medications: atorvastatin ?Medication compliance: excellent compliance ?Aspirin: no ?Recent stressors: no ?Recurrent headaches: no ?Visual changes: no ?Palpitations: no ?Dyspnea: no ?Chest pain: no ?Lower extremity edema: no ?Dizzy/lightheaded: yes ? ?ANXIETY/STRESS- stopped his chlorazepate in November.  ?Duration: chronic ?Status:controlled ?Anxious mood: yes  ?Excessive worrying: no ?Irritability: no  ?Sweating: no ?Nausea: no ?Palpitations:no ?Hyperventilation: no ?Panic attacks: no ?Agoraphobia: no  ?Obscessions/compulsions: no ?Depressed mood: no ? ?  11/13/2020  ?  4:14 PM 10/23/2020  ?  3:50 PM 07/18/2020  ?  3:48 PM 04/17/2020  ?  4:38 PM 01/17/2020  ?  4:01 PM  ?Depression screen PHQ 2/9  ?Decreased Interest 0 0 0 0 0  ?Down, Depressed, Hopeless 0 0 0 0 0  ?PHQ - 2 Score 0 0 0 0 0  ?Altered sleeping 0 0 0  0  ?Tired, decreased energy 0 0 0  0  ?Change in  appetite 0 0 0  0  ?Feeling bad or failure about yourself  0 0 0  0  ?Trouble concentrating 0 0 0  0  ?Moving slowly or fidgety/restless 0 0 0  0  ?Suicidal thoughts 0 0 0    ?PHQ-9 Score 0 0 0  0  ?Difficult doing work/chores   Not difficult at all    ? ?Anhedonia: no ?Weight changes: no ?Insomnia: no   ?Hypersomnia: no ?Fatigue/loss of energy: no ?Feelings of worthlessness: no ?Feelings of guilt: no ?Impaired concentration/indecisiveness: no ?Suicidal ideations: no  ?Crying spells: no ?Recent Stressors/Life Changes: no ?  Relationship problems: no ?  Family stress: no   ?  Financial stress: no  ?  Job stress: no  ?  Recent death/loss: no ? ?Interim Problems from his last visit: no ? ?Depression Screen done today and results listed below:  ? ?  11/13/2020  ?  4:14 PM 10/23/2020  ?  3:50 PM 07/18/2020  ?  3:48 PM 04/17/2020  ?  4:38 PM 01/17/2020  ?  4:01 PM  ?Depression screen PHQ 2/9  ?Decreased Interest 0 0 0 0 0  ?Down, Depressed, Hopeless 0 0 0 0 0  ?PHQ - 2 Score 0 0 0 0 0  ?Altered sleeping 0 0 0  0  ?Tired, decreased energy 0 0 0  0  ?Change in appetite 0 0 0  0  ?Feeling bad or failure about yourself  0 0 0  0  ?Trouble concentrating 0 0 0  0  ?Moving slowly or fidgety/restless 0 0 0  0  ?Suicidal thoughts 0 0  0    ?PHQ-9 Score 0 0 0  0  ?Difficult doing work/chores   Not difficult at all    ? ? ?Past Medical History:  ?Past Medical History:  ?Diagnosis Date  ? Diabetes mellitus without complication ([place])   ? History of kidney stones   ? Hyperlipidemia   ? Hypertension   ? ? ?Surgical History:  ?Past Surgical History:  ?Procedure Laterality Date  ? KNEE SURGERY Bilateral   ? arthroscopic  ? ? ?Medications:  ?Current Outpatient Medications on File Prior to Visit  ?Medication Sig  ? Acetaminophen (TYLENOL 8 HOUR PO) Take by mouth daily.  ? blood glucose meter kit and supplies KIT Dispense based on patient and insurance preference; One touch. Use up to three times daily as directed. (FOR ICD-9 250.00, 250.01).  ?  Ibuprofen 200 MG CAPS Take by mouth.  ? Misc Natural Products (OSTEO BI-FLEX/5-LOXIN ADVANCED PO) Take by mouth.  ? Multiple Vitamins-Minerals (CENTRUM SILVER ADULT 50+) TABS Take by mouth daily.  ? OneTouch Delica Lancets 82N MISC Apply topically.  ? ONETOUCH ULTRA test strip 1 each 2 (two) times daily.  ? vitamin E 400 UNIT capsule Take 400 Units by mouth daily.  ? Zinc 50 MG CAPS Take 50 mg by mouth daily.  ? ?No current facility-administered medications on file prior to visit.  ? ? ?Allergies:  ?Allergies  ?Allergen Reactions  ? Prednisone Other (See Comments)  ?  steroids  ? Tetracycline   ?  Other reaction(s): Unknown  ? Tetracyclines & Related   ? Tramadol Other (See Comments)  ?  Keeps him awake  ? ? ?Social History:  ?Social History  ? ?Socioeconomic History  ? Marital status: Married  ?  Spouse name: Not on file  ? Number of children: Not on file  ? Years of education: Not on file  ? Highest education level: Not on file  ?Occupational History  ? Not on file  ?Tobacco Use  ? Smoking status: Never  ? Smokeless tobacco: Current  ?  Types: Chew  ?Vaping Use  ? Vaping Use: Never used  ?Substance and Sexual Activity  ? Alcohol use: Not Currently  ? Drug use: No  ? Sexual activity: Not Currently  ?Other Topics Concern  ? Not on file  ?Social History Narrative  ? Not on file  ? ?Social Determinants of Health  ? ?Financial Resource Strain: Not on file  ?Food Insecurity: Not on file  ?Transportation Needs: Not on file  ?Physical Activity: Not on file  ?Stress: Not on file  ?Social Connections: Not on file  ?Intimate Partner Violence: Not on file  ? ?Social History  ? ?Tobacco Use  ?Smoking Status Never  ?Smokeless Tobacco Current  ? Types: Chew  ? ?Social History  ? ?Substance and Sexual Activity  ?Alcohol Use Not Currently  ? ? ?Family History:  ?Family History  ?Problem Relation Age of Onset  ? Diabetes Father   ? Heart disease Father   ? Diabetes Mother   ? ? ?Past medical history, surgical history, medications,  allergies, family history and social history reviewed with patient today and changes made to appropriate areas of the chart.  ? ?Review of Systems  ?Constitutional:  Positive for diaphoresis. Negative for chills, fever, malaise/fatigue and weight loss.  ?HENT:  Positive for tinnitus. Negative for congestion, ear discharge, ear pain, hearing loss, nosebleeds, sinus pain and sore throat.   ?Eyes: Negative.   ?Respiratory: Negative.  Negative for stridor.   ?Cardiovascular:  Negative.   ?Gastrointestinal: Negative.   ?Genitourinary:  Positive for frequency. Negative for dysuria, flank pain, hematuria and urgency.  ?Musculoskeletal:  Positive for joint pain. Negative for back pain, falls, myalgias and neck pain.  ?Skin: Negative.   ?Neurological:  Positive for dizziness. Negative for tingling, tremors, sensory change, speech change, focal weakness, seizures, loss of consciousness, weakness and headaches.  ?Endo/Heme/Allergies:  Negative for environmental allergies and polydipsia. Bruises/bleeds easily.  ?Psychiatric/Behavioral:  Negative for depression, hallucinations, memory loss, substance abuse and suicidal ideas. The patient is nervous/anxious. The patient does not have insomnia.   ?All other ROS negative except what is listed above and in the HPI.  ? ?   ?Objective:  ?  ?BP (!) 156/64   Temp 98 ?F (36.7 ?C)   Wt 171 lb (77.6 kg)   SpO2 99%   BMI 26.78 kg/m?   ?Wt Readings from Last 3 Encounters:  ?11/19/21 171 lb (77.6 kg)  ?08/06/21 169 lb (76.7 kg)  ?05/14/21 168 lb (76.2 kg)  ?  ?Physical Exam ?Vitals and nursing note reviewed.  ?Constitutional:   ?   General: He is not in acute distress. ?   Appearance: Normal appearance. He is obese. He is not ill-appearing, toxic-appearing or diaphoretic.  ?HENT:  ?   Head: Normocephalic and atraumatic.  ?   Right Ear: Tympanic membrane, ear canal and external ear normal. There is no impacted cerumen.  ?   Left Ear: Tympanic membrane, ear canal and external ear normal.  There is no impacted cerumen.  ?   Nose: Nose normal. No congestion or rhinorrhea.  ?   Mouth/Throat:  ?   Mouth: Mucous membranes are moist.  ?   Pharynx: Oropharynx is clear. No oropharyngeal exudate or posterior orop

## 2021-11-19 NOTE — Assessment & Plan Note (Signed)
No sign of carcinoid on PET scan. Oncology suggests follow up CT in October 2023. Continue to monitor. Call with any concerns.  ?

## 2021-11-19 NOTE — Assessment & Plan Note (Signed)
BP running high and having increased urination at night. Will stop HCTZ and start low dose amlodipine. Will check in by phone in 2 weeks to see about swelling. In person BP check in 1-3 months with BMP.  ?

## 2021-11-19 NOTE — Assessment & Plan Note (Signed)
Rechecking labs today. If nocturia does not improve with stopping HCTZ consider flomax.  ?

## 2021-11-20 ENCOUNTER — Encounter: Payer: Self-pay | Admitting: Family Medicine

## 2021-11-20 LAB — COMPREHENSIVE METABOLIC PANEL
ALT: 13 IU/L (ref 0–44)
AST: 12 IU/L (ref 0–40)
Albumin/Globulin Ratio: 1.6 (ref 1.2–2.2)
Albumin: 4.5 g/dL (ref 3.7–4.7)
Alkaline Phosphatase: 96 IU/L (ref 44–121)
BUN/Creatinine Ratio: 19 (ref 10–24)
BUN: 21 mg/dL (ref 8–27)
Bilirubin Total: 0.4 mg/dL (ref 0.0–1.2)
CO2: 21 mmol/L (ref 20–29)
Calcium: 9.5 mg/dL (ref 8.6–10.2)
Chloride: 101 mmol/L (ref 96–106)
Creatinine, Ser: 1.09 mg/dL (ref 0.76–1.27)
Globulin, Total: 2.8 g/dL (ref 1.5–4.5)
Glucose: 173 mg/dL — ABNORMAL HIGH (ref 70–99)
Potassium: 4.7 mmol/L (ref 3.5–5.2)
Sodium: 138 mmol/L (ref 134–144)
Total Protein: 7.3 g/dL (ref 6.0–8.5)
eGFR: 70 mL/min/{1.73_m2} (ref >59)

## 2021-11-20 LAB — CBC WITH DIFFERENTIAL/PLATELET
Basophils Absolute: 0 10*3/uL (ref 0.0–0.2)
Basos: 0 %
EOS (ABSOLUTE): 0.1 10*3/uL (ref 0.0–0.4)
Eos: 1 %
Hematocrit: 37.9 % (ref 37.5–51.0)
Hemoglobin: 13.2 g/dL (ref 13.0–17.7)
Immature Grans (Abs): 0.1 10*3/uL (ref 0.0–0.1)
Immature Granulocytes: 1 %
Lymphocytes Absolute: 1.8 10*3/uL (ref 0.7–3.1)
Lymphs: 18 %
MCH: 30.8 pg (ref 26.6–33.0)
MCHC: 34.8 g/dL (ref 31.5–35.7)
MCV: 88 fL (ref 79–97)
Monocytes Absolute: 0.7 10*3/uL (ref 0.1–0.9)
Monocytes: 7 %
Neutrophils Absolute: 7.3 10*3/uL — ABNORMAL HIGH (ref 1.4–7.0)
Neutrophils: 73 %
Platelets: 199 10*3/uL (ref 150–450)
RBC: 4.29 x10E6/uL (ref 4.14–5.80)
RDW: 12.2 % (ref 11.6–15.4)
WBC: 10 10*3/uL (ref 3.4–10.8)

## 2021-11-20 LAB — LIPID PANEL W/O CHOL/HDL RATIO
Cholesterol, Total: 152 mg/dL (ref 100–199)
HDL: 34 mg/dL — ABNORMAL LOW (ref >39)
LDL Chol Calc (NIH): 91 mg/dL (ref 0–99)
Triglycerides: 150 mg/dL — ABNORMAL HIGH (ref 0–149)
VLDL Cholesterol Cal: 27 mg/dL (ref 5–40)

## 2021-11-20 LAB — TSH: TSH: 3.61 u[IU]/mL (ref 0.450–4.500)

## 2021-11-20 LAB — PSA: Prostate Specific Ag, Serum: 0.3 ng/mL (ref 0.0–4.0)

## 2021-11-23 ENCOUNTER — Other Ambulatory Visit: Payer: Self-pay | Admitting: Family Medicine

## 2021-11-23 MED ORDER — AMLODIPINE BESYLATE 2.5 MG PO TABS: 2.5000 mg | ORAL_TABLET | Freq: Every day | ORAL | 0 refills | Status: DC

## 2021-11-23 NOTE — Telephone Encounter (Signed)
Russell Byrd w/ [facility] mail order calling to ask if they can change the amLODipine (NORVASC) 2.5 MG tablet To a 90 day supply.  It was sent as 30 tabs w/ 2 refills.  She says as a mail order, they need a 90 day supply.

## 2021-11-23 NOTE — Telephone Encounter (Signed)
Resending as per original order from provider as 90 day supply.   Requested Prescriptions  Pending Prescriptions Disp Refills  . amLODipine (NORVASC) 2.5 MG tablet 90 tablet 0    Sig: Take 1 tablet (2.5 mg total) by mouth daily.     Cardiovascular: Calcium Channel Blockers 2 Failed - 11/23/2021  9:30 AM      Failed - Last BP in normal range    BP Readings from Last 1 Encounters:  11/19/21 (!) 156/64         Passed - Last Heart Rate in normal range    Pulse Readings from Last 1 Encounters:  08/06/21 (!) 56         Passed - Valid encounter within last 6 months    Recent Outpatient Visits          4 days ago Routine general medical examination at a health care facility   [facility], [place], DO   6 months ago Sciatica, left side   [place], [place], DO   8 months ago [place], [place], MD   9 months ago Diabetes mellitus associated with hormonal etiology [facility])   [place], Marshall P, DO   1 year ago Wellness examination   [facility], [place], DO      Future Appointments            In 2 months Wynetta Emery, Barb Merino, DO [employer], [facility]

## 2021-12-04 ENCOUNTER — Telehealth: Payer: Self-pay | Admitting: Family Medicine

## 2021-12-04 NOTE — Telephone Encounter (Signed)
Can we please check to see if his feet are swelling on amlodipine- please call in the PM, he sleeps weird hours

## 2021-12-04 NOTE — Telephone Encounter (Signed)
-----   Message from Valerie Roys, [place] sent at 11/19/2021  3:40 PM EDT ----- Call to check in on if his feet are swelling on the amlodipine

## 2021-12-05 MED ORDER — TAMSULOSIN HCL 0.4 MG PO CAPS: 0.4000 mg | ORAL_CAPSULE | Freq: Every day | ORAL | 0 refills | Status: DC

## 2021-12-05 NOTE — Telephone Encounter (Signed)
Called and spoke to patient. He states that he is not having swelling in his feet yet. States he is still having to get up every hour during the night to use the bathroom though.

## 2021-12-05 NOTE — Telephone Encounter (Signed)
Patient is okay with medication being sent in to [facility]

## 2021-12-05 NOTE — Telephone Encounter (Signed)
I would like to send him in some medicine to help with the urine if he's OK with it (flomax)

## 2021-12-12 ENCOUNTER — Ambulatory Visit: Payer: Self-pay

## 2021-12-12 NOTE — Telephone Encounter (Signed)
Routing to provider as an FYI

## 2021-12-12 NOTE — Telephone Encounter (Signed)
Pt called in again, states hasnt received flomax medicine, shows [facility] mail order received in 05/31, but pt still doesn't have it.  Please call back with status Herbalist ([place], [place] Phone:  [phone]  Fax:  [phone]

## 2021-12-12 NOTE — Telephone Encounter (Signed)
  Chief Complaint: urinary frequency Symptoms: urinary frequency every hr at night Frequency: ongoing issue Pertinent Negatives: NA Disposition: '[]'$ ED /'[]'$ Urgent Care (no appt availability in office) / '[]'$ Appointment(In office/virtual)/ '[]'$  [facility]/ '[]'$ Home Care/ '[]'$ Refused Recommended Disposition /'[]'$ [facility]/ '[x]'$  Follow-up with PCP Additional Notes: Dr. Wynetta Emery sent in rx for tamulosin on 12/05/21 to mail order pharmacy. I asked pt if he had received yet and he said no unless come today. Offered to switch to local pharmacy but pt states he is unable to go get it and would be Saturday before going to town. I advised pt I would call the mail order pharmacy and see what the status was on him getting the medicaton. Pt states he wants Dr. Wynetta Emery to know the amlodipine causing swelling so he took hctz yesterday but didn't take it today d/t up all night urinating. I advised him I would let Dr. Wynetta Emery know.   I called and spoke with Sharyn Lull, agent at [facility] and she advised me that the medication was filled yesterday and shipping today and can take up to 3-5 days for pt to get it.    Summary: Pt requesting medication and declined appt   Pt requests call back regarding medications for his prostate. Pt declined to schedule an appt. Pt then stated he discussed this with Dr. Wynetta Emery during his appt on 11/19/21. Pt complains that he is not able to sleep due to having to keep getting up to use the bathroom. Pt also stated that he had to change back to one of his other medications because he was experiencing some swelling in his legs and feet. Pt requests return call asap. Cb# [phone]      Reason for Disposition  Urinating more frequently than usual (i.e., frequency)  Answer Assessment - Initial Assessment Questions 1. SYMPTOM: "What's the main symptom you're concerned about?" (e.g., frequency, incontinence)     Getting up every hr to pee 2. ONSET: "When did the  sx   start?"     Ongoing issue  Protocols used: Urinary Symptoms-A-AH

## 2021-12-13 NOTE — Telephone Encounter (Signed)
Pt had called back to let me know he hasn't received the medication in mail yet. I advised him I spoke with Sharyn Lull yesterday at the pharmacy and she told me that it was shipped yesterday and could take 3-5 days. I asked pt if he wanted it sent to local pharmacy but he said he is unable to get there since its 30 miles away. I explained that hopefully itll be there Saturday but if not there by Monday to call office and maybe we can reach out to pharmacy and get a tracking #. Pt verbalized understanding.

## 2021-12-13 NOTE — Telephone Encounter (Signed)
See nurse triage note from yesterday. Pharmacy was contacted and we were advised that the medication was shipped the day before, could be 3 to 5 days to get to the patient.   Tried calling patient to advise him of this, patient did not answer and no VM set u.   OK for PEC to give patient the message if he calls back.

## 2021-12-14 NOTE — Telephone Encounter (Signed)
Called and LVM notifying patient that his medication has shipped and should be arriving within the next few days.

## 2021-12-20 ENCOUNTER — Ambulatory Visit: Payer: Self-pay | Admitting: *Deleted

## 2021-12-20 NOTE — Telephone Encounter (Signed)
  Chief Complaint: requesting a different medication than flomax  Symptoms: continues to urinate at night, developed swelling in face , and legs up to knees after taking flomax. C/o rapid heart rate but not now. Dizziness. Pain in left hip down leg. Last seen 11/19/21 in office .  Frequency: after starting flomax Pertinent Negatives: Patient denies chest pain , difficulty breathing. No fever Disposition: '[]'$ ED /'[]'$ Urgent Care (no appt availability in office) / '[]'$ Appointment(In office/virtual)/ '[]'$  [facility]/ '[]'$ Home Care/ '[x]'$ Refused Recommended Disposition /'[]'$ [facility]/ '[x]'$  Follow-up with PCP Additional Notes:   Refused to be seen within 24hours. No available appt with PCP until next week. Patient does not want appt due to mobility issues. Patient is stopping flomax tonight. Requesting doxycyline 100 mg to be sent to [facility] [place]. Please try to send today per patient. Would like a call back with what PCP can do. Please advise .    Reason for Disposition  [1] MODERATE leg swelling (e.g., swelling extends up to knees) AND [2] new-onset or worsening  Answer Assessment - Initial Assessment Questions 1. ONSET: "When did the swelling start?" (e.g., minutes, hours, days)    After taking flomax 2. LOCATION: "What part of the leg is swollen?"  "Are both legs swollen or just one leg?"     Bilateral legs from feet to knees left worse then right.  3. SEVERITY: "How bad is the swelling?" (e.g., localized; mild, moderate, severe)  - Localized - small area of swelling localized to one leg  - MILD pedal edema - swelling limited to foot and ankle, pitting edema < 1/4 inch (6 mm) deep, rest and elevation eliminate most or all swelling  - MODERATE edema - swelling of lower leg to knee, pitting edema > 1/4 inch (6 mm) deep, rest and elevation only partially reduce swelling  - SEVERE edema - swelling extends above knee, facial or hand swelling present      Left leg more swollen  than right  , face swelling  4. REDNESS: "Does the swelling look red or infected?"     na 5. PAIN: "Is the swelling painful to touch?" If Yes, ask: "How painful is it?"   (Scale 1-10; mild, moderate or severe)     na 6. FEVER: "Do you have a fever?" If Yes, ask: "What is it, how was it measured, and when did it start?"      na 7. CAUSE: "What do you think is causing the leg swelling?"     flomax 8. MEDICAL HISTORY: "Do you have a history of heart failure, kidney disease, liver failure, or cancer?"     See hx  9. RECURRENT SYMPTOM: "Have you had leg swelling before?" If Yes, ask: "When was the last time?" "What happened that time?"     Na  10. OTHER SYMPTOMS: "Do you have any other symptoms?" (e.g., chest pain, difficulty breathing)       No chest pain no fever no difficulty breathing  11. PREGNANCY: "Is there any chance you are pregnant?" "When was your last menstrual period?"       na  Protocols used: Leg Swelling and Edema-A-AH

## 2021-12-21 NOTE — Telephone Encounter (Signed)
Noted  

## 2021-12-21 NOTE — Telephone Encounter (Signed)
Please advise, patient refused appointment.

## 2021-12-21 NOTE — Telephone Encounter (Signed)
I cannot call him in doxycycline without seeing him

## 2021-12-21 NOTE — Telephone Encounter (Signed)
Needs appointment. If the swelling is not 100% better today he needs to go to the urgent care or ER.

## 2021-12-21 NOTE — Telephone Encounter (Signed)
Patient states he does not want an appointment he did not take Flomax last night and his swelling has gone away. Patient is requesting doxycyline in place of flomax due to still urinating a lot of times at night. Patient states that's the only medicine that has worked for him in the past with no side effects.

## 2021-12-21 NOTE — Telephone Encounter (Signed)
Called patient to inform him that he needs to be seen in the office. Patient states "No I do not want to go to the office. I can send money for the medication. Is that what she needs from me?" I responded with "No sir, she would just like to see you in the office." Patient then states "Well I'm not going and you can't make me. If you keep this up I will cancel all of my appointments." I told patient "Okay, have a good day bye bye" Patient then hung up. FYI

## 2022-01-11 ENCOUNTER — Telehealth: Payer: Self-pay

## 2022-01-11 NOTE — Telephone Encounter (Signed)
Patient called stating he had questions in regard to his upcoming CT scan on 7/24.  Attempted to call patient 2 twice, both times phone would ring for long periods of time and then went to answering service which doesn't leave option to leave voicemail.  Callback #[phone]

## 2022-01-21 ENCOUNTER — Other Ambulatory Visit: Payer: PPO

## 2022-01-28 ENCOUNTER — Ambulatory Visit
Admission: RE | Admit: 2022-01-28 | Discharge: 2022-01-28 | Disposition: A | Discharge status: Home / Self Care | Payer: PPO | Source: Ambulatory Visit | Attending: Oncology | Admitting: Oncology

## 2022-01-28 DIAGNOSIS — K5730 Diverticulosis of large intestine without perforation or abscess without bleeding: Secondary | ICD-10-CM | POA: Diagnosis not present

## 2022-01-28 DIAGNOSIS — N281 Cyst of kidney, acquired: Secondary | ICD-10-CM | POA: Diagnosis not present

## 2022-01-28 DIAGNOSIS — K668 Other specified disorders of peritoneum: Secondary | ICD-10-CM | POA: Insufficient documentation

## 2022-01-28 LAB — POCT I-STAT CREATININE: Creatinine, Ser: 1.1 mg/dL (ref 0.61–1.24)

## 2022-01-28 MED ORDER — IOHEXOL 300 MG/ML  SOLN
100.0000 mL | Freq: Once | INTRAMUSCULAR | Status: AC | PRN
  Administered 2022-01-28: 100 mL via INTRAVENOUS

## 2022-02-04 ENCOUNTER — Other Ambulatory Visit: Payer: PPO

## 2022-02-04 ENCOUNTER — Inpatient Hospital Stay: Payer: PPO | Attending: Oncology | Admitting: Oncology

## 2022-02-04 ENCOUNTER — Encounter: Payer: Self-pay | Admitting: Oncology

## 2022-02-04 VITALS — BP 146/80 | HR 54 | Temp 97.6°F | Resp 16 | Ht 67.0 in | Wt 171.0 lb

## 2022-02-04 DIAGNOSIS — K6389 Other specified diseases of intestine: Secondary | ICD-10-CM | POA: Diagnosis not present

## 2022-02-04 DIAGNOSIS — R1900 Intra-abdominal and pelvic swelling, mass and lump, unspecified site: Secondary | ICD-10-CM | POA: Insufficient documentation

## 2022-02-04 DIAGNOSIS — Z79899 Other long term (current) drug therapy: Secondary | ICD-10-CM | POA: Insufficient documentation

## 2022-02-04 DIAGNOSIS — D3A023 Benign carcinoid tumor of the transverse colon: Secondary | ICD-10-CM

## 2022-02-04 NOTE — Progress Notes (Signed)
Pt gets dizzy when he stands up at times.

## 2022-02-06 NOTE — Progress Notes (Signed)
Hematology/Oncology Consult note [facility]  Telephone:([phone] [phone]  Patient Care Team: Valerie Roys, DO as PCP - General (Family Medicine) Clent Jacks, RN as Oncology Nurse Navigator   Name of the patient: Russell Byrd  [id number]  1946/05/30   Date of visit: 02/06/22  Diagnosis- intra-abdominal calcified mass of unclear etiology  Chief complaint/ Reason for visit-discuss CT scan results and further management  Heme/Onc history: Patient is a 76 year old male with a past medical history significant for hypertension hyperlipidemia and type 2 diabetes who presented to the ER on 05/03/2021 with symptoms of flank pain which prompted a CT scan of the abdomen.  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  Patient referred for further management.  He reports that his left flank pain has moved somewhat but he still has pain in his left groin that radiates to his left thigh which makes it difficult for him to walk   With regards to mesenteric mass patient [facility] as well [facility].  Even in 2015 patient was found to have 4 x 3.4 cm partially calcified soft tissue mass tenting the mid transverse colon and in 2018 this mass was unchanged at around 4 cm as well.   Dotatate PET scan showed large calcified mass in the transverse colon mesentery without radiotracer activity findings not consistent with neuroendocrine tumor.  No other evidence of active well-differentiated neuroendocrine tumor     Interval history-feels occasionally dizzy when he stands up.  Denies other complaints at this time.  Bowel movements are regular.  Denies any abdominal pain nausea vomiting or diarrhea.  ECOG PS- 1 Pain scale- 0   Review of systems- Review of Systems  Constitutional:  Negative for chills, fever, malaise/fatigue and  weight loss.  HENT:  Negative for congestion, ear discharge and nosebleeds.   Eyes:  Negative for blurred vision.  Respiratory:  Negative for cough, hemoptysis, sputum production, shortness of breath and wheezing.   Cardiovascular:  Negative for chest pain, palpitations, orthopnea and claudication.  Gastrointestinal:  Negative for abdominal pain, blood in stool, constipation, diarrhea, heartburn, melena, nausea and vomiting.  Genitourinary:  Negative for dysuria, flank pain, frequency, hematuria and urgency.  Musculoskeletal:  Negative for back pain, joint pain and myalgias.  Skin:  Negative for rash.  Neurological:  Negative for dizziness, tingling, focal weakness, seizures, weakness and headaches.  Endo/Heme/Allergies:  Does not bruise/bleed easily.  Psychiatric/Behavioral:  Negative for depression and suicidal ideas. The patient does not have insomnia.       Allergies  Allergen Reactions  . Flomax [Tamsulosin] Swelling    Pt states swelling to face and swelling to lower extremities  . Prednisone Other (See Comments)    steroids  . Tetracycline     Other reaction(s): Unknown  . Tetracyclines & Related   . Tramadol Other (See Comments)    Keeps him awake     Past Medical History:  Diagnosis Date  . Diabetes mellitus without complication ([place])   . History of kidney stones   . Hyperlipidemia   . Hypertension      Past Surgical History:  Procedure Laterality Date  . KNEE SURGERY Bilateral    arthroscopic    Social History   Socioeconomic History  . Marital status: Married    Spouse name: Not on file  . Number of children:  Not on file  . Years of education: Not on file  . Highest education level: Not on file  Occupational History  . Not on file  Tobacco Use  . Smoking status: Never  . Smokeless tobacco: Current    Types: Chew  Vaping Use  . Vaping Use: Never used  Substance and Sexual Activity  . Alcohol use: Not Currently  . Drug use: No  . Sexual  activity: Not Currently  Other Topics Concern  . Not on file  Social History Narrative  . Not on file   Social Determinants of Health   Financial Resource Strain: Not on file  Food Insecurity: Not on file  Transportation Needs: Not on file  Physical Activity: Not on file  Stress: Not on file  Social Connections: Not on file  Intimate Partner Violence: Not on file    Family History  Problem Relation Age of Onset  . Diabetes Father   . Heart disease Father   . Diabetes Mother      Current Outpatient Medications:  .  Acetaminophen (TYLENOL 8 HOUR PO), Take by mouth daily., Disp: , Rfl:  .  atenolol (TENORMIN) 25 MG tablet, Take 1 tablet (25 mg total) by mouth 2 (two) times daily., Disp: 180 tablet, Rfl: 1 .  atorvastatin (LIPITOR) 20 MG tablet, Take 1 tablet (20 mg total) by mouth daily., Disp: 90 tablet, Rfl: 1 .  benazepril (LOTENSIN) 40 MG tablet, Take 1 tablet (40 mg total) by mouth daily., Disp: 90 tablet, Rfl: 1 .  blood glucose meter kit and supplies KIT, Dispense based on patient and insurance preference; One touch. Use up to three times daily as directed. (FOR ICD-9 250.00, 250.01)., Disp: 1 each, Rfl: 12 .  hydrochlorothiazide (HYDRODIURIL) 25 MG tablet, Take 25 mg by mouth daily., Disp: , Rfl:  .  Ibuprofen 200 MG CAPS, Take by mouth., Disp: , Rfl:  .  lansoprazole (PREVACID) 30 MG capsule, Take 1 capsule (30 mg total) by mouth daily at 12 noon., Disp: 90 capsule, Rfl: 1 .  metFORMIN (GLUCOPHAGE) 500 MG tablet, Take 2 tablets (1,000 mg total) by mouth 2 (two) times daily with a meal., Disp: 360 tablet, Rfl: 1 .  Misc Natural Products (OSTEO BI-FLEX/5-LOXIN ADVANCED PO), Take by mouth., Disp: , Rfl:  .  Multiple Vitamins-Minerals (CENTRUM SILVER ADULT 50+) TABS, Take by mouth daily., Disp: , Rfl:  .  OneTouch Delica Lancets 37J MISC, Apply topically., Disp: , Rfl:  .  ONETOUCH ULTRA test strip, 1 each 2 (two) times daily., Disp: , Rfl:  .  vitamin E 400 UNIT capsule,  Take 400 Units by mouth daily., Disp: , Rfl:  .  Zinc 50 MG CAPS, Take 50 mg by mouth daily., Disp: , Rfl:   Physical exam:  Vitals:   02/04/22 1548  BP: (!) 146/80  Pulse: (!) 54  Resp: 16  Temp: 97.6 F (36.4 C)  TempSrc: Tympanic  Weight: 171 lb (77.6 kg)  Height: _0  (1.702 m)   Physical Exam Constitutional:      General: He is not in acute distress. Cardiovascular:     Rate and Rhythm: Normal rate and regular rhythm.     Heart sounds: Normal heart sounds.  Pulmonary:     Effort: Pulmonary effort is normal.     Breath sounds: Normal breath sounds.  Abdominal:     General: Bowel sounds are normal.     Palpations: Abdomen is soft.  Skin:    General: Skin is  warm and dry.  Neurological:     Mental Status: He is alert and oriented to person, place, and time.        Latest Ref Rng & Units 01/28/2022    3:16 PM  CMP  Creatinine 0.61 - 1.24 mg/dL 1.10       Latest Ref Rng & Units 11/19/2021    3:03 PM  CBC  WBC 3.4 - 10.8 x10E3/uL 10.0   Hemoglobin 13.0 - 17.7 g/dL 13.2   Hematocrit 37.5 - 51.0 % 37.9   Platelets 150 - 450 x10E3/uL 199     No images are attached to the encounter.  CT ABDOMEN PELVIS W CONTRAST  Result Date: 01/28/2022 CLINICAL DATA:  Carcinoid tumor of transverse colon. * Tracking Code: BO * EXAM: CT ABDOMEN AND PELVIS WITH CONTRAST TECHNIQUE: Multidetector CT imaging of the abdomen and pelvis was performed using the standard protocol following bolus administration of intravenous contrast. RADIATION DOSE REDUCTION: This exam was performed according to the departmental dose-optimization program which includes automated exposure control, adjustment of the mA and/or kV according to patient size and/or use of iterative reconstruction technique. CONTRAST:  120m OMNIPAQUE IOHEXOL 300 MG/ML  SOLN COMPARISON:  PET-CT on 05/21/2021 FINDINGS: Lower Chest: No acute findings. Hepatobiliary: Tiny sub-centimeter low attention lesion in the anterior left hepatic  lobe is too small to characterize, but most likely represent a tiny benign cyst. No suspicious liver masses identified. Gallbladder is unremarkable. No evidence of biliary ductal dilatation. Pancreas:  No mass or inflammatory changes. Spleen: Within normal limits in size and appearance. Adrenals/Urinary Tract: 1.5 cm left adrenal mass shows no significant change compared to older exams dating back to 2007, consistent with benign adrenal adenoma or nodular hyperplasia. A few tiny bilateral renal cysts remains stable insert follow-up. No renal masses identified. No evidence of ureteral calculi or hydronephrosis. Unremarkable unopacified urinary bladder. Stomach/Bowel: A densely calcified mass is seen in the area of the transverse mesocolon along the posterior wall of the mid transverse colon. This measures 4.8 x 4.9 cm on image 30/2, without significant change since prior study. A smaller partially calcified mass is seen in the right abdominal mesentery along the medial wall of the ascending colon, this measures 2.6 x 2.0 cm on image 40/2, also stable since prior study. No new or enlarging masses are identified. Diverticulosis is seen mainly involving the descending and sigmoid colon, however there is no evidence of diverticulitis. Vascular/Lymphatic: No pathologically enlarged lymph nodes. No acute vascular findings. Aortic atherosclerotic calcification incidentally noted. Reproductive:  No mass or other significant abnormality. Other:  None. Musculoskeletal:  No suspicious bone lesions identified. IMPRESSION: Stable densely calcified mass in the transverse mesocolon, and stable smaller partially calcified mass in the right abdominal mesentery. No new or progressive disease within the abdomen or pelvis. Colonic diverticulosis. No radiographic evidence of diverticulitis. 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 Aortic Atherosclerosis (ICD10-I70.0). Electronically Signed   By: JMarlaine HindM.D.   On:  01/28/2022 17:23     Assessment and plan- Patient is a 76y.o. male with calcified mesenteric mass of unclear etiology here for routine follow-up     Visit Diagnosis 1. Mesenteric mass      Dr. ARanda Evens MD, MPH [id number]/08/2021 8:40 AM

## 2022-02-15 ENCOUNTER — Telehealth: Payer: Self-pay | Admitting: Oncology

## 2022-02-15 NOTE — Telephone Encounter (Signed)
Patient called and does not want to have a CT scan with any type of contrast. I did let him know that he will not be having the oral contrast but he insists that he doesn't want to risk "getting sick again",

## 2022-02-18 ENCOUNTER — Ambulatory Visit: Payer: PPO | Admitting: Family Medicine

## 2022-02-19 ENCOUNTER — Ambulatory Visit: Payer: PPO | Admitting: Family Medicine

## 2022-05-21 ENCOUNTER — Telehealth: Payer: Self-pay | Admitting: Oncology

## 2022-05-21 NOTE — Telephone Encounter (Signed)
Patient would like to discuss the CT scan. He states he will not do a CT with contrast and would like a new AVS mailed to him with new information after appointment is updated

## 2022-05-22 ENCOUNTER — Other Ambulatory Visit: Payer: Self-pay | Admitting: *Deleted

## 2022-05-22 DIAGNOSIS — K6389 Other specified diseases of intestine: Secondary | ICD-10-CM

## 2022-05-22 NOTE — Telephone Encounter (Signed)
Ok without contrast is fine

## 2022-05-22 NOTE — Telephone Encounter (Addendum)
Pt called to check today to see if the scan without contrast  was resolved and dr Smith Robert said ok  he wanted the scan changed to without contrast and Victorino Dike said that pt did not want a call and wanted a new appt that says no contrast. The appt same date and no contrast. Victorino Dike mailed it 11/15 to him.

## 2022-05-27 ENCOUNTER — Ambulatory Visit (INDEPENDENT_AMBULATORY_CARE_PROVIDER_SITE_OTHER): Payer: PPO | Admitting: Family Medicine

## 2022-05-27 ENCOUNTER — Encounter: Payer: Self-pay | Admitting: Family Medicine

## 2022-05-27 VITALS — BP 196/72 | Temp 98.3°F | Ht 67.0 in | Wt 172.6 lb

## 2022-05-27 DIAGNOSIS — E785 Hyperlipidemia, unspecified: Secondary | ICD-10-CM

## 2022-05-27 DIAGNOSIS — I700 Atherosclerosis of aorta: Secondary | ICD-10-CM | POA: Diagnosis not present

## 2022-05-27 DIAGNOSIS — I152 Hypertension secondary to endocrine disorders: Secondary | ICD-10-CM

## 2022-05-27 DIAGNOSIS — Z0000 Encounter for general adult medical examination without abnormal findings: Secondary | ICD-10-CM

## 2022-05-27 DIAGNOSIS — R350 Frequency of micturition: Secondary | ICD-10-CM | POA: Diagnosis not present

## 2022-05-27 DIAGNOSIS — E1159 Type 2 diabetes mellitus with other circulatory complications: Secondary | ICD-10-CM

## 2022-05-27 DIAGNOSIS — Z23 Encounter for immunization: Secondary | ICD-10-CM | POA: Diagnosis not present

## 2022-05-27 DIAGNOSIS — E1169 Type 2 diabetes mellitus with other specified complication: Secondary | ICD-10-CM | POA: Diagnosis not present

## 2022-05-27 LAB — URINALYSIS, ROUTINE W REFLEX MICROSCOPIC
Bilirubin, UA: NEGATIVE
Glucose, UA: NEGATIVE
Ketones, UA: NEGATIVE
Leukocytes,UA: NEGATIVE
Nitrite, UA: NEGATIVE
Protein,UA: NEGATIVE
RBC, UA: NEGATIVE
Specific Gravity, UA: <1.005 — ABNORMAL LOW (ref 1.005–1.030)
Urobilinogen, Ur: 0.2 mg/dL (ref 0.2–1.0)
pH, UA: 5.5 (ref 5.0–7.5)

## 2022-05-27 LAB — BAYER DCA HB A1C WAIVED: HB A1C (BAYER DCA - WAIVED): 8.1 % — ABNORMAL HIGH (ref 4.8–5.6)

## 2022-05-27 MED ORDER — ATENOLOL 25 MG PO TABS
25.0000 mg | ORAL_TABLET | Freq: Two times a day (BID) | ORAL | 0 refills | Status: DC
Start: 2022-05-27 — End: 2022-06-24

## 2022-05-27 MED ORDER — LANSOPRAZOLE 30 MG PO CPDR: 30.0000 mg | DELAYED_RELEASE_CAPSULE | Freq: Every day | ORAL | 0 refills | Status: DC

## 2022-05-27 MED ORDER — HYDRALAZINE HCL 10 MG PO TABS: 10.0000 mg | ORAL_TABLET | Freq: Three times a day (TID) | ORAL | 0 refills | Status: DC

## 2022-05-27 MED ORDER — METFORMIN HCL 500 MG PO TABS
1000.0000 mg | ORAL_TABLET | Freq: Two times a day (BID) | ORAL | 0 refills | Status: DC
Start: 2022-05-27 — End: 2022-06-24

## 2022-05-27 MED ORDER — ATORVASTATIN CALCIUM 20 MG PO TABS
20.0000 mg | ORAL_TABLET | Freq: Every day | ORAL | 0 refills | Status: DC
Start: 2022-05-27 — End: 2022-06-24

## 2022-05-27 MED ORDER — GLIPIZIDE 5 MG PO TABS: 5.0000 mg | ORAL_TABLET | Freq: Two times a day (BID) | ORAL | 0 refills | Status: DC

## 2022-05-27 MED ORDER — BENAZEPRIL HCL 40 MG PO TABS
40.0000 mg | ORAL_TABLET | Freq: Every day | ORAL | 0 refills | Status: DC
Start: 2022-05-27 — End: 2022-06-24

## 2022-05-27 NOTE — Assessment & Plan Note (Signed)
Under good control on current regimen. Continue current regimen. Continue to monitor. Call with any concerns. Refills given. Labs drawn today.   

## 2022-05-27 NOTE — Patient Instructions (Signed)
Preventative Services:  Health Risk Assessment and Personalized Prevention Plan: Done today Bone Mass Measurements: N/A CVD Screening: Done today Colon Cancer Screening: Up to date Depression Screening: Done today Diabetes Screening: Done today Glaucoma Screening: see your eye doctor Hepatitis B vaccine: N/A Hepatitis C screening: Up to date HIV Screening: up to date Flu Vaccine: done today Lung cancer Screening: N/A Obesity Screening: Done today Pneumonia Vaccines (2): up to date STI Screening: N/A PSA screening: Up to date

## 2022-05-27 NOTE — Progress Notes (Signed)
BP (!) 196/72   Temp 98.3 F (36.8 C) (Oral)   Ht 5' 7" (1.702 m)   Wt 172 lb 9.6 oz (78.3 kg)   SpO2 97%   BMI 27.03 kg/m    Subjective:    Patient ID: Russell Byrd, male    DOB: [birth year], 76 y.o.   MRN: [id number]  HPI: Russell Byrd is a 76 y.o. male presenting on 05/27/2022 for comprehensive medical examination. Current medical complaints include:  HYPERTENSION / HYPERLIPIDEMIA Satisfied with current treatment? no Duration of hypertension: chronic BP monitoring frequency: not checking BP medication side effects: yes Duration of hyperlipidemia: chronic Cholesterol medication side effects: no Cholesterol supplements: none Past cholesterol medications: atorvastatin Medication compliance: poor compliance Aspirin: no Recent stressors: no Recurrent headaches: no Visual changes: no Palpitations: no Dyspnea: no Chest pain: no Lower extremity edema: no Dizzy/lightheaded: no  DIABETES Hypoglycemic episodes:no Polydipsia/polyuria: yes Visual disturbance: no Chest pain: no Paresthesias: no Glucose Monitoring: no  Accucheck frequency: TID Taking Insulin?: no Blood Pressure Monitoring: not checking Retinal Examination: Not up to Date Foot Exam: Up to Date Diabetic Education: Completed Pneumovax: Up to Date Influenza: Up to Date Aspirin: no  He currently lives with: alone Interim Problems from his last visit: no  Functional Status Survey: Is the patient deaf or have difficulty hearing?: No Does the patient have difficulty seeing, even when wearing glasses/contacts?: No Does the patient have difficulty concentrating, remembering, or making decisions?: No Does the patient have difficulty walking or climbing stairs?: Yes Does the patient have difficulty dressing or bathing?: Yes Does the patient have difficulty doing errands alone such as visiting a doctor's office or shopping?: Yes  FALL RISK:    05/27/2022    4:19 PM 11/19/2021    3:13 PM 11/13/2020     4:13 PM 04/17/2020    4:37 PM 01/17/2020    4:05 PM  [place] in the past year? 0 1 1 0 0  Number falls in past yr: 0 1 0 0 0  Injury with Fall? 0 1 0 0 0  Risk for fall due to : History of fall(s) History of fall(s) History of fall(s);Impaired balance/gait    Follow up  Falls evaluation completed Falls evaluation completed Falls evaluation completed Falls evaluation completed    Depression Screen    05/27/2022    4:19 PM 11/13/2020    4:14 PM 10/23/2020    3:50 PM 07/18/2020    3:48 PM 04/17/2020    4:38 PM  Depression screen PHQ 2/9  Decreased Interest 0 0 0 0 0  Down, Depressed, Hopeless 0 0 0 0 0  PHQ - 2 Score 0 0 0 0 0  Altered sleeping 3 0 0 0   Tired, decreased energy 0 0 0 0   Change in appetite 0 0 0 0   Feeling bad or failure about yourself  0 0 0 0   Trouble concentrating 0 0 0 0   Moving slowly or fidgety/restless 0 0 0 0   Suicidal thoughts 0 0 0 0   PHQ-9 Score 3 0 0 0   Difficult doing work/chores Not difficult at all   Not difficult at all     Advanced Directives <no information>  Past Medical History:  Past Medical History:  Diagnosis Date   Diabetes mellitus without complication ([place])    History of kidney stones    Hyperlipidemia    Hypertension     Surgical History:  Past Surgical  History:  Procedure Laterality Date   KNEE SURGERY Bilateral    arthroscopic    Medications:  Current Outpatient Medications on File Prior to Visit  Medication Sig   Acetaminophen (TYLENOL 8 HOUR PO) Take by mouth daily.   blood glucose meter kit and supplies KIT Dispense based on patient and insurance preference; One touch. Use up to three times daily as directed. (FOR ICD-9 250.00, 250.01).   Ibuprofen 200 MG CAPS Take by mouth.   Misc Natural Products (OSTEO BI-FLEX/5-LOXIN ADVANCED PO) Take by mouth.   Multiple Vitamins-Minerals (CENTRUM SILVER ADULT 50+) TABS Take by mouth daily.   OneTouch Delica Lancets 16L MISC Apply topically.   ONETOUCH ULTRA  test strip 1 each 2 (two) times daily.   vitamin E 400 UNIT capsule Take 400 Units by mouth daily.   Zinc 50 MG CAPS Take 50 mg by mouth daily.   No current facility-administered medications on file prior to visit.    Allergies:  Allergies  Allergen Reactions   Amlodipine Swelling   Flomax [Tamsulosin] Swelling    Pt states swelling to face and swelling to lower extremities   Hydrochlorothiazide Other (See Comments)    Urinary frequency   Prednisone Other (See Comments)    steroids   Tetracycline     Other reaction(s): Unknown   Tetracyclines & Related    Tramadol Other (See Comments)    Keeps him awake    Social History:  Social History   Socioeconomic History   Marital status: Married    Spouse name: Not on file   Number of children: Not on file   Years of education: Not on file   Highest education level: Not on file  Occupational History   Not on file  Tobacco Use   Smoking status: Never   Smokeless tobacco: Current    Types: Chew  Vaping Use   Vaping Use: Never used  Substance and Sexual Activity   Alcohol use: Not Currently   Drug use: No   Sexual activity: Not Currently  Other Topics Concern   Not on file  Social History Narrative   Not on file   Social Determinants of Health   Financial Resource Strain: Not on file  Food Insecurity: Not on file  Transportation Needs: Not on file  Physical Activity: Not on file  Stress: Not on file  Social Connections: Not on file  Intimate Partner Violence: Not on file   Social History   Tobacco Use  Smoking Status Never  Smokeless Tobacco Current   Types: Chew   Social History   Substance and Sexual Activity  Alcohol Use Not Currently    Family History:  Family History  Problem Relation Age of Onset   Diabetes Father    Heart disease Father    Diabetes Mother     Past medical history, surgical history, medications, allergies, family history and social history reviewed with patient today and  changes made to appropriate areas of the chart.   Review of Systems  Constitutional: Negative.   Respiratory: Negative.    Cardiovascular: Negative.   Genitourinary:  Positive for frequency. Negative for dysuria, flank pain, hematuria and urgency.  Musculoskeletal: Negative.   Neurological: Negative.   Psychiatric/Behavioral: Negative.     All other ROS negative except what is listed above and in the HPI.      Objective:    BP (!) 196/72   Temp 98.3 F (36.8 C) (Oral)   Ht 5' 7" (1.702 m)  Wt 172 lb 9.6 oz (78.3 kg)   SpO2 97%   BMI 27.03 kg/m   Wt Readings from Last 3 Encounters:  05/27/22 172 lb 9.6 oz (78.3 kg)  02/04/22 171 lb (77.6 kg)  11/19/21 171 lb (77.6 kg)     Physical Exam Vitals and nursing note reviewed.  Constitutional:      General: He is not in acute distress.    Appearance: Normal appearance. He is not ill-appearing, toxic-appearing or diaphoretic.  HENT:     Head: Normocephalic and atraumatic.     Right Ear: External ear normal.     Left Ear: External ear normal.     Nose: Nose normal.     Mouth/Throat:     Mouth: Mucous membranes are moist.     Pharynx: Oropharynx is clear.  Eyes:     General: No scleral icterus.       Right eye: No discharge.        Left eye: No discharge.     Extraocular Movements: Extraocular movements intact.     Conjunctiva/sclera: Conjunctivae normal.     Pupils: Pupils are equal, round, and reactive to light.  Cardiovascular:     Rate and Rhythm: Normal rate and regular rhythm.     Pulses: Normal pulses.     Heart sounds: Normal heart sounds. No murmur heard.    No friction rub. No gallop.  Pulmonary:     Effort: Pulmonary effort is normal. No respiratory distress.     Breath sounds: Normal breath sounds. No stridor. No wheezing, rhonchi or rales.  Chest:     Chest wall: No tenderness.  Musculoskeletal:        General: Normal range of motion.     Cervical back: Normal range of motion and neck supple.  Skin:     General: Skin is warm and dry.     Capillary Refill: Capillary refill takes less than 2 seconds.     Coloration: Skin is not jaundiced or pale.     Findings: No bruising, erythema, lesion or rash.  Neurological:     General: No focal deficit present.     Mental Status: He is alert and oriented to person, place, and time. Mental status is at baseline.  Psychiatric:        Mood and Affect: Mood normal.        Behavior: Behavior normal.        Thought Content: Thought content normal.        Judgment: Judgment normal.        05/27/2022    4:21 PM 11/13/2020    4:15 PM  6CIT Screen  What Year? 0 points 0 points  What month? 0 points 0 points  What time? 0 points 0 points  Count back from 20 0 points 0 points  Months in reverse 0 points 0 points  Repeat phrase 6 points 8 points  Total Score 6 points 8 points    Results for orders placed or performed in visit on 05/27/22  Bayer DCA Hb A1c Waived  Result Value Ref Range   HB A1C (BAYER DCA - WAIVED) 8.1 (H) 4.8 - 5.6 %  Urinalysis, Routine w reflex microscopic  Result Value Ref Range   Specific Gravity, UA <1.005 (L) 1.005 - 1.030   pH, UA 5.5 5.0 - 7.5   Color, UA Yellow Yellow   Appearance Ur Clear Clear   Leukocytes,UA Negative Negative   Protein,UA Negative Negative/Trace   Glucose, UA Negative  Negative   Ketones, UA Negative Negative   RBC, UA Negative Negative   Bilirubin, UA Negative Negative   Urobilinogen, Ur 0.2 0.2 - 1.0 mg/dL   Nitrite, UA Negative Negative      Assessment & Plan:   Problem List Items Addressed This Visit       Cardiovascular and Mediastinum   Hypertension associated with diabetes ([place])    Very out of control. Patient was supposed to return 3-5 months ago for recheck and did not. Restarted HCTZ that was d/c due to side effects. Will start him on hydralazine, but needs close follow up. Will get him back in in 1 month for recheck. Patient did not want to return in 1 month. Stressed the  importance of coming in for follow up. Will send 1 month of his medications to the pharmacy until follow up- then will refill when he comes in for reevaluation.       Relevant Medications   hydrALAZINE (APRESOLINE) 10 MG tablet   atenolol (TENORMIN) 25 MG tablet   atorvastatin (LIPITOR) 20 MG tablet   benazepril (LOTENSIN) 40 MG tablet   metFORMIN (GLUCOPHAGE) 500 MG tablet   glipiZIDE (GLUCOTROL) 5 MG tablet   Other Relevant Orders   Bayer DCA Hb A1c Waived (Completed)   CBC with Differential/Platelet   Comprehensive metabolic panel   Atherosclerosis of aorta ([facility])    Will keep BP, cholesterol and sugar under good control. Continue to monitor. Call with any concerns.       Relevant Medications   hydrALAZINE (APRESOLINE) 10 MG tablet   atenolol (TENORMIN) 25 MG tablet   atorvastatin (LIPITOR) 20 MG tablet   benazepril (LOTENSIN) 40 MG tablet     Endocrine   Diabetes mellitus associated with hormonal etiology ([place])    A1c stable at 8.0- likely contributing to his polyuria. Will start low dose glypizide and recheck 1 month. Follow up 1 month.       Relevant Medications   atorvastatin (LIPITOR) 20 MG tablet   benazepril (LOTENSIN) 40 MG tablet   metFORMIN (GLUCOPHAGE) 500 MG tablet   glipiZIDE (GLUCOTROL) 5 MG tablet   Other Relevant Orders   Bayer DCA Hb A1c Waived (Completed)   CBC with Differential/Platelet   Comprehensive metabolic panel     Other   Hyperlipidemia    Under good control on current regimen. Continue current regimen. Continue to monitor. Call with any concerns. Refills given. Labs drawn today.       Relevant Medications   hydrALAZINE (APRESOLINE) 10 MG tablet   atenolol (TENORMIN) 25 MG tablet   atorvastatin (LIPITOR) 20 MG tablet   benazepril (LOTENSIN) 40 MG tablet   Other Relevant Orders   Bayer DCA Hb A1c Waived (Completed)   CBC with Differential/Platelet   Lipid Panel w/o Chol/HDL Ratio   Comprehensive metabolic panel   Other Visit  Diagnoses     Encounter for annual wellness exam in Medicare patient    -  Primary   Preventative care discussed today as below.   Flu vaccine need       Flu shot given today.   Relevant Orders   Flu Vaccine QUAD High Dose(Fluad) (Completed)   Urinary frequency       [place] multifactorial. Stop HCTZ and start glypizide to help with sugars. UA clear. Follow up 1 month.   Relevant Orders   Urinalysis, Routine w reflex microscopic (Completed)        Preventative Services:  Health Risk Assessment and Personalized Prevention  Plan: Done today Bone Mass Measurements: N/A CVD Screening: Done today Colon Cancer Screening: Up to date Depression Screening: Done today Diabetes Screening: Done today Glaucoma Screening: see your eye doctor Hepatitis B vaccine: N/A Hepatitis C screening: Up to date HIV Screening: up to date Flu Vaccine: done today Lung cancer Screening: N/A Obesity Screening: Done today Pneumonia Vaccines (2): up to date STI Screening: N/A PSA screening: Up to date  LABORATORY TESTING:  Health maintenance labs ordered today as discussed above.   IMMUNIZATIONS:   - Tdap: Tetanus vaccination status reviewed: last tetanus booster within 10 years. - Influenza: Administered today - Pneumovax: Up to date - Prevnar: Up to date - Zostavax vaccine: Refused  SCREENING: - Colonoscopy: Not applicable  Discussed with patient purpose of the colonoscopy is to detect colon cancer at curable precancerous or early stages   PATIENT COUNSELING:    Sexuality: Discussed sexually transmitted diseases, partner selection, use of condoms, avoidance of unintended pregnancy  and contraceptive alternatives.   Advised to avoid cigarette smoking.  I discussed with the patient that most people either abstain from alcohol or drink within safe limits (<=14/week and <=4 drinks/occasion for males, <=7/weeks and <= 3 drinks/occasion for females) and that the risk for alcohol disorders and other  health effects rises proportionally with the number of drinks per week and how often a drinker exceeds daily limits.  Discussed cessation/primary prevention of drug use and availability of treatment for abuse.   Diet: Encouraged to adjust caloric intake to maintain  or achieve ideal body weight, to reduce intake of dietary saturated fat and total fat, to limit sodium intake by avoiding high sodium foods and not adding table salt, and to maintain adequate dietary potassium and calcium preferably from fresh fruits, vegetables, and low-fat dairy products.    stressed the importance of regular exercise  Injury prevention: Discussed safety belts, safety helmets, smoke detector, smoking near bedding or upholstery.   Dental health: Discussed importance of regular tooth brushing, flossing, and dental visits.   Follow up plan: NEXT PREVENTATIVE PHYSICAL DUE IN 1 YEAR. Return in about 4 weeks (around 06/24/2022).

## 2022-05-27 NOTE — Assessment & Plan Note (Signed)
Will keep BP, cholesterol and sugar under good control. Continue to monitor. Call with any concerns.

## 2022-05-27 NOTE — Assessment & Plan Note (Signed)
A1c stable at 8.0- likely contributing to his polyuria. Will start low dose glypizide and recheck 1 month. Follow up 1 month.

## 2022-05-27 NOTE — Assessment & Plan Note (Signed)
Very out of control. Patient was supposed to return 3-5 months ago for recheck and did not. Restarted HCTZ that was d/c due to side effects. Will start him on hydralazine, but needs close follow up. Will get him back in in 1 month for recheck. Patient did not want to return in 1 month. Stressed the importance of coming in for follow up. Will send 1 month of his medications to the pharmacy until follow up- then will refill when he comes in for reevaluation.

## 2022-05-28 ENCOUNTER — Encounter: Payer: Self-pay | Admitting: Family Medicine

## 2022-05-28 LAB — COMPREHENSIVE METABOLIC PANEL
ALT: 12 IU/L (ref 0–44)
AST: 11 IU/L (ref 0–40)
Albumin/Globulin Ratio: 1.6 (ref 1.2–2.2)
Albumin: 4.6 g/dL (ref 3.8–4.8)
Alkaline Phosphatase: 97 IU/L (ref 44–121)
BUN/Creatinine Ratio: 18 (ref 10–24)
BUN: 22 mg/dL (ref 8–27)
Bilirubin Total: 0.5 mg/dL (ref 0.0–1.2)
CO2: 23 mmol/L (ref 20–29)
Calcium: 9.7 mg/dL (ref 8.6–10.2)
Chloride: 99 mmol/L (ref 96–106)
Creatinine, Ser: 1.19 mg/dL (ref 0.76–1.27)
Globulin, Total: 2.8 g/dL (ref 1.5–4.5)
Glucose: 164 mg/dL — ABNORMAL HIGH (ref 70–99)
Potassium: 5.1 mmol/L (ref 3.5–5.2)
Sodium: 137 mmol/L (ref 134–144)
Total Protein: 7.4 g/dL (ref 6.0–8.5)
eGFR: 63 mL/min/{1.73_m2} (ref >59)

## 2022-05-28 LAB — CBC WITH DIFFERENTIAL/PLATELET
Basophils Absolute: 0 10*3/uL (ref 0.0–0.2)
Basos: 0 %
EOS (ABSOLUTE): 0.1 10*3/uL (ref 0.0–0.4)
Eos: 1 %
Hematocrit: 37.9 % (ref 37.5–51.0)
Hemoglobin: 12.7 g/dL — ABNORMAL LOW (ref 13.0–17.7)
Immature Grans (Abs): 0.1 10*3/uL (ref 0.0–0.1)
Immature Granulocytes: 1 %
Lymphocytes Absolute: 1.6 10*3/uL (ref 0.7–3.1)
Lymphs: 15 %
MCH: 30.2 pg (ref 26.6–33.0)
MCHC: 33.5 g/dL (ref 31.5–35.7)
MCV: 90 fL (ref 79–97)
Monocytes Absolute: 0.7 10*3/uL (ref 0.1–0.9)
Monocytes: 7 %
Neutrophils Absolute: 8 10*3/uL — ABNORMAL HIGH (ref 1.4–7.0)
Neutrophils: 76 %
Platelets: 220 10*3/uL (ref 150–450)
RBC: 4.2 x10E6/uL (ref 4.14–5.80)
RDW: 12.7 % (ref 11.6–15.4)
WBC: 10.5 10*3/uL (ref 3.4–10.8)

## 2022-05-28 LAB — LIPID PANEL W/O CHOL/HDL RATIO
Cholesterol, Total: 135 mg/dL (ref 100–199)
HDL: 35 mg/dL — ABNORMAL LOW (ref >39)
LDL Chol Calc (NIH): 76 mg/dL (ref 0–99)
Triglycerides: 133 mg/dL (ref 0–149)
VLDL Cholesterol Cal: 24 mg/dL (ref 5–40)

## 2022-06-03 ENCOUNTER — Telehealth: Payer: Self-pay

## 2022-06-03 NOTE — Telephone Encounter (Unsigned)
Copied from [place] [id number]. Topic: General - Other >> Jun 03, 2022  4:08 PM [place] B wrote: Reason for CRM: Patient called in requesting copy of lab results to be mailed to him.

## 2022-06-04 NOTE — Telephone Encounter (Signed)
Lab results were mailed to patient per patient's request.

## 2022-06-18 ENCOUNTER — Other Ambulatory Visit: Payer: Self-pay | Admitting: Family Medicine

## 2022-06-18 NOTE — Telephone Encounter (Signed)
Requested medication (s) are due for refill today: No  Requested medication (s) are on the active medication list: Yes  Last refill:    Future visit scheduled: Yes  Notes to clinic:  Pharmacy requests 90 day supply.    Requested Prescriptions  Pending Prescriptions Disp Refills   metFORMIN (GLUCOPHAGE) 500 MG tablet [Pharmacy Med Name: METFORMIN HCL 500 MG TABLET] 360 tablet 1    Sig: Take 2 tablets (1,000 mg total) by mouth 2 (two) times daily with a meal.     Endocrinology:  Diabetes - Biguanides Failed - 06/18/2022 11:31 AM      Failed - HBA1C is between 0 and 7.9 and within 180 days    HB A1C (BAYER DCA - WAIVED)  Date Value Ref Range Status  05/27/2022 8.1 (H) 4.8 - 5.6 % Final    Comment:             Prediabetes: 5.7 - 6.4          Diabetes: >6.4          Glycemic control for adults with diabetes: <7.0          Failed - B12 Level in normal range and within 720 days    No results found for: "VITAMINB12"       Passed - Cr in normal range and within 360 days    Creatinine  Date Value Ref Range Status  11/13/2020 80.8 20.0 - 300.0 mg/dL Final   Creatinine, Ser  Date Value Ref Range Status  05/27/2022 1.19 0.76 - 1.27 mg/dL Final         Passed - eGFR in normal range and within 360 days    GFR calc Af Amer  Date Value Ref Range Status  04/17/2020 73 >59 mL/min/1.73 Final    Comment:    **[facility] currently reports eGFR in compliance with the current**   recommendations of the [employer]. [facility] will   update reporting as new guidelines are published from the NKF-ASN   Task force.    GFR, Estimated  Date Value Ref Range Status  05/02/2021 >60 >60 mL/min Final    Comment:    (NOTE) Calculated using the CKD-EPI Creatinine Equation (2021)    eGFR  Date Value Ref Range Status  05/27/2022 63 >59 mL/min/1.73 Final         Passed - Valid encounter within last 6 months    Recent Outpatient Visits           3 weeks ago Encounter for annual  wellness exam in Medicare patient   [place], Megan P, DO   7 months ago Routine general medical examination at a health care facility   [facility], [place], DO   1 year ago Sciatica, left side   [place], [place], DO   1 year ago [place] Vigg, Avanti, MD   1 year ago Diabetes mellitus associated with hormonal etiology [facility])   [place], [place], DO       Future Appointments             In 6 days Johnson, Megan P, DO [place], [facility]            Passed - CBC within normal limits and completed in the last 12 months    WBC  Date Value Ref Range Status  05/27/2022 10.5 3.4 - 10.8 x10E3/uL Final  05/02/2021 12.8 (  H) 4.0 - 10.5 K/uL Final   RBC  Date Value Ref Range Status  05/27/2022 4.20 4.14 - 5.80 x10E6/uL Final  05/02/2021 4.55 4.22 - 5.81 MIL/uL Final   Hemoglobin  Date Value Ref Range Status  05/27/2022 12.7 (L) 13.0 - 17.7 g/dL Final   Hematocrit  Date Value Ref Range Status  05/27/2022 37.9 37.5 - 51.0 % Final   MCHC  Date Value Ref Range Status  05/27/2022 33.5 31.5 - 35.7 g/dL Final  05/02/2021 35.7 30.0 - 36.0 g/dL Final   [facility]  Date Value Ref Range Status  05/27/2022 30.2 26.6 - 33.0 pg Final  05/02/2021 31.2 26.0 - 34.0 pg Final   MCV  Date Value Ref Range Status  05/27/2022 90 79 - 97 fL Final   No results found for: "PLTCOUNTKUC", "LABPLAT", "POCPLA" RDW  Date Value Ref Range Status  05/27/2022 12.7 11.6 - 15.4 % Final          glipiZIDE (GLUCOTROL) 5 MG tablet [Pharmacy Med Name: GLIPIZIDE 5 MG TABLET] 180 tablet 1    Sig: TAKE 1 TABLET (5 MG TOTAL) BY MOUTH TWICE A DAY BEFORE MEALS     Endocrinology:  Diabetes - Sulfonylureas Failed - 06/18/2022 11:31 AM      Failed - HBA1C is between 0 and 7.9 and within 180 days    HB A1C (BAYER DCA - WAIVED)  Date Value Ref Range Status  05/27/2022 8.1 (H) 4.8 - 5.6 %  Final    Comment:             Prediabetes: 5.7 - 6.4          Diabetes: >6.4          Glycemic control for adults with diabetes: <7.0          Passed - Cr in normal range and within 360 days    Creatinine  Date Value Ref Range Status  11/13/2020 80.8 20.0 - 300.0 mg/dL Final   Creatinine, Ser  Date Value Ref Range Status  05/27/2022 1.19 0.76 - 1.27 mg/dL Final         Passed - Valid encounter within last 6 months    Recent Outpatient Visits           3 weeks ago Encounter for annual wellness exam in Medicare patient   [place], Megan P, DO   7 months ago Routine general medical examination at a health care facility   [place], [place], DO   1 year ago Sciatica, left side   [place], [place], DO   1 year ago [place] Vigg, Avanti, MD   1 year ago Diabetes mellitus associated with hormonal etiology ([place])   [place], [place] P, DO       Future Appointments             In 6 days Johnson, Megan P, DO [place], [facility]             atorvastatin (LIPITOR) 20 MG tablet [Pharmacy Med Name: ATORVASTATIN 20 MG TABLET] 90 tablet 1    Sig: TAKE 1 TABLET BY MOUTH EVERY DAY     Cardiovascular:  Antilipid - Statins Failed - 06/18/2022 11:31 AM      Failed - Lipid Panel in normal range within the last 12 months    Cholesterol, Total  Date Value Ref Range Status  05/27/2022 135 100 - 199  mg/dL Final   Cholesterol [place], [place]  Date Value Ref Range Status  04/20/2018 127 <200 mg/dL Final    Comment:                            Desirable                <200                         Borderline High      200- 239                         High                     >239    LDL Chol Calc (NIH)  Date Value Ref Range Status  05/27/2022 76 0 - 99 mg/dL Final   HDL  Date Value Ref Range Status  05/27/2022 35 (L) >39 mg/dL Final   Triglycerides   Date Value Ref Range Status  05/27/2022 133 0 - 149 mg/dL Final   Triglycerides Piccolo,Waived  Date Value Ref Range Status  04/20/2018 156 (H) <150 mg/dL Final    Comment:                            Normal                   <150                         Borderline High     150 - 199                         High                200 - 499                         Very High                >499          Passed - Patient is not pregnant      Passed - Valid encounter within last 12 months    Recent Outpatient Visits           3 weeks ago Encounter for annual wellness exam in Medicare patient   [place], Megan P, DO   7 months ago Routine general medical examination at a health care facility   [place], [place], DO   1 year ago Sciatica, left side   [place], [place], DO   1 year ago [place] Vigg, Avanti, MD   1 year ago Diabetes mellitus associated with hormonal etiology ([place])   [place], Megan P, DO       Future Appointments             In 6 days Johnson, Megan P, DO [place], [facility]             benazepril (LOTENSIN) 40 MG tablet [Pharmacy Med Name: BENAZEPRIL HCL 40 MG TABLET] 90 tablet 1    Sig: TAKE 1 TABLET BY MOUTH EVERY DAY  Cardiovascular:  ACE Inhibitors Failed - 06/18/2022 11:31 AM      Failed - Last BP in normal range    BP Readings from Last 1 Encounters:  05/27/22 (!) 196/72         Passed - Cr in normal range and within 180 days    Creatinine  Date Value Ref Range Status  11/13/2020 80.8 20.0 - 300.0 mg/dL Final   Creatinine, Ser  Date Value Ref Range Status  05/27/2022 1.19 0.76 - 1.27 mg/dL Final         Passed - K in normal range and within 180 days    Potassium  Date Value Ref Range Status  05/27/2022 5.1 3.5 - 5.2 mmol/L Final         Passed - Patient is not pregnant      Passed - Valid  encounter within last 6 months    Recent Outpatient Visits           3 weeks ago Encounter for annual wellness exam in Medicare patient   [employer], Megan P, DO   7 months ago Routine general medical examination at a health care facility   [place], [place], DO   1 year ago Sciatica, left side   [place], [place], DO   1 year ago [place] Vigg, Avanti, MD   1 year ago Diabetes mellitus associated with hormonal etiology ([place])   [place], Megan P, DO       Future Appointments             In 6 days Johnson, Megan P, DO [place], [facility]             atenolol (TENORMIN) 25 MG tablet [Pharmacy Med Name: ATENOLOL 25 MG TABLET] 180 tablet 1    Sig: TAKE 1 TABLET BY MOUTH TWICE A DAY     Cardiovascular: Beta Blockers 2 Failed - 06/18/2022 11:31 AM      Failed - Last BP in normal range    BP Readings from Last 1 Encounters:  05/27/22 (!) 196/72         Passed - Cr in normal range and within 360 days    Creatinine  Date Value Ref Range Status  11/13/2020 80.8 20.0 - 300.0 mg/dL Final   Creatinine, Ser  Date Value Ref Range Status  05/27/2022 1.19 0.76 - 1.27 mg/dL Final         Passed - Last Heart Rate in normal range    Pulse Readings from Last 1 Encounters:  02/04/22 (!) 54         Passed - Valid encounter within last 6 months    Recent Outpatient Visits           3 weeks ago Encounter for annual wellness exam in Medicare patient   [employer], [place], DO   7 months ago Routine general medical examination at a health care facility   [place], [place], DO   1 year ago Sciatica, left side   [place], [place], DO   1 year ago [place] Vigg, Avanti, MD   1 year ago Diabetes mellitus associated with hormonal etiology [facility])   [place], Megan P, DO       Future Appointments  In 6 days Johnson, Megan P, DO [place], [facility]             lansoprazole (PREVACID) 30 MG capsule [Pharmacy Med Name: LANSOPRAZOLE DR 30 MG CAPSULE] 90 capsule 1    Sig: Take 1 capsule (30 mg total) by mouth daily at 12 noon.     Gastroenterology: Proton Pump Inhibitors 2 Passed - 06/18/2022 11:31 AM      Passed - ALT in normal range and within 360 days    ALT  Date Value Ref Range Status  05/27/2022 12 0 - 44 IU/L Final   ALT (SGPT) Piccolo, Waived  Date Value Ref Range Status  04/20/2018 32 10 - 47 U/L Final         Passed - AST in normal range and within 360 days    AST  Date Value Ref Range Status  05/27/2022 11 0 - 40 IU/L Final   AST (SGOT) Piccolo, Waived  Date Value Ref Range Status  04/20/2018 30 11 - 38 U/L Final         Passed - Valid encounter within last 12 months    Recent Outpatient Visits           3 weeks ago Encounter for annual wellness exam in Medicare patient   [place], Megan P, DO   7 months ago Routine general medical examination at a health care facility   [place], [place], DO   1 year ago Sciatica, left side   [place], [place], DO   1 year ago [place] Vigg, Avanti, MD   1 year ago Diabetes mellitus associated with hormonal etiology [facility])   [place], [place], DO       Future Appointments             In 6 days [place] Emery, Barb Merino, DO [employer], [facility]

## 2022-06-24 ENCOUNTER — Ambulatory Visit (INDEPENDENT_AMBULATORY_CARE_PROVIDER_SITE_OTHER): Payer: PPO | Admitting: Family Medicine

## 2022-06-24 ENCOUNTER — Encounter: Payer: Self-pay | Admitting: Family Medicine

## 2022-06-24 VITALS — BP 230/84 | HR 60 | Temp 98.2°F | Ht 67.0 in | Wt 175.6 lb

## 2022-06-24 DIAGNOSIS — E1169 Type 2 diabetes mellitus with other specified complication: Secondary | ICD-10-CM

## 2022-06-24 DIAGNOSIS — E1159 Type 2 diabetes mellitus with other circulatory complications: Secondary | ICD-10-CM | POA: Diagnosis not present

## 2022-06-24 DIAGNOSIS — I152 Hypertension secondary to endocrine disorders: Secondary | ICD-10-CM

## 2022-06-24 MED ORDER — METFORMIN HCL 500 MG PO TABS: 1000.0000 mg | ORAL_TABLET | Freq: Two times a day (BID) | ORAL | 0 refills | Status: AC

## 2022-06-24 MED ORDER — BENAZEPRIL HCL 40 MG PO TABS: 40.0000 mg | ORAL_TABLET | Freq: Every day | ORAL | 0 refills | Status: AC

## 2022-06-24 MED ORDER — ATORVASTATIN CALCIUM 20 MG PO TABS: 20.0000 mg | ORAL_TABLET | Freq: Every day | ORAL | 1 refills | Status: AC

## 2022-06-24 MED ORDER — HYDRALAZINE HCL 10 MG PO TABS
5.0000 mg | ORAL_TABLET | Freq: Three times a day (TID) | ORAL | 0 refills | Status: DC
Start: 2022-06-24 — End: 2022-06-26

## 2022-06-24 MED ORDER — ATENOLOL 25 MG PO TABS: 25.0000 mg | ORAL_TABLET | Freq: Two times a day (BID) | ORAL | 1 refills | Status: AC

## 2022-06-24 MED ORDER — LANSOPRAZOLE 30 MG PO CPDR: 30.0000 mg | DELAYED_RELEASE_CAPSULE | Freq: Every day | ORAL | 0 refills | Status: AC

## 2022-06-24 NOTE — Assessment & Plan Note (Addendum)
Stopped his glipizide. Denies any decreased in urinary frequency while on it. Denies any decrease in urinary frequency since stopping it or HCTZ. Due for a recheck on his A1c in 2 months. Follow up 2 months.

## 2022-06-24 NOTE — Assessment & Plan Note (Signed)
Stopped his medication about 4 days ago due to "feeling his pulse in his wrist" and some SOB. Unclear if this is due to medication or high blood pressure. Will restart hydralazine '5mg'$ . Discussed the importance of close follow up. Advised him that I would like to see him back on Friday for follow up on his blood pressure considering how high it is. He told me he would not come back until May. I informed him of the risks of uncontrolled blood pressure including stroke, heart attack and death if his blood pressure does not come under better control. Offered home health to come out and check BP at home or getting him set up with a program like doctors making house calls. He refused and then refused to talk to me. He wants to go back on his previous medications- but they were stopped due to side effects (amlodipine- swelling, HCTZ- increased urinary frequency). Patient is on max dose benazepril and HR is 60 on atenolol. May benefit from imdur or clonidine- but will get him into Advanced HTN clinic for assistance in 6th/7th line medications. Referral generated today.

## 2022-06-24 NOTE — Progress Notes (Signed)
BP (!) 230/84   Pulse 60   Temp 98.2 F (36.8 C) (Oral)   Ht _0  (1.702 m)   Wt 175 lb 9.6 oz (79.7 kg)   SpO2 100%   BMI 27.50 kg/m    Subjective:    Patient ID: Russell Byrd, male    DOB: August 20, 1945, 76 y.o.   MRN: [id number]  HPI: Russell Byrd is a 76 y.o. male  Chief Complaint  Patient presents with   Hypertension   Diabetes    Patient declines having a recent Diabetic Eye Exam.    Medication Problem    Patient says he stopped taking two medications that he was prescribed at his last visit. Patient is not quite sure the names of medication. Patient says he could see his pulse beating on his wrist and says he was getting winded when he was putting on his shoes. Patient last dose was Friday.    Leg Swelling   HYPERTENSION  Hypertension status: controlled  Satisfied with current treatment? no Duration of hypertension: chronic BP monitoring frequency:  not checking BP medication side effects:  unclear- stopped his hydralazine 4 days ago due to "seeing his heart beat in his wrist" and feeling "winded" when putting on his shoes. Did not check BP at the time Medication compliance: poor compliance Previous BP meds: hydralazine, amlodipine, atenolol, benazepril, HCTZ, quinapril, ramipril, spironalactone, tekturna, valsartan, valsartan-HCTZ, and verapamil Aspirin: no Recurrent headaches: no Visual changes: no Palpitations: no Dyspnea: yes Chest pain: no Lower extremity edema: no Dizzy/lightheaded: no  DIABETES Hypoglycemic episodes:no Polydipsia/polyuria: no Visual disturbance: no Chest pain: no Paresthesias: no Glucose Monitoring: no Taking Insulin?: no Blood Pressure Monitoring: not checking Retinal Examination: Not up to Date Foot Exam: Up to Date Diabetic Education: Completed Pneumovax: Up to Date Influenza: Up to Date Aspirin: no  Relevant past medical, surgical, family and social history reviewed and updated as indicated. Interim medical history  since our last visit reviewed. Allergies and medications reviewed and updated.  Review of Systems  Constitutional: Negative.   Cardiovascular: Negative.   Gastrointestinal: Negative.   Genitourinary:  Positive for frequency. Negative for decreased urine volume, difficulty urinating, dysuria, enuresis, flank pain, genital sores, hematuria, penile discharge, penile pain, penile swelling, scrotal swelling, testicular pain and urgency.  Neurological: Negative.   Psychiatric/Behavioral: Negative.      Per HPI unless specifically indicated above     Objective:    BP (!) 230/84   Pulse 60   Temp 98.2 F (36.8 C) (Oral)   Ht _1  (1.702 m)   Wt 175 lb 9.6 oz (79.7 kg)   SpO2 100%   BMI 27.50 kg/m   Wt Readings from Last 3 Encounters:  06/24/22 175 lb 9.6 oz (79.7 kg)  05/27/22 172 lb 9.6 oz (78.3 kg)  02/04/22 171 lb (77.6 kg)    Physical Exam Vitals and nursing note reviewed.  Constitutional:      General: He is not in acute distress.    Appearance: Normal appearance. He is not ill-appearing, toxic-appearing or diaphoretic.  HENT:     Head: Normocephalic and atraumatic.     Right Ear: External ear normal.     Left Ear: External ear normal.     Nose: Nose normal.     Mouth/Throat:     Mouth: Mucous membranes are moist.     Pharynx: Oropharynx is clear.  Eyes:     General: No scleral icterus.       Right eye: No  discharge.        Left eye: No discharge.     Extraocular Movements: Extraocular movements intact.     Conjunctiva/sclera: Conjunctivae normal.     Pupils: Pupils are equal, round, and reactive to light.  Cardiovascular:     Rate and Rhythm: Normal rate and regular rhythm.     Pulses: Normal pulses.     Heart sounds: Normal heart sounds. No murmur heard.    No friction rub. No gallop.  Pulmonary:     Effort: Pulmonary effort is normal. No respiratory distress.     Breath sounds: Normal breath sounds. No stridor. No wheezing, rhonchi or rales.  Chest:      Chest wall: No tenderness.  Musculoskeletal:        General: Normal range of motion.     Cervical back: Normal range of motion and neck supple.  Skin:    General: Skin is warm and dry.     Capillary Refill: Capillary refill takes less than 2 seconds.     Coloration: Skin is not jaundiced or pale.     Findings: No bruising, erythema, lesion or rash.  Neurological:     General: No focal deficit present.     Mental Status: He is alert and oriented to person, place, and time. Mental status is at baseline.  Psychiatric:        Mood and Affect: Mood normal. Affect is angry.        Speech: Speech normal.        Behavior: Behavior is uncooperative.        Thought Content: Thought content normal.        Cognition and Memory: Cognition normal.        Judgment: Judgment normal.     Results for orders placed or performed in visit on 05/27/22  Bayer DCA Hb A1c Waived  Result Value Ref Range   HB A1C (BAYER DCA - WAIVED) 8.1 (H) 4.8 - 5.6 %  CBC with Differential/Platelet  Result Value Ref Range   WBC 10.5 3.4 - 10.8 x10E3/uL   RBC 4.20 4.14 - 5.80 x10E6/uL   Hemoglobin 12.7 (L) 13.0 - 17.7 g/dL   Hematocrit 37.9 37.5 - 51.0 %   MCV 90 79 - 97 fL   MCH 30.2 26.6 - 33.0 pg   MCHC 33.5 31.5 - 35.7 g/dL   RDW 12.7 11.6 - 15.4 %   Platelets 220 150 - 450 x10E3/uL   Neutrophils 76 Not Estab. %   Lymphs 15 Not Estab. %   Monocytes 7 Not Estab. %   Eos 1 Not Estab. %   Basos 0 Not Estab. %   Neutrophils Absolute 8.0 (H) 1.4 - 7.0 x10E3/uL   Lymphocytes Absolute 1.6 0.7 - 3.1 x10E3/uL   Monocytes Absolute 0.7 0.1 - 0.9 x10E3/uL   EOS (ABSOLUTE) 0.1 0.0 - 0.4 x10E3/uL   Basophils Absolute 0.0 0.0 - 0.2 x10E3/uL   Immature Granulocytes 1 Not Estab. %   Immature Grans (Abs) 0.1 0.0 - 0.1 x10E3/uL  Lipid Panel w/o Chol/HDL Ratio  Result Value Ref Range   Cholesterol, Total 135 100 - 199 mg/dL   Triglycerides 133 0 - 149 mg/dL   HDL 35 (L) >39 mg/dL   VLDL Cholesterol Cal 24 5 - 40 mg/dL    LDL Chol Calc (NIH) 76 0 - 99 mg/dL  Comprehensive metabolic panel  Result Value Ref Range   Glucose 164 (H) 70 - 99 mg/dL   BUN 22 8 -  27 mg/dL   Creatinine, Ser 1.19 0.76 - 1.27 mg/dL   eGFR 63 >59 mL/min/1.73   BUN/Creatinine Ratio 18 10 - 24   Sodium 137 134 - 144 mmol/L   Potassium 5.1 3.5 - 5.2 mmol/L   Chloride 99 96 - 106 mmol/L   CO2 23 20 - 29 mmol/L   Calcium 9.7 8.6 - 10.2 mg/dL   Total Protein 7.4 6.0 - 8.5 g/dL   Albumin 4.6 3.8 - 4.8 g/dL   Globulin, Total 2.8 1.5 - 4.5 g/dL   Albumin/Globulin Ratio 1.6 1.2 - 2.2   Bilirubin Total 0.5 0.0 - 1.2 mg/dL   Alkaline Phosphatase 97 44 - 121 IU/L   AST 11 0 - 40 IU/L   ALT 12 0 - 44 IU/L  Urinalysis, Routine w reflex microscopic  Result Value Ref Range   Specific Gravity, UA <1.005 (L) 1.005 - 1.030   pH, UA 5.5 5.0 - 7.5   Color, UA Yellow Yellow   Appearance Ur Clear Clear   Leukocytes,UA Negative Negative   Protein,UA Negative Negative/Trace   Glucose, UA Negative Negative   Ketones, UA Negative Negative   RBC, UA Negative Negative   Bilirubin, UA Negative Negative   Urobilinogen, Ur 0.2 0.2 - 1.0 mg/dL   Nitrite, UA Negative Negative      Assessment & Plan:   Problem List Items Addressed This Visit       Cardiovascular and Mediastinum   Hypertension associated with diabetes ([place]) - Primary    Stopped his medication about 4 days ago due to "feeling his pulse in his wrist" and some SOB. Unclear if this is due to medication or high blood pressure. Will restart hydralazine 18m. Discussed the importance of close follow up. Advised him that I would like to see him back on Friday for follow up on his blood pressure considering how high it is. He told me he would not come back until May. I informed him of the risks of uncontrolled blood pressure including stroke, heart attack and death if his blood pressure does not come under better control. Offered home health to come out and check BP at home or getting him set up  with a program like doctors making house calls. He refused and then refused to talk to me. He wants to go back on his previous medications- but they were stopped due to side effects (amlodipine- swelling, HCTZ- increased urinary frequency). Patient is on max dose benazepril and HR is 60 on atenolol. May benefit from imdur or clonidine- but will get him into Advanced HTN clinic for assistance in 6th/7th line medications. Referral generated today.      Relevant Medications   hydrALAZINE (APRESOLINE) 10 MG tablet   atenolol (TENORMIN) 25 MG tablet   atorvastatin (LIPITOR) 20 MG tablet   benazepril (LOTENSIN) 40 MG tablet   metFORMIN (GLUCOPHAGE) 500 MG tablet   Other Relevant Orders   Ambulatory referral to [facility] Hypertension [facility] - [facility]     Endocrine   Diabetes mellitus associated with hormonal etiology ([place]    Stopped his glipizide. Denies any decreased in urinary frequency while on it. Denies any decrease in urinary frequency since stopping it or HCTZ. Due for a recheck on his A1c in 2 months. Follow up 2 months.      Relevant Medications   atorvastatin (LIPITOR) 20 MG tablet   benazepril (LOTENSIN) 40 MG tablet   metFORMIN (GLUCOPHAGE) 500 MG tablet     Follow up plan: Return  Recommended follow up in 5 days. He refused and said 5 months. Will do 2 months.Marland Kitchen

## 2022-06-26 ENCOUNTER — Other Ambulatory Visit: Payer: Self-pay | Admitting: Family Medicine

## 2022-06-26 NOTE — Telephone Encounter (Unsigned)
Copied from [place] [id number]. Topic: General - Other >> Jun 26, 2022  3:50 PM Everette C wrote: Reason for CRM: Medication Refill - Medication: hydrALAZINE (APRESOLINE) 10 MG tablet [[id number]] - 135 tablets   Has the patient contacted their pharmacy? Yes.  The patient's pharmacy has made contact on their behalf  (Agent: If no, request that the patient contact the pharmacy for the refill. If patient does not wish to contact the pharmacy document the reason why and proceed with request.) (Agent: If yes, when and what did the pharmacy advise?)  Preferred Pharmacy (with phone number or street name): [facility]) - [address], [place] [address] Phone: [phone] Fax: [phone] Hours: Not open 24 hours   Has the patient been seen for an appointment in the last year OR does the patient have an upcoming appointment? Yes.    Agent: Please be advised that RX refills may take up to 3 business days. We ask that you follow-up with your pharmacy.

## 2022-06-27 ENCOUNTER — Telehealth: Payer: Self-pay | Admitting: Family Medicine

## 2022-06-27 MED ORDER — HYDRALAZINE HCL 10 MG PO TABS: 5.0000 mg | ORAL_TABLET | Freq: Three times a day (TID) | ORAL | 0 refills | Status: AC

## 2022-06-27 NOTE — Telephone Encounter (Signed)
Lisa from [employer], asking if Hydroxiline 5 mg, is only gonna be temporary or can it be done for 90 days to be covered under insurance?

## 2022-06-27 NOTE — Telephone Encounter (Signed)
Resending d/t pharmacy not receiving.   Requested Prescriptions  Pending Prescriptions Disp Refills   hydrALAZINE (APRESOLINE) 10 MG tablet 45 tablet 0    Sig: Take 0.5 tablets (5 mg total) by mouth 3 (three) times daily.     Cardiovascular:  Vasodilators Failed - 06/26/2022  5:24 PM      Failed - HGB in normal range and within 360 days    Hemoglobin  Date Value Ref Range Status  05/27/2022 12.7 (L) 13.0 - 17.7 g/dL Final         Failed - ANA Screen, Ifa, Serum in normal range and within 360 days    No results found for: "ANA", "ANATITER", "LABANTI"       Failed - Last BP in normal range    BP Readings from Last 1 Encounters:  06/24/22 (!) 230/84         Passed - HCT in normal range and within 360 days    Hematocrit  Date Value Ref Range Status  05/27/2022 37.9 37.5 - 51.0 % Final         Passed - RBC in normal range and within 360 days    RBC  Date Value Ref Range Status  05/27/2022 4.20 4.14 - 5.80 x10E6/uL Final  05/02/2021 4.55 4.22 - 5.81 MIL/uL Final         Passed - WBC in normal range and within 360 days    WBC  Date Value Ref Range Status  05/27/2022 10.5 3.4 - 10.8 x10E3/uL Final  05/02/2021 12.8 (H) 4.0 - 10.5 K/uL Final         Passed - PLT in normal range and within 360 days    Platelets  Date Value Ref Range Status  05/27/2022 220 150 - 450 x10E3/uL Final         Passed - Valid encounter within last 12 months    Recent Outpatient Visits           3 days ago Hypertension associated with diabetes ([place])   [place], Megan P, DO   1 month ago Encounter for annual wellness exam in Medicare patient   [place], [place], DO   7 months ago Routine general medical examination at a health care facility   [place], [place], DO   1 year ago Sciatica, left side   [place], [place], DO   1 year ago [place] Vigg, Avanti, MD        Future Appointments             In 2 months Johnson, Barb Merino, DO [employer], [facility]

## 2022-06-28 ENCOUNTER — Telehealth: Payer: Self-pay | Admitting: Family Medicine

## 2022-06-28 NOTE — Telephone Encounter (Signed)
He should go up to a whole tab a day

## 2022-06-28 NOTE — Telephone Encounter (Signed)
Please check to see if he has been taking his hydralazine

## 2022-06-28 NOTE — Telephone Encounter (Signed)
Spoke with patient and he says he is taking the medication is 0.5 tablet twice a day.

## 2022-06-28 NOTE — Telephone Encounter (Signed)
Please confirm with pharmacy that they have sent out his medication

## 2022-06-28 NOTE — Telephone Encounter (Signed)
Pt. Reports his mail order pharmacy is requesting a new prescription for the Hydralazine for the increase in dose and 90 day supply. Pt. Reports he is completely out of medication. Please advise pt.

## 2022-06-28 NOTE — Telephone Encounter (Signed)
Spoke with patient to follow up on the clarification of his current prescription of Hydralazine. Patient says he has been taking the medication 0.5 tablet twice a day. Patient says he has yet to receive his refill from the mail order. Patient was supposed to follow up in 5 days from visit on 06/24/22 with Dr.Johnson. Patient declined. Per Dr.Johnson, offered patient to have a telephone visit to follow up with blood pressure since patient is able to check blood pressures at home. Patient declined the telephone visit that was offered.

## 2022-06-28 NOTE — Telephone Encounter (Signed)
Spoke with patient to follow up on his prescription Hydralazine. Patient is still having elevations in his at home blood pressure readings. Patient says Monday his reading was "212 over something and then he checked it this morning and it was 177/70." Please advise?

## 2022-06-28 NOTE — Telephone Encounter (Signed)
We need to see if it works before a 68 day supply is sent. I am assuming this is the HYDRALAZINE?

## 2022-07-02 NOTE — Telephone Encounter (Signed)
Spoke with [place] representative in regards to patient's prescription and was informed that his prescription was cancelled due to lack to of response from provider's office. Per representative was unsure why it was cancelled, so he notate in patient's chart to correct it and get it shipped to the patient.

## 2022-07-31 ENCOUNTER — Other Ambulatory Visit: Payer: Self-pay

## 2022-07-31 ENCOUNTER — Emergency Department: Payer: PPO

## 2022-07-31 ENCOUNTER — Inpatient Hospital Stay
Admission: EM | Admit: 2022-07-31 | Discharge: 2022-09-06 | DRG: 871 | Disposition: E | Payer: PPO | Attending: Internal Medicine | Admitting: Internal Medicine

## 2022-07-31 DIAGNOSIS — I63431 Cerebral infarction due to embolism of right posterior cerebral artery: Secondary | ICD-10-CM | POA: Diagnosis not present

## 2022-07-31 DIAGNOSIS — I6621 Occlusion and stenosis of right posterior cerebral artery: Secondary | ICD-10-CM | POA: Diagnosis not present

## 2022-07-31 DIAGNOSIS — Z7984 Long term (current) use of oral hypoglycemic drugs: Secondary | ICD-10-CM

## 2022-07-31 DIAGNOSIS — R4182 Altered mental status, unspecified: Secondary | ICD-10-CM | POA: Diagnosis not present

## 2022-07-31 DIAGNOSIS — N390 Urinary tract infection, site not specified: Secondary | ICD-10-CM | POA: Diagnosis not present

## 2022-07-31 DIAGNOSIS — I6381 Other cerebral infarction due to occlusion or stenosis of small artery: Secondary | ICD-10-CM | POA: Diagnosis not present

## 2022-07-31 DIAGNOSIS — Z4682 Encounter for fitting and adjustment of non-vascular catheter: Secondary | ICD-10-CM | POA: Diagnosis not present

## 2022-07-31 DIAGNOSIS — I6310 Cerebral infarction due to embolism of unspecified precerebral artery: Secondary | ICD-10-CM | POA: Diagnosis not present

## 2022-07-31 DIAGNOSIS — Z79899 Other long term (current) drug therapy: Secondary | ICD-10-CM | POA: Diagnosis not present

## 2022-07-31 DIAGNOSIS — E876 Hypokalemia: Secondary | ICD-10-CM | POA: Diagnosis not present

## 2022-07-31 DIAGNOSIS — I6349 Cerebral infarction due to embolism of other cerebral artery: Secondary | ICD-10-CM | POA: Diagnosis not present

## 2022-07-31 DIAGNOSIS — R5383 Other fatigue: Secondary | ICD-10-CM | POA: Diagnosis present

## 2022-07-31 DIAGNOSIS — I639 Cerebral infarction, unspecified: Secondary | ICD-10-CM

## 2022-07-31 DIAGNOSIS — K5730 Diverticulosis of large intestine without perforation or abscess without bleeding: Secondary | ICD-10-CM | POA: Diagnosis not present

## 2022-07-31 DIAGNOSIS — I480 Paroxysmal atrial fibrillation: Secondary | ICD-10-CM

## 2022-07-31 DIAGNOSIS — T796XXA Traumatic ischemia of muscle, initial encounter: Secondary | ICD-10-CM | POA: Diagnosis present

## 2022-07-31 DIAGNOSIS — R58 Hemorrhage, not elsewhere classified: Secondary | ICD-10-CM | POA: Diagnosis not present

## 2022-07-31 DIAGNOSIS — I2489 Other forms of acute ischemic heart disease: Secondary | ICD-10-CM | POA: Diagnosis not present

## 2022-07-31 DIAGNOSIS — N179 Acute kidney failure, unspecified: Secondary | ICD-10-CM | POA: Diagnosis not present

## 2022-07-31 DIAGNOSIS — U071 COVID-19: Secondary | ICD-10-CM | POA: Insufficient documentation

## 2022-07-31 DIAGNOSIS — N281 Cyst of kidney, acquired: Secondary | ICD-10-CM | POA: Diagnosis not present

## 2022-07-31 DIAGNOSIS — K683 Retroperitoneal hematoma: Secondary | ICD-10-CM | POA: Diagnosis not present

## 2022-07-31 DIAGNOSIS — E873 Alkalosis: Secondary | ICD-10-CM | POA: Diagnosis present

## 2022-07-31 DIAGNOSIS — Z720 Tobacco use: Secondary | ICD-10-CM

## 2022-07-31 DIAGNOSIS — I214 Non-ST elevation (NSTEMI) myocardial infarction: Secondary | ICD-10-CM | POA: Diagnosis not present

## 2022-07-31 DIAGNOSIS — R410 Disorientation, unspecified: Secondary | ICD-10-CM

## 2022-07-31 DIAGNOSIS — A4150 Gram-negative sepsis, unspecified: Secondary | ICD-10-CM | POA: Diagnosis not present

## 2022-07-31 DIAGNOSIS — R29810 Facial weakness: Secondary | ICD-10-CM | POA: Diagnosis not present

## 2022-07-31 DIAGNOSIS — Y92009 Unspecified place in unspecified non-institutional (private) residence as the place of occurrence of the external cause: Secondary | ICD-10-CM

## 2022-07-31 DIAGNOSIS — W19XXXA Unspecified fall, initial encounter: Secondary | ICD-10-CM | POA: Diagnosis present

## 2022-07-31 DIAGNOSIS — T796XXD Traumatic ischemia of muscle, subsequent encounter: Secondary | ICD-10-CM | POA: Diagnosis not present

## 2022-07-31 DIAGNOSIS — A419 Sepsis, unspecified organism: Secondary | ICD-10-CM | POA: Diagnosis not present

## 2022-07-31 DIAGNOSIS — F05 Delirium due to known physiological condition: Secondary | ICD-10-CM | POA: Diagnosis not present

## 2022-07-31 DIAGNOSIS — Z888 Allergy status to other drugs, medicaments and biological substances status: Secondary | ICD-10-CM

## 2022-07-31 DIAGNOSIS — E785 Hyperlipidemia, unspecified: Secondary | ICD-10-CM | POA: Diagnosis not present

## 2022-07-31 DIAGNOSIS — M6282 Rhabdomyolysis: Secondary | ICD-10-CM | POA: Diagnosis not present

## 2022-07-31 DIAGNOSIS — R0689 Other abnormalities of breathing: Secondary | ICD-10-CM | POA: Diagnosis not present

## 2022-07-31 DIAGNOSIS — R1900 Intra-abdominal and pelvic swelling, mass and lump, unspecified site: Secondary | ICD-10-CM | POA: Diagnosis not present

## 2022-07-31 DIAGNOSIS — Z7189 Other specified counseling: Secondary | ICD-10-CM | POA: Diagnosis not present

## 2022-07-31 DIAGNOSIS — Z8249 Family history of ischemic heart disease and other diseases of the circulatory system: Secondary | ICD-10-CM

## 2022-07-31 DIAGNOSIS — N400 Enlarged prostate without lower urinary tract symptoms: Secondary | ICD-10-CM | POA: Diagnosis present

## 2022-07-31 DIAGNOSIS — K219 Gastro-esophageal reflux disease without esophagitis: Secondary | ICD-10-CM | POA: Diagnosis present

## 2022-07-31 DIAGNOSIS — E7800 Pure hypercholesterolemia, unspecified: Secondary | ICD-10-CM | POA: Diagnosis not present

## 2022-07-31 DIAGNOSIS — E119 Type 2 diabetes mellitus without complications: Secondary | ICD-10-CM

## 2022-07-31 DIAGNOSIS — F03918 Unspecified dementia, unspecified severity, with other behavioral disturbance: Secondary | ICD-10-CM | POA: Diagnosis not present

## 2022-07-31 DIAGNOSIS — I4891 Unspecified atrial fibrillation: Secondary | ICD-10-CM | POA: Diagnosis not present

## 2022-07-31 DIAGNOSIS — Z66 Do not resuscitate: Secondary | ICD-10-CM | POA: Diagnosis not present

## 2022-07-31 DIAGNOSIS — I6340 Cerebral infarction due to embolism of unspecified cerebral artery: Secondary | ICD-10-CM | POA: Diagnosis not present

## 2022-07-31 DIAGNOSIS — Z6824 Body mass index (BMI) 24.0-24.9, adult: Secondary | ICD-10-CM

## 2022-07-31 DIAGNOSIS — M25512 Pain in left shoulder: Secondary | ICD-10-CM | POA: Diagnosis not present

## 2022-07-31 DIAGNOSIS — R7989 Other specified abnormal findings of blood chemistry: Secondary | ICD-10-CM

## 2022-07-31 DIAGNOSIS — F0390 Unspecified dementia without behavioral disturbance: Secondary | ICD-10-CM | POA: Diagnosis not present

## 2022-07-31 DIAGNOSIS — R0902 Hypoxemia: Secondary | ICD-10-CM | POA: Diagnosis not present

## 2022-07-31 DIAGNOSIS — D62 Acute posthemorrhagic anemia: Secondary | ICD-10-CM | POA: Diagnosis not present

## 2022-07-31 DIAGNOSIS — I6611 Occlusion and stenosis of right anterior cerebral artery: Secondary | ICD-10-CM | POA: Diagnosis not present

## 2022-07-31 DIAGNOSIS — G9349 Other encephalopathy: Secondary | ICD-10-CM | POA: Diagnosis not present

## 2022-07-31 DIAGNOSIS — E872 Acidosis, unspecified: Secondary | ICD-10-CM | POA: Diagnosis not present

## 2022-07-31 DIAGNOSIS — M25511 Pain in right shoulder: Secondary | ICD-10-CM | POA: Diagnosis not present

## 2022-07-31 DIAGNOSIS — Z833 Family history of diabetes mellitus: Secondary | ICD-10-CM

## 2022-07-31 DIAGNOSIS — A4159 Other Gram-negative sepsis: Principal | ICD-10-CM | POA: Diagnosis present

## 2022-07-31 DIAGNOSIS — L89152 Pressure ulcer of sacral region, stage 2: Secondary | ICD-10-CM | POA: Diagnosis not present

## 2022-07-31 DIAGNOSIS — I6602 Occlusion and stenosis of left middle cerebral artery: Secondary | ICD-10-CM | POA: Diagnosis not present

## 2022-07-31 DIAGNOSIS — Z515 Encounter for palliative care: Secondary | ICD-10-CM | POA: Diagnosis not present

## 2022-07-31 DIAGNOSIS — E8809 Other disorders of plasma-protein metabolism, not elsewhere classified: Secondary | ICD-10-CM | POA: Diagnosis present

## 2022-07-31 DIAGNOSIS — R29717 NIHSS score 17: Secondary | ICD-10-CM | POA: Diagnosis not present

## 2022-07-31 DIAGNOSIS — I10 Essential (primary) hypertension: Secondary | ICD-10-CM | POA: Diagnosis not present

## 2022-07-31 DIAGNOSIS — D649 Anemia, unspecified: Secondary | ICD-10-CM | POA: Diagnosis not present

## 2022-07-31 DIAGNOSIS — T796XXS Traumatic ischemia of muscle, sequela: Secondary | ICD-10-CM | POA: Diagnosis not present

## 2022-07-31 DIAGNOSIS — E870 Hyperosmolality and hypernatremia: Secondary | ICD-10-CM | POA: Diagnosis not present

## 2022-07-31 DIAGNOSIS — L8990 Pressure ulcer of unspecified site, unspecified stage: Secondary | ICD-10-CM | POA: Insufficient documentation

## 2022-07-31 DIAGNOSIS — I499 Cardiac arrhythmia, unspecified: Secondary | ICD-10-CM | POA: Diagnosis not present

## 2022-07-31 DIAGNOSIS — D500 Iron deficiency anemia secondary to blood loss (chronic): Secondary | ICD-10-CM | POA: Diagnosis not present

## 2022-07-31 DIAGNOSIS — R0682 Tachypnea, not elsewhere classified: Secondary | ICD-10-CM | POA: Diagnosis not present

## 2022-07-31 DIAGNOSIS — I700 Atherosclerosis of aorta: Secondary | ICD-10-CM | POA: Diagnosis not present

## 2022-07-31 DIAGNOSIS — Z638 Other specified problems related to primary support group: Secondary | ICD-10-CM

## 2022-07-31 DIAGNOSIS — E663 Overweight: Secondary | ICD-10-CM | POA: Diagnosis present

## 2022-07-31 DIAGNOSIS — R29818 Other symptoms and signs involving the nervous system: Secondary | ICD-10-CM | POA: Diagnosis not present

## 2022-07-31 LAB — URINALYSIS, ROUTINE W REFLEX MICROSCOPIC
Bilirubin Urine: NEGATIVE
Glucose, UA: NEGATIVE mg/dL
Ketones, ur: 20 mg/dL — AB
Leukocytes,Ua: NEGATIVE
Nitrite: POSITIVE — AB
Protein, ur: >=300 mg/dL — AB
Specific Gravity, Urine: 1.026 (ref 1.005–1.030)
Squamous Epithelial / HPF: NONE SEEN /HPF (ref 0–5)
pH: 5 (ref 5.0–8.0)

## 2022-07-31 LAB — COMPREHENSIVE METABOLIC PANEL
ALT: 42 U/L (ref 0–44)
AST: 114 U/L — ABNORMAL HIGH (ref 15–41)
Albumin: 3.7 g/dL (ref 3.5–5.0)
Alkaline Phosphatase: 65 U/L (ref 38–126)
Anion gap: 16 — ABNORMAL HIGH (ref 5–15)
BUN: 35 mg/dL — ABNORMAL HIGH (ref 8–23)
CO2: 18 mmol/L — ABNORMAL LOW (ref 22–32)
Calcium: 8.5 mg/dL — ABNORMAL LOW (ref 8.9–10.3)
Chloride: 104 mmol/L (ref 98–111)
Creatinine, Ser: 1.12 mg/dL (ref 0.61–1.24)
GFR, Estimated: >60 mL/min (ref >60)
Glucose, Bld: 203 mg/dL — ABNORMAL HIGH (ref 70–99)
Potassium: 3.6 mmol/L (ref 3.5–5.1)
Sodium: 138 mmol/L (ref 135–145)
Total Bilirubin: 0.9 mg/dL (ref 0.3–1.2)
Total Protein: 7.5 g/dL (ref 6.5–8.1)

## 2022-07-31 LAB — CBC WITH DIFFERENTIAL/PLATELET
Abs Immature Granulocytes: 0.14 10*3/uL — ABNORMAL HIGH (ref 0.00–0.07)
Basophils Absolute: 0 10*3/uL (ref 0.0–0.1)
Basophils Relative: 0 %
Eosinophils Absolute: 0 10*3/uL (ref 0.0–0.5)
Eosinophils Relative: 0 %
HCT: 39.6 % (ref 39.0–52.0)
Hemoglobin: 13 g/dL (ref 13.0–17.0)
Immature Granulocytes: 1 %
Lymphocytes Relative: 5 %
Lymphs Abs: 0.9 10*3/uL (ref 0.7–4.0)
MCH: 29.6 pg (ref 26.0–34.0)
MCHC: 32.8 g/dL (ref 30.0–36.0)
MCV: 90.2 fL (ref 80.0–100.0)
Monocytes Absolute: 1.7 10*3/uL — ABNORMAL HIGH (ref 0.1–1.0)
Monocytes Relative: 9 %
Neutro Abs: 15.9 10*3/uL — ABNORMAL HIGH (ref 1.7–7.7)
Neutrophils Relative %: 85 %
Platelets: 221 10*3/uL (ref 150–400)
RBC: 4.39 MIL/uL (ref 4.22–5.81)
RDW: 12.7 % (ref 11.5–15.5)
WBC: 18.6 10*3/uL — ABNORMAL HIGH (ref 4.0–10.5)
nRBC: 0 % (ref 0.0–0.2)

## 2022-07-31 LAB — CK: Total CK: 4431 U/L — ABNORMAL HIGH (ref 49–397)

## 2022-07-31 LAB — TROPONIN I (HIGH SENSITIVITY)
Troponin I (High Sensitivity): 1589 ng/L (ref <18)
Troponin I (High Sensitivity): 2131 ng/L (ref <18)

## 2022-07-31 LAB — PROCALCITONIN: Procalcitonin: <0.1 ng/mL

## 2022-07-31 LAB — MAGNESIUM: Magnesium: 1.8 mg/dL (ref 1.7–2.4)

## 2022-07-31 LAB — LACTIC ACID, PLASMA
Lactic Acid, Venous: 3.1 mmol/L (ref 0.5–1.9)
Lactic Acid, Venous: 2 mmol/L (ref 0.5–1.9)

## 2022-07-31 MED ORDER — SODIUM CHLORIDE 0.9 % IV SOLN
2.0000 g | Freq: Once | INTRAVENOUS | Status: AC
  Administered 2022-07-31: 2 g via INTRAVENOUS
  Filled 2022-07-31: qty 20

## 2022-07-31 MED ORDER — SODIUM CHLORIDE 0.9 % IV BOLUS
1000.0000 mL | Freq: Once | INTRAVENOUS | Status: AC
  Administered 2022-07-31: 1000 mL via INTRAVENOUS

## 2022-07-31 MED ORDER — LACTATED RINGERS IV BOLUS
500.0000 mL | Freq: Once | INTRAVENOUS | Status: AC
  Administered 2022-07-31: 500 mL via INTRAVENOUS

## 2022-07-31 MED ORDER — LACTATED RINGERS IV BOLUS
1000.0000 mL | Freq: Once | INTRAVENOUS | Status: AC
  Administered 2022-07-31: 1000 mL via INTRAVENOUS

## 2022-07-31 MED ORDER — IOHEXOL 300 MG/ML  SOLN
100.0000 mL | Freq: Once | INTRAMUSCULAR | Status: AC | PRN
  Administered 2022-07-31: 100 mL via INTRAVENOUS

## 2022-07-31 MED ORDER — METOPROLOL TARTRATE 5 MG/5ML IV SOLN
2.5000 mg | Freq: Once | INTRAVENOUS | Status: AC
  Administered 2022-07-31: 2.5 mg via INTRAVENOUS
  Filled 2022-07-31: qty 5

## 2022-07-31 MED ORDER — HEPARIN (PORCINE) 25000 UT/250ML-% IV SOLN
1150.0000 [IU]/h | INTRAVENOUS | Status: DC
  Administered 2022-07-31: 950 [IU]/h via INTRAVENOUS
  Administered 2022-08-01 – 2022-08-08 (×8): 1150 [IU]/h via INTRAVENOUS
  Filled 2022-07-31 (×9): qty 250

## 2022-07-31 MED ORDER — HEPARIN BOLUS VIA INFUSION
4000.0000 [IU] | Freq: Once | INTRAVENOUS | Status: AC
  Administered 2022-07-31: 4000 [IU] via INTRAVENOUS
  Filled 2022-07-31: qty 4000

## 2022-07-31 MED ORDER — ASPIRIN 81 MG PO CHEW
324.0000 mg | CHEWABLE_TABLET | Freq: Once | ORAL | Status: AC
  Administered 2022-07-31: 324 mg via ORAL
  Filled 2022-07-31: qty 4

## 2022-07-31 NOTE — Consult Note (Addendum)
ANTICOAGULATION CONSULT NOTE - Initial Consult  Pharmacy Consult for heparin infusion Indication: chest pain/ACS  Allergies  Allergen Reactions   Amlodipine Swelling   Flomax [Tamsulosin] Swelling    Pt states swelling to face and swelling to lower extremities   Hydrochlorothiazide Other (See Comments)    Urinary frequency   Prednisone Other (See Comments)    steroids   Tetracycline     Other reaction(s): Unknown   Tetracyclines & Related    Tramadol Other (See Comments)    Keeps him awake    Patient Measurements: Height: '5\' 7"'$  (170.2 cm) Weight: 79.7 kg (175 lb 11.3 oz) IBW/kg (Calculated) : 66.1 Heparin Dosing Weight: 79.7 kg  Vital Signs: Temp: 98.3 F (36.8 C) (01/24 1751) BP: 114/82 (01/24 1830) Pulse Rate: 144 (01/24 1830)  Labs: Recent Labs    08/06/2022 1747 08/07/2022 1924  HGB 13.0  --   HCT 39.6  --   PLT 221  --   CREATININE 1.12  --   TROPONINIHS 1,589* 2,131*    Estimated Creatinine Clearance: 56.7 mL/min (by C-G formula based on SCr of 1.12 mg/dL).   Medical History: Past Medical History:  Diagnosis Date   Diabetes mellitus without complication ([place])    History of kidney stones    Hyperlipidemia    Hypertension     Medications:  PTA: N/A Inpatient: Heparin infusion 1/24 >> Allergies: No AC/APT related allergies  Assessment: 77 year old male with PMH idabetes found on floor by EMS. Troponins increased from 1589 to 2131. EKG pending. Pharmacy consulted for heparin infusion in the setting of suspected ACS.  Goal of Therapy:  Heparin level 0.3-0.7 units/ml Monitor platelets by anticoagulation protocol: Yes  Date Time aPTT/HL Rate/Comment  Plan:  Order aPTT STAT x1 Give 4000 units bolus x1; then start heparin infusion at 950 units/hr Check anti-Xa level in 8 hours and daily once consecutively therapeutic. Continue to monitor H&H and platelets daily while on heparin gtt.   [place] Pharmacist 07/18/2022 8:36  PM

## 2022-07-31 NOTE — ED Notes (Signed)
Pts friend at bedside, pt has infiltrated IV on left forearm. IV removed, pressure applied.

## 2022-07-31 NOTE — ED Triage Notes (Signed)
PT arrived by [facility] after being found on floor from a wellness check- family states he has not heard from him since Sunday.  Pt was found covered in urine, responsive, but not oriented. History of diabetes.  Pt states he felt dizzy before falling and has been on the floor since Monday. Reports pain in left side, primarily in his shoulder. A&Ox4 upon arrival.

## 2022-07-31 NOTE — ED Provider Notes (Signed)
[facility] Provider Note    Event Date/Time   First MD Initiated Contact with Patient 07/28/2022 1735     (approximate)   History   Altered Mental Status   HPI  ARBEN PACKMAN is a 77 y.o. male  here with AMS. Pt reports that around 4 days ago, he felt very weak and was unable to get out of his chair. He had some increasing urinary frequency leading up to this. Since then, he was unable to get out of his chair and had to lower himself to the ground. He has felt weak, fatigued, and had diffuse body pain since then from lying on the ground. He has not had any of his medications and nothing to eat/drink.       Physical Exam   Triage Vital Signs: ED Triage Vitals  Enc Vitals Group     BP 07/19/2022 1751 (!) 150/102     Pulse --      Resp 07/24/2022 1751 (!) 24     Temp 08/04/2022 1751 98.3 F (36.8 C)     Temp src --      SpO2 08/04/2022 1733 95 %     Weight --      Height 07/20/2022 1752 '5\' 7"'$  (1.702 m)     Head Circumference --      Peak Flow --      Pain Score 07/08/2022 1752 6     Pain Loc --      Pain Edu? --      Excl. in [place]? --     Most recent vital signs: Vitals:   07/20/2022 1751 07/26/2022 1830  BP: (!) 150/102 114/82  Pulse:  (!) 144  Resp: (!) 24 18  Temp: 98.3 F (36.8 C)   SpO2: 100% 98%     General: Awake, no distress.  CV:  Good peripheral perfusion. Tachycardic, irregular. Resp:  Mild tachypnea, normal WOB. Abd:  No distention. No tenderness. Other:  No LE edema.   ED Results / Procedures / Treatments   Labs (all labs ordered are listed, but only abnormal results are displayed) Labs Reviewed  CBC WITH DIFFERENTIAL/PLATELET - Abnormal; Notable for the following components:      Result Value   WBC 18.6 (*)    Neutro Abs 15.9 (*)    Monocytes Absolute 1.7 (*)    Abs Immature Granulocytes 0.14 (*)    All other components within normal limits  COMPREHENSIVE METABOLIC PANEL - Abnormal; Notable for the following components:    CO2 18 (*)    Glucose, Bld 203 (*)    BUN 35 (*)    Calcium 8.5 (*)    AST 114 (*)    Anion gap 16 (*)    All other components within normal limits  LACTIC ACID, PLASMA - Abnormal; Notable for the following components:   Lactic Acid, Venous 3.1 (*)    All other components within normal limits  LACTIC ACID, PLASMA - Abnormal; Notable for the following components:   Lactic Acid, Venous 2.0 (*)    All other components within normal limits  CK - Abnormal; Notable for the following components:   Total CK 4,431 (*)    All other components within normal limits  URINALYSIS, ROUTINE W REFLEX MICROSCOPIC - Abnormal; Notable for the following components:   Color, Urine YELLOW (*)    APPearance HAZY (*)    Hgb urine dipstick LARGE (*)    Ketones, ur 20 (*)  Protein, ur >=300 (*)    Nitrite POSITIVE (*)    Bacteria, UA MANY (*)    All other components within normal limits  TROPONIN I (HIGH SENSITIVITY) - Abnormal; Notable for the following components:   Troponin I (High Sensitivity) 1,589 (*)    All other components within normal limits  TROPONIN I (HIGH SENSITIVITY) - Abnormal; Notable for the following components:   Troponin I (High Sensitivity) 2,131 (*)    All other components within normal limits  CULTURE, BLOOD (SINGLE)  URINE CULTURE  MAGNESIUM  PROCALCITONIN  HEPARIN LEVEL (UNFRACTIONATED)  CBC     EKG AFib VR 146. QRS 93, QTc 484. No acute ST elevations or depressions. Non-specific ST changes, likely rate related/demand.   RADIOLOGY CT A/P: no acute process CT Head: Chronic basal ganglia infarct CXR: Mild vascular congestion   I also independently reviewed and agree with radiologist interpretations.   PROCEDURES:  Critical Care performed: Yes, see critical care procedure note(s)  .Critical Care  Performed by: Duffy Bruce, MD Authorized by: Duffy Bruce, MD   Critical care provider statement:    Critical care time (minutes):  30   Critical care time  was exclusive of:  Separately billable procedures and treating other patients   Critical care was necessary to treat or prevent imminent or life-threatening deterioration of the following conditions:  Cardiac failure, circulatory failure, sepsis and respiratory failure   Critical care was time spent personally by me on the following activities:  Development of treatment plan with patient or surrogate, discussions with consultants, evaluation of patient's response to treatment, examination of patient, ordering and review of laboratory studies, ordering and review of radiographic studies, ordering and performing treatments and interventions, pulse oximetry, re-evaluation of patient's condition and review of old [place] ED: Medications  heparin ADULT infusion 100 units/mL (25000 units/274m) (950 Units/hr Intravenous New Bag/Given 07/22/2022 2126)  metoprolol tartrate (LOPRESSOR) injection 2.5 mg (has no administration in time range)  aspirin chewable tablet 324 mg (has no administration in time range)  sodium chloride 0.9 % bolus 1,000 mL (0 mLs Intravenous Stopped 08/01/2022 2228)  metoprolol tartrate (LOPRESSOR) injection 2.5 mg (2.5 mg Intravenous Given 07/09/2022 2017)  iohexol (OMNIPAQUE) 300 MG/ML solution 100 mL (100 mLs Intravenous Contrast Given 07/19/2022 1932)  lactated ringers bolus 1,000 mL (0 mLs Intravenous Stopped 07/15/2022 2228)  lactated ringers bolus 500 mL (500 mLs Intravenous New Bag/Given 07/17/2022 2129)  cefTRIAXone (ROCEPHIN) 2 g in sodium chloride 0.9 % 100 mL IVPB (0 g Intravenous Stopped 08/07/2022 2128)  heparin bolus via infusion 4,000 Units (4,000 Units Intravenous Bolus from Bag 07/24/2022 2126)     IMPRESSION / MDM / [place]/ ED COURSE  I reviewed the triage vital signs and the nursing notes.                              Differential diagnosis includes, but is not limited to, rhabdo, sepsis (UTI, PNA), ACS, AFib RVR  Patient's presentation  is most consistent with acute presentation with potential threat to life or bodily function.  The patient is on the cardiac monitor to evaluate for evidence of arrhythmia and/or significant heart rate changes.  77yo M here with generalized weakness after being down for several days. Suspect UTI with subsequent weakness, causing him to fall. He likely then became dehydrated with subsequent new-onset AFib RVR and mild NSTEMI related to this. Labs  show lactic acidosis likely multifactorial 2/2 above. Rocephin started for UTI and IV metop for his AFib. Heparin gtt started. Admit to medicine.  FINAL CLINICAL IMPRESSION(S) / ED DIAGNOSES   Final diagnoses:  Sepsis secondary to UTI [facility])  Traumatic rhabdomyolysis, initial encounter [facility])  Atrial fibrillation with rapid ventricular response ([place])  NSTEMI (non-ST elevated myocardial infarction) ([place])     Rx / DC Orders   ED Discharge Orders     None        Note:  This document was prepared using Dragon voice recognition software and may include unintentional dictation errors.   Duffy Bruce, MD 07/30/2022 2240

## 2022-07-31 NOTE — ED Notes (Signed)
Gave patient some graham crackers to eat.with gingerale.

## 2022-07-31 NOTE — ED Notes (Signed)
Pt care taken, needs iv placed. Put Iv in rt forearm. Going to ct scan.

## 2022-08-01 ENCOUNTER — Inpatient Hospital Stay (HOSPITAL_COMMUNITY)
Admit: 2022-08-01 | Discharge: 2022-08-01 | Disposition: A | Discharge status: Home / Self Care | Payer: PPO | Attending: Family Medicine | Admitting: Family Medicine

## 2022-08-01 ENCOUNTER — Inpatient Hospital Stay: Payer: PPO

## 2022-08-01 ENCOUNTER — Other Ambulatory Visit (HOSPITAL_COMMUNITY): Payer: Self-pay

## 2022-08-01 DIAGNOSIS — A419 Sepsis, unspecified organism: Principal | ICD-10-CM

## 2022-08-01 DIAGNOSIS — R7989 Other specified abnormal findings of blood chemistry: Secondary | ICD-10-CM

## 2022-08-01 DIAGNOSIS — I480 Paroxysmal atrial fibrillation: Secondary | ICD-10-CM | POA: Insufficient documentation

## 2022-08-01 DIAGNOSIS — I4891 Unspecified atrial fibrillation: Secondary | ICD-10-CM | POA: Diagnosis not present

## 2022-08-01 DIAGNOSIS — E785 Hyperlipidemia, unspecified: Secondary | ICD-10-CM | POA: Diagnosis present

## 2022-08-01 DIAGNOSIS — E119 Type 2 diabetes mellitus without complications: Secondary | ICD-10-CM

## 2022-08-01 DIAGNOSIS — M6282 Rhabdomyolysis: Secondary | ICD-10-CM

## 2022-08-01 DIAGNOSIS — N390 Urinary tract infection, site not specified: Principal | ICD-10-CM

## 2022-08-01 DIAGNOSIS — I10 Essential (primary) hypertension: Secondary | ICD-10-CM | POA: Insufficient documentation

## 2022-08-01 DIAGNOSIS — I214 Non-ST elevation (NSTEMI) myocardial infarction: Secondary | ICD-10-CM

## 2022-08-01 LAB — ECHOCARDIOGRAM COMPLETE
AR max vel: 2.38 cm2
AV Area VTI: 2.53 cm2
AV Area mean vel: 2.33 cm2
AV Mean grad: 3.5 mmHg
AV Peak grad: 6.1 mmHg
Ao pk vel: 1.23 m/s
Area-P 1/2: 3.85 cm2
Calc EF: 50.1 %
Height: 67 in
MV M vel: 5.14 m/s
MV Peak grad: 105.6 mmHg
P 1/2 time: 1346 msec
S' Lateral: 3 cm
Single Plane A2C EF: 52.2 %
Single Plane A4C EF: 50.3 %
Weight: 2811.31 oz

## 2022-08-01 LAB — CBC
HCT: 34.8 % — ABNORMAL LOW (ref 39.0–52.0)
Hemoglobin: 11.7 g/dL — ABNORMAL LOW (ref 13.0–17.0)
MCH: 29.6 pg (ref 26.0–34.0)
MCHC: 33.6 g/dL (ref 30.0–36.0)
MCV: 88.1 fL (ref 80.0–100.0)
Platelets: 188 10*3/uL (ref 150–400)
RBC: 3.95 MIL/uL — ABNORMAL LOW (ref 4.22–5.81)
RDW: 12.5 % (ref 11.5–15.5)
WBC: 12.5 10*3/uL — ABNORMAL HIGH (ref 4.0–10.5)
nRBC: 0 % (ref 0.0–0.2)

## 2022-08-01 LAB — GLUCOSE, CAPILLARY: Glucose-Capillary: 139 mg/dL — ABNORMAL HIGH (ref 70–99)

## 2022-08-01 LAB — HEPARIN LEVEL (UNFRACTIONATED)
Heparin Unfractionated: 0.41 IU/mL (ref 0.30–0.70)
Heparin Unfractionated: 0.14 IU/mL — ABNORMAL LOW (ref 0.30–0.70)

## 2022-08-01 LAB — TROPONIN I (HIGH SENSITIVITY)
Troponin I (High Sensitivity): 2602 ng/L (ref <18)
Troponin I (High Sensitivity): 2867 ng/L (ref <18)

## 2022-08-01 LAB — HEMOGLOBIN A1C
Hgb A1c MFr Bld: 7.3 % — ABNORMAL HIGH (ref 4.8–5.6)
Mean Plasma Glucose: 162.81 mg/dL

## 2022-08-01 LAB — CKMB (ARMC ONLY): CK, MB: 20.9 ng/mL — ABNORMAL HIGH (ref 0.5–5.0)

## 2022-08-01 LAB — CBG MONITORING, ED
Glucose-Capillary: 281 mg/dL — ABNORMAL HIGH (ref 70–99)
Glucose-Capillary: 171 mg/dL — ABNORMAL HIGH (ref 70–99)

## 2022-08-01 LAB — CK: Total CK: 3485 U/L — ABNORMAL HIGH (ref 49–397)

## 2022-08-01 LAB — CORTISOL-AM, BLOOD: Cortisol - AM: 25.9 ug/dL — ABNORMAL HIGH (ref 6.7–22.6)

## 2022-08-01 LAB — PROTIME-INR
INR: 1.2 (ref 0.8–1.2)
Prothrombin Time: 15.2 seconds (ref 11.4–15.2)

## 2022-08-01 LAB — PROCALCITONIN: Procalcitonin: <0.1 ng/mL

## 2022-08-01 MED ORDER — ACETAMINOPHEN 650 MG RE SUPP: 650.0000 mg | Freq: Four times a day (QID) | RECTAL | Status: DC | PRN

## 2022-08-01 MED ORDER — SODIUM CHLORIDE 0.9 % IV SOLN: INTRAVENOUS | Status: DC

## 2022-08-01 MED ORDER — INSULIN ASPART 100 UNIT/ML IJ SOLN
0.0000 [IU] | Freq: Three times a day (TID) | INTRAMUSCULAR | Status: DC
  Administered 2022-08-01: 5 [IU] via SUBCUTANEOUS
  Administered 2022-08-01 – 2022-08-02 (×2): 2 [IU] via SUBCUTANEOUS
  Administered 2022-08-02: 1 [IU] via SUBCUTANEOUS
  Administered 2022-08-02: 3 [IU] via SUBCUTANEOUS
  Administered 2022-08-03: 1 [IU] via SUBCUTANEOUS
  Administered 2022-08-03: 2 [IU] via SUBCUTANEOUS
  Administered 2022-08-03: 3 [IU] via SUBCUTANEOUS
  Administered 2022-08-04: 2 [IU] via SUBCUTANEOUS
  Administered 2022-08-04 (×2): 3 [IU] via SUBCUTANEOUS
  Administered 2022-08-05: 5 [IU] via SUBCUTANEOUS
  Administered 2022-08-05 – 2022-08-07 (×3): 2 [IU] via SUBCUTANEOUS
  Administered 2022-08-07: 1 [IU] via SUBCUTANEOUS
  Administered 2022-08-07 – 2022-08-08 (×2): 3 [IU] via SUBCUTANEOUS
  Administered 2022-08-08: 2 [IU] via SUBCUTANEOUS
  Administered 2022-08-08: 3 [IU] via SUBCUTANEOUS
  Administered 2022-08-09: 2 [IU] via SUBCUTANEOUS
  Administered 2022-08-09: 3 [IU] via SUBCUTANEOUS
  Administered 2022-08-10 – 2022-08-11 (×4): 1 [IU] via SUBCUTANEOUS
  Filled 2022-08-01 (×27): qty 1

## 2022-08-01 MED ORDER — ONDANSETRON HCL 4 MG/2ML IJ SOLN: 4.0000 mg | Freq: Four times a day (QID) | INTRAMUSCULAR | Status: DC | PRN

## 2022-08-01 MED ORDER — HEPARIN BOLUS VIA INFUSION
2300.0000 [IU] | Freq: Once | INTRAVENOUS | Status: AC
  Administered 2022-08-01: 2300 [IU] via INTRAVENOUS
  Filled 2022-08-01: qty 2300

## 2022-08-01 MED ORDER — ASPIRIN 81 MG PO CHEW
81.0000 mg | CHEWABLE_TABLET | ORAL | Status: AC
  Administered 2022-08-02: 81 mg via ORAL
  Filled 2022-08-01: qty 1

## 2022-08-01 MED ORDER — POTASSIUM CHLORIDE 20 MEQ PO PACK
40.0000 meq | PACK | Freq: Once | ORAL | Status: AC
  Administered 2022-08-01: 40 meq via ORAL
  Filled 2022-08-01: qty 2

## 2022-08-01 MED ORDER — SODIUM CHLORIDE 0.9 % WEIGHT BASED INFUSION
3.0000 mL/kg/h | INTRAVENOUS | Status: AC
  Administered 2022-08-02: 3 mL/kg/h via INTRAVENOUS

## 2022-08-01 MED ORDER — MAGNESIUM HYDROXIDE 400 MG/5ML PO SUSP: 30.0000 mL | Freq: Every day | ORAL | Status: DC | PRN

## 2022-08-01 MED ORDER — ASPIRIN 81 MG PO TBEC
81.0000 mg | DELAYED_RELEASE_TABLET | Freq: Every day | ORAL | Status: DC
  Administered 2022-08-01 – 2022-08-07 (×6): 81 mg via ORAL
  Filled 2022-08-01 (×8): qty 1

## 2022-08-01 MED ORDER — SODIUM CHLORIDE 0.9% FLUSH
3.0000 mL | Freq: Two times a day (BID) | INTRAVENOUS | Status: DC
  Administered 2022-08-01 – 2022-08-08 (×11): 3 mL via INTRAVENOUS

## 2022-08-01 MED ORDER — ATORVASTATIN CALCIUM 20 MG PO TABS
20.0000 mg | ORAL_TABLET | Freq: Every day | ORAL | Status: DC
  Administered 2022-08-01 – 2022-08-05 (×5): 20 mg via ORAL
  Filled 2022-08-01 (×5): qty 1

## 2022-08-01 MED ORDER — MAGNESIUM SULFATE IN D5W 1-5 GM/100ML-% IV SOLN
1.0000 g | Freq: Once | INTRAVENOUS | Status: AC
  Administered 2022-08-01: 1 g via INTRAVENOUS
  Filled 2022-08-01: qty 100

## 2022-08-01 MED ORDER — METOPROLOL TARTRATE 25 MG PO TABS
25.0000 mg | ORAL_TABLET | Freq: Two times a day (BID) | ORAL | Status: DC
  Administered 2022-08-01 – 2022-08-07 (×14): 25 mg via ORAL
  Filled 2022-08-01 (×15): qty 1

## 2022-08-01 MED ORDER — PANTOPRAZOLE SODIUM 20 MG PO TBEC
20.0000 mg | DELAYED_RELEASE_TABLET | Freq: Every day | ORAL | Status: DC
  Administered 2022-08-01 – 2022-08-07 (×7): 20 mg via ORAL
  Filled 2022-08-01 (×11): qty 1

## 2022-08-01 MED ORDER — ACETAMINOPHEN 325 MG PO TABS
650.0000 mg | ORAL_TABLET | Freq: Four times a day (QID) | ORAL | Status: DC | PRN
  Administered 2022-08-01 – 2022-08-03 (×5): 650 mg via ORAL
  Filled 2022-08-01 (×5): qty 2

## 2022-08-01 MED ORDER — ONDANSETRON HCL 4 MG PO TABS: 4.0000 mg | ORAL_TABLET | Freq: Four times a day (QID) | ORAL | Status: DC | PRN

## 2022-08-01 MED ORDER — SODIUM CHLORIDE 0.9 % IV SOLN
2.0000 g | INTRAVENOUS | Status: DC
  Administered 2022-08-01: 2 g via INTRAVENOUS
  Filled 2022-08-01 (×2): qty 20

## 2022-08-01 MED ORDER — SODIUM CHLORIDE 0.9 % IV SOLN
250.0000 mL | INTRAVENOUS | Status: DC | PRN
  Administered 2022-08-08: 250 mL via INTRAVENOUS

## 2022-08-01 MED ORDER — SODIUM CHLORIDE 0.9 % WEIGHT BASED INFUSION
1.0000 mL/kg/h | INTRAVENOUS | Status: DC
  Administered 2022-08-02: 1 mL/kg/h via INTRAVENOUS

## 2022-08-01 MED ORDER — TRAZODONE HCL 50 MG PO TABS
25.0000 mg | ORAL_TABLET | Freq: Every evening | ORAL | Status: DC | PRN
  Administered 2022-08-01 – 2022-08-07 (×6): 25 mg via ORAL
  Filled 2022-08-01 (×6): qty 1

## 2022-08-01 MED ORDER — SODIUM CHLORIDE 0.9% FLUSH: 3.0000 mL | INTRAVENOUS | Status: DC | PRN

## 2022-08-01 MED ORDER — INSULIN ASPART 100 UNIT/ML IJ SOLN
0.0000 [IU] | Freq: Every day | INTRAMUSCULAR | Status: DC
  Administered 2022-08-07 – 2022-08-08 (×2): 2 [IU] via SUBCUTANEOUS
  Filled 2022-08-01 (×3): qty 1

## 2022-08-01 NOTE — Consult Note (Addendum)
[place] for heparin infusion Indication: chest pain/ACS and atrial fibrillation  Allergies  Allergen Reactions   Amlodipine Swelling   Flomax [Tamsulosin] Swelling    Pt states swelling to face and swelling to lower extremities   Hydrochlorothiazide Other (See Comments)    Urinary frequency   Prednisone Other (See Comments)    steroids   Tetracycline     Other reaction(s): Unknown   Tetracyclines & Related    Tramadol Other (See Comments)    Keeps him awake    Patient Measurements: Height: '5\' 7"'$  (170.2 cm) Weight: 79.7 kg (175 lb 11.3 oz) IBW/kg (Calculated) : 66.1 Heparin Dosing Weight: 79.7 kg  Vital Signs: Temp: 99 F (37.2 C) (01/25 0910) Temp Source: Axillary (01/25 0910) BP: 131/63 (01/25 1130) Pulse Rate: 85 (01/25 1130)  Labs: Recent Labs    08/04/2022 1747 07/20/2022 1924 08/01/22 0242 08/01/22 0545 08/01/22 0802 08/01/22 1248  HGB 13.0  --  11.7*  --   --   --   HCT 39.6  --  34.8*  --   --   --   PLT 221  --  188  --   --   --   LABPROT  --   --  15.2  --   --   --   INR  --   --  1.2  --   --   --   HEPARINUNFRC  --   --  0.41  --   --  0.14*  CREATININE 1.12  --   --   --   --   --   CKTOTAL  --  4,431* 3,485*  --   --   --   CKMB  --   --  20.9*  --   --   --   TROPONINIHS 1,589* 2,131*  --  2,602* 2,867*  --      Estimated Creatinine Clearance: 56.7 mL/min (by C-G formula based on SCr of 1.12 mg/dL).   Medical History: Past Medical History:  Diagnosis Date   Diabetes mellitus without complication ([place])    History of kidney stones    Hyperlipidemia    Hypertension     Medications:  PTA: N/A Inpatient: Heparin infusion 1/24 >> Allergies: No AC/APT related allergies  Assessment: 77 year old male with PMH idabetes found on floor by EMS. Troponins increased from 1589 to 2131. EKG pending. Pharmacy consulted for heparin infusion in the setting of suspected ACS. Pt was in afib and will likely need  DOAC at discharge.   Goal of Therapy:  Heparin level 0.3-0.7 units/ml Monitor platelets by anticoagulation protocol: Yes  1/25 0242 HL 0.41, therapeutic x 1 1/25 1330 HL 0.14, subtherapeutic.   Plan:  Heparin level is subtherapeutic. Will give heparin bolus of 2300 units x 1 and increase the heparin infusion to 1150 units/hr. Recheck heparin level in 8 hours. CBC daily while heparin.    Eleonore Chiquito, PharmD 08/01/2022 2:06 PM

## 2022-08-01 NOTE — Consult Note (Signed)
[place] for heparin infusion Indication: chest pain/ACS  Allergies  Allergen Reactions   Amlodipine Swelling   Flomax [Tamsulosin] Swelling    Pt states swelling to face and swelling to lower extremities   Hydrochlorothiazide Other (See Comments)    Urinary frequency   Prednisone Other (See Comments)    steroids   Tetracycline     Other reaction(s): Unknown   Tetracyclines & Related    Tramadol Other (See Comments)    Keeps him awake    Patient Measurements: Height: '5\' 7"'$  (170.2 cm) Weight: 79.7 kg (175 lb 11.3 oz) IBW/kg (Calculated) : 66.1 Heparin Dosing Weight: 79.7 kg  Vital Signs: Temp: 98.1 F (36.7 C) (01/25 0000) BP: 127/78 (01/25 0200) Pulse Rate: 155 (01/25 0200)  Labs: Recent Labs    07/28/2022 1747 07/20/2022 1924 08/01/22 0242  HGB 13.0  --  11.7*  HCT 39.6  --  34.8*  PLT 221  --  188  HEPARINUNFRC  --   --  0.41  CREATININE 1.12  --   --   CKTOTAL  --  4,431*  --   TROPONINIHS 1,589* 2,131*  --      Estimated Creatinine Clearance: 56.7 mL/min (by C-G formula based on SCr of 1.12 mg/dL).   Medical History: Past Medical History:  Diagnosis Date   Diabetes mellitus without complication ([place])    History of kidney stones    Hyperlipidemia    Hypertension     Medications:  PTA: N/A Inpatient: Heparin infusion 1/24 >> Allergies: No AC/APT related allergies  Assessment: 77 year old male with PMH idabetes found on floor by EMS. Troponins increased from 1589 to 2131. EKG pending. Pharmacy consulted for heparin infusion in the setting of suspected ACS.  Goal of Therapy:  Heparin level 0.3-0.7 units/ml Monitor platelets by anticoagulation protocol: Yes  1/25 0242 HL 0.41, therapeutic x 1  Plan:  Continue heparin infusion at 950 units/hr Recheck HL in 8 hr to confirm CBC daily while on heparin  Renda Rolls, PharmD, [facility] 08/01/2022 3:47 AM

## 2022-08-01 NOTE — Assessment & Plan Note (Addendum)
Received 5 days of cefepime.  Patient had Enterobacter in the urine culture.

## 2022-08-01 NOTE — Progress Notes (Signed)
  Progress Note   Patient: Russell Byrd:[id number] DOB: 1946-06-29 DOA: 07/10/2022     1 DOS: the patient was seen and examined on 08/01/2022   Brief hospital course:  Assessment and Plan: * Sepsis due to gram-negative UTI (HCC) - IV NS 100 cc/hr  - IV ceftriaxone 2 g daily   Rhabdomyolysis - CK downtrending  - IV fluids as above   NSTEMI/Elevated troponin I level - IV heparin drip with anti-coagulation monitoring by pharmacy  - Cardiac cath at cardiology's discretion on 08/02/2022 - ASA 81 mg PO daily  - Lipitor 20 mg PO daily - Lopressor 25 mg PO bid    Paroxysmal atrial fibrillation with RVR (HCC) - IV heparin drip  - Lopressor 25 mg PO bid   Dyslipidemia - Atorvastatin 20 mg PO daily   Type 2 diabetes mellitus without complications (HCC) - Novolog sliding scale tid and bedtime   Essential hypertension - Lopressor as above   DVT prophylaxis: IV heparin drip as above  GI prophylaxis: Protonix 20 mg PO dailyl      Subjective: Pt seen and examined at the bedside with cardiology this morning. Cardio is planning for cardiac cath tmr 08/02/2022.  CK downtrending. Continue with IV fluids and IV antibx's.   Physical Exam: Vitals:   08/01/22 0700 08/01/22 0800 08/01/22 0910 08/01/22 1130  BP: 126/61 128/60  131/63  Pulse: 81 78  85  Resp: (!) 25 (!) 29  (!) 21  Temp:   99 F (37.2 C)   TempSrc:   Axillary   SpO2: 98% 96%  96%  Weight:      Height:       Physical Exam HENT:     Head: Normocephalic.     Mouth/Throat:     Mouth: Mucous membranes are moist.  Cardiovascular:     Rate and Rhythm: Normal rate and regular rhythm.  Pulmonary:     Effort: Pulmonary effort is normal.  Abdominal:     General: Abdomen is flat.     Palpations: Abdomen is soft.  Musculoskeletal:        General: No swelling.     Cervical back: Neck supple.  Skin:    General: Skin is warm and dry.  Neurological:     Mental Status: He is alert. Mental status is at baseline.   Psychiatric:        Mood and Affect: Mood normal.     Data Reviewed:    Disposition: Status is: Inpatient  Planned Discharge Destination: Disposition pending at this time     Time spent: 35 minutes  Author: Lucienne Minks , MD 08/01/2022 1:48 PM  For on call review [email or web address].

## 2022-08-01 NOTE — H&P (Signed)
[place]   PATIENT NAME: Russell Byrd    MR#:  [id number]  DATE OF BIRTH:  06-03-1946  DATE OF ADMISSION:  07/12/2022  PRIMARY CARE PHYSICIAN: Valerie Roys, DO   Patient is coming from: Home  REQUESTING/REFERRING PHYSICIAN: Duffy Bruce, MD CHIEF COMPLAINT:   Chief Complaint  Patient presents with   Altered Mental Status    HISTORY OF PRESENT ILLNESS:  Russell Byrd is a 77 y.o. Caucasian male with medical history significant for Pepto-Bismol's, hypertension, dyslipidemia and urolithiasis, who presented to the emergency room with acute onset of altered mental status and fall and staying down on the floor for 3 days.  The patient admitted to urinary frequency, urgency and dysuria without flank pain or hematuria.  He has occasional cough and dyspnea that wheezing.  No chest pain or palpitations.  No nausea or vomiting or abdominal pain.  No headache or dizziness or blurred vision.  No paresthesias or focal muscle weakness.  ED Course: When he came to the ER, heart rate was 152 with respiratory rate of 22 and otherwise unremarkable all vital signs.  Labs revealed a BUN of 35 and glucose of 203 with CO2 of 18 and anion gap of 16.  High sensitive troponin I was 1589 and later 07/28/1929 with total CK of 4431.  Lactic acid was 3.1 and later 2 and procalcitonin was less than 0.1.  CBC showed leukocytosis of 18.6 with neutrophilia.  UA was positive for UTI.  Urine culture was sent as well as blood cultures. EKG as reviewed by me : Atrial fibrillation with RVR with a rate of 146 with Q waves anteriorly. 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  Abdominal and pelvic CT scan revealed prostate enlargement, colonic diverticulosis, stable left  adrenal nodule, stable partially calcified mesenteric masses with no acute localizing process in the abdomen or pelvis.  The patient was given 1 L bolus of IV normal saline and 1.5 L bolus of IV lactated Ringer, 2.5 mg of IV Lopressor and 2 g of IV Rocephin.  He was started on IV heparin with bolus and drip.  He will be admitted to a progressive unit bed for further evaluation and management. PAST MEDICAL HISTORY:   Past Medical History:  Diagnosis Date   Diabetes mellitus without complication ([place])    History of kidney stones    Hyperlipidemia    Hypertension     PAST SURGICAL HISTORY:   Past Surgical History:  Procedure Laterality Date   KNEE SURGERY Bilateral    arthroscopic    SOCIAL HISTORY:   Social History   Tobacco Use   Smoking status: Never   Smokeless tobacco: Current    Types: Chew  Substance Use Topics   Alcohol use: Not Currently    FAMILY HISTORY:   Family History  Problem Relation Age of Onset   Diabetes Father    Heart disease Father    Diabetes Mother     DRUG ALLERGIES:   Allergies  Allergen Reactions   Amlodipine Swelling   Flomax [Tamsulosin] Swelling    Pt states swelling to face and swelling to lower extremities   Hydrochlorothiazide Other (See Comments)    Urinary frequency   Prednisone Other (See Comments)    steroids   Tetracycline  Other reaction(s): Unknown   Tetracyclines & Related    Tramadol Other (See Comments)    Keeps him awake    REVIEW OF SYSTEMS:   ROS As per history of present illness. All pertinent systems were reviewed above. Constitutional, HEENT, cardiovascular, respiratory, GI, GU, musculoskeletal, neuro, psychiatric, endocrine, integumentary and hematologic systems were reviewed and are otherwise negative/unremarkable except for positive findings mentioned above in the HPI.   MEDICATIONS AT HOME:   Prior to Admission medications   Medication Sig Start Date End Date Taking? Authorizing Provider   Acetaminophen (TYLENOL 8 HOUR PO) Take by mouth daily.   Yes [provider]  atenolol (TENORMIN) 25 MG tablet Take 1 tablet (25 mg total) by mouth 2 (two) times daily. 06/24/22  Yes Johnson, Megan P, DO  atorvastatin (LIPITOR) 20 MG tablet Take 1 tablet (20 mg total) by mouth daily. 06/24/22  Yes Johnson, Megan P, DO  benazepril (LOTENSIN) 40 MG tablet Take 1 tablet (40 mg total) by mouth daily. 06/24/22  Yes Johnson, Megan P, DO  lansoprazole (PREVACID) 30 MG capsule Take 1 capsule (30 mg total) by mouth daily at 12 noon. 06/24/22  Yes Johnson, Megan P, DO  metFORMIN (GLUCOPHAGE) 500 MG tablet Take 2 tablets (1,000 mg total) by mouth 2 (two) times daily with a meal. 06/24/22  Yes Johnson, Megan P, DO  Misc Natural Products (OSTEO BI-FLEX/5-LOXIN ADVANCED PO) Take by mouth.   Yes [provider]  Multiple Vitamins-Minerals (CENTRUM SILVER ADULT 50+) TABS Take by mouth daily.   Yes [provider]  vitamin E 400 UNIT capsule Take 400 Units by mouth daily.   Yes [provider]  Zinc 50 MG CAPS Take 50 mg by mouth daily.   Yes [provider]  blood glucose meter kit and supplies KIT Dispense based on patient and insurance preference; One touch. Use up to three times daily as directed. (FOR ICD-9 250.00, 250.01). 04/17/20   Johnson, Megan P, DO  hydrALAZINE (APRESOLINE) 10 MG tablet Take 0.5 tablets (5 mg total) by mouth 3 (three) times daily. Patient not taking: Reported on 07/18/2022 06/27/22   Park Liter P, DO  Ibuprofen 200 MG CAPS Take by mouth.    [provider]  OneTouch Delica Lancets 39J MISC Apply topically. 05/10/20   [provider]  ONETOUCH ULTRA test strip 1 each 2 (two) times daily. 05/10/20   [provider]      VITAL SIGNS:  Blood pressure 127/78, pulse (!) 155, temperature 98.1 F (36.7 C), resp. rate (!) 24, height '5\' 7"'$  (1.702 m), weight 79.7 kg, SpO2 95 %.  PHYSICAL EXAMINATION:  Physical  Exam  GENERAL:  77 y.o.-year-old Caucasian male patient lying in the bed with no acute distress.  EYES: Pupils equal, round, reactive to light and accommodation. No scleral icterus. Extraocular muscles intact.  HEENT: Head atraumatic, normocephalic. Oropharynx and nasopharynx clear.  NECK:  Supple, no jugular venous distention. No thyroid enlargement, no tenderness.  LUNGS: Normal breath sounds bilaterally, no wheezing, rales,rhonchi or crepitation. No use of accessory muscles of respiration.  CARDIOVASCULAR: Irregularly irregular rhythm, S1, S2 normal. No murmurs, rubs, or gallops.  ABDOMEN: Soft, nondistended, nontender. Bowel sounds present. No organomegaly or mass.  EXTREMITIES: No pedal edema, cyanosis, or clubbing.  NEUROLOGIC: Cranial nerves II through XII are intact. Muscle strength 5/5 in all extremities. Sensation intact. Gait not checked.  PSYCHIATRIC: The patient is alert and oriented x 3.  Normal affect and good eye contact. SKIN: No obvious  rash, lesion, or ulcer.   LABORATORY PANEL:   CBC Recent Labs  Lab 08/01/22 0242  WBC 12.5*  HGB 11.7*  HCT 34.8*  PLT 188   ------------------------------------------------------------------------------------------------------------------  Chemistries  Recent Labs  Lab 07/11/2022 1747  NA 138  K 3.6  CL 104  CO2 18*  GLUCOSE 203*  BUN 35*  CREATININE 1.12  CALCIUM 8.5*  MG 1.8  AST 114*  ALT 42  ALKPHOS 65  BILITOT 0.9   ------------------------------------------------------------------------------------------------------------------  Cardiac Enzymes No results for input(s): "TROPONINI" in the last 168 hours. ------------------------------------------------------------------------------------------------------------------  RADIOLOGY:  CT ABDOMEN PELVIS W CONTRAST  Result Date: 07/10/2022 CLINICAL DATA:  Abdominal pain. 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 EXAM: CT ABDOMEN AND PELVIS  WITH CONTRAST TECHNIQUE: Multidetector CT imaging of the abdomen and pelvis was performed using the standard protocol following bolus administration of intravenous contrast. RADIATION DOSE REDUCTION: This exam was performed according to the departmental dose-optimization program which includes automated exposure control, adjustment of the mA and/or kV according to patient size and/or use of iterative reconstruction technique. CONTRAST:  110m OMNIPAQUE IOHEXOL 300 MG/ML  SOLN COMPARISON:  PET-CT 05/21/2021.  CT abdomen and pelvis 01/28/2022. FINDINGS: Lower chest: No acute abnormality. Hepatobiliary: No focal liver abnormality is seen. No gallstones, gallbladder wall thickening, or biliary dilatation. Pancreas: Unremarkable. No pancreatic ductal dilatation or surrounding inflammatory changes. Spleen: Normal in size without focal abnormality. Adrenals/Urinary Tract: Left adrenal nodularity measuring up to 15 mm is unchanged. There is no hydronephrosis or perinephric fat stranding. There are bilateral renal cortical cysts measuring up to 16 mm, unchanged. The right adrenal gland and bladder are within normal limits. Stomach/Bowel: Stomach is within normal limits. Appendix appears normal. No evidence of bowel wall thickening, distention, or inflammatory changes. There is diffuse colonic diverticulosis. Partially calcified mesenteric mass adjacent to the transverse colon is stable measuring 3.6 x 5.3 cm. Stable right central mesenteric partially calcified mass measuring 2.8 x 1.5 cm. No new masses are seen. Vascular/Lymphatic: Aortic atherosclerosis. No enlarged abdominal or pelvic lymph nodes. Reproductive: Prostate is enlarged. Other: There is no ascites or free air. There is a small fat containing umbilical hernia. Increased density in the right inguinal canal may represent high riding testicle. Musculoskeletal: No acute or significant osseous findings. IMPRESSION: 1. No acute localizing process in the abdomen or  pelvis. 2. Stable partially calcified mesenteric masses. 3. Stable left adrenal nodule. 4. Colonic diverticulosis. 5. Prostate enlargement. Aortic Atherosclerosis (ICD10-I70.0). Electronically Signed   By: ARonney AstersM.D.   On: 07/26/2022 20:08   DG Chest Portable 1 View  Result Date: 07/27/2022 CLINICAL DATA:  Patient found on the floor. EXAM: PORTABLE CHEST 1 VIEW COMPARISON:  None Available. FINDINGS: The cardiac silhouette is borderline in size. There is moderate severity calcification of the aortic arch. Mild prominence of the bilateral infrahilar pulmonary vasculature is seen. There is no evidence of an acute infiltrate, pleural effusion or pneumothorax. The visualized skeletal structures are unremarkable. IMPRESSION: Findings consistent with mild pulmonary vascular congestion. Electronically Signed   By: TVirgina NorfolkM.D.   On: 07/09/2022 20:03   CT HEAD WO CONTRAST (5MM)  Result Date: 07/10/2022 CLINICAL DATA:  Altered mental status. EXAM: CT HEAD WITHOUT CONTRAST TECHNIQUE: Contiguous axial images were obtained from the base of the skull through the vertex without intravenous contrast. RADIATION DOSE REDUCTION: This exam was performed according to the departmental dose-optimization program which includes automated exposure control, adjustment of the mA and/or kV according to patient size  and/or use of iterative reconstruction technique. COMPARISON:  None Available. FINDINGS: Brain: There is mild cerebral atrophy with widening of the extra-axial spaces and ventricular dilatation. There are areas of decreased attenuation within the white matter tracts of the supratentorial brain, consistent with microvascular disease changes. 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 Vascular: There is marked severity calcification of the bilateral cavernous carotid arteries. Skull: Normal. Negative for fracture or focal lesion. Sinuses/Orbits: Mild left maxillary sinus and mild bilateral  ethmoid sinus mucosal thickening is seen. Chronic and/or postoperative changes are seen involving the medial wall of the right maxillary sinus. Other: None. IMPRESSION: 1. No acute intracranial abnormality. 2. Generalized cerebral atrophy with chronic white matter small vessel ischemic changes. 3. 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 4. Mild left maxillary sinus and bilateral ethmoid sinus disease. Electronically Signed   By: Virgina Norfolk M.D.   On: 07/15/2022 19:51      IMPRESSION AND PLAN:  Assessment and Plan: * Sepsis due to gram-negative UTI [facility]) - The patient will be admitted to a progressive unit bed. - We will continue hydration with IV normal saline. - Sepsis manifested by leukocytosis, tachycardia and tachypnea. - Will continue antibiotic therapy with IV Rocephin. - We will follow blood and urine cultures. - This could be the culprit for his fall.  Rhabdomyolysis - This is clearly secondary to his fall and staying on the ground for a few days. - Will aggressively hydrate and follow muscle enzymes. - It is likely the culprit for his elevated troponin I.  Elevated troponin I level - He was started on IV heparin for now. - Cardiology consult to be obtained. - We will need to assess for non-STEMI that could have led to his fall. - We will follow serial troponins. - Will obtain a 2D echo and cardiology consult. - I notified Dr. Curt Bears about the patient. - Will be placed on as needed IV morphine sulfate and sublingual nitroglycerin as needed if he develops pain.  Paroxysmal atrial fibrillation with RVR ([place]) - The patient will be placed on IV heparin. - His rate was controlled with IV Lopressor. - We will continue atenolol.  Dyslipidemia - Given the high likelihood that of acute rhabdomyolysis over NSTEMI we will have to hold off statin therapy..  Type 2 diabetes mellitus without complications ([place]) - We will place on supplement coverage with NovoLog. -  We will hold off metformin.  Essential hypertension - We will continue his antihypertensives     DVT prophylaxis: IV heparin Advanced Care Planning:  Code Status: full code.  Family Communication:  The plan of care was discussed in details with the patient (and family). I answered all questions. The patient agreed to proceed with the above mentioned plan. Further management will depend upon hospital course. Disposition Plan: Back to previous home environment Consults called: Cardiology All the records are reviewed and case discussed with ED provider.  Status is: Inpatient   At the time of the admission, it appears that the appropriate admission status for this patient is inpatient.  This is judged to be reasonable and necessary in order to provide the required intensity of service to ensure the patient's safety given the presenting symptoms, physical exam findings and initial radiographic and laboratory data in the context of comorbid conditions.  The patient requires inpatient status due to high intensity of service, high risk of further deterioration and high frequency of surveillance required.  I certify that at the time of admission,  it is my clinical judgment that the patient will require inpatient hospital care extending more than 2 midnights.                            Dispo: The patient is from: Home              Anticipated d/c is to: Home              Patient currently is not medically stable to d/c.              Difficult to place patient: No  Christel Mormon M.D on 08/01/2022 at 3:27 AM  [facility]   From 7 PM-7 AM, contact night-coverage [email or web address]  CC: Primary care physician; Valerie Roys, DO

## 2022-08-01 NOTE — Assessment & Plan Note (Addendum)
Treated with IV fluids during the hospital course

## 2022-08-01 NOTE — TOC Benefit Eligibility Note (Signed)
Patient Teacher, English as a foreign language completed.    The patient is currently admitted and upon discharge could be taking Eliquis 5 mg.  The current 30 day co-pay is $47.00.   The patient is insured through [place] Patient Advocate Specialist [place] Patient Advocate Team Direct Number: [phone]  Fax: [phone]

## 2022-08-01 NOTE — Assessment & Plan Note (Deleted)
-  This is clearly secondary to his fall and staying on the ground for a few days. - Will aggressively hydrate and follow muscle enzymes. - It is likely the culprit for his elevated troponin I.

## 2022-08-01 NOTE — Assessment & Plan Note (Addendum)
Unable to give anticoagulation with retroperitoneal bleed.

## 2022-08-01 NOTE — Assessment & Plan Note (Deleted)
-  He was started on IV heparin for now. - Cardiology consult to be obtained. - We will need to assess for non-STEMI that could have led to his fall. - We will follow serial troponins. - Will obtain a 2D echo and cardiology consult. - I notified Dr. Curt Bears about the patient. - Will be placed on as needed IV morphine sulfate and sublingual nitroglycerin as needed if he develops pain.

## 2022-08-01 NOTE — ED Notes (Signed)
Pharmacy contacted x2 for meds

## 2022-08-01 NOTE — Assessment & Plan Note (Addendum)
Patient on isosorbide dinitrate and as needed Lopressor

## 2022-08-01 NOTE — Consult Note (Signed)
Cardiology Consultation   Patient ID: Russell Byrd MRN: [id number]; DOB: 1946/06/16  Admit date: 07/10/2022 Date of Consult: 08/01/2022  PCP:  Valerie Roys, DO   [place] Providers Cardiologist:new to [facility] Physician requesting consult: Dr. Sidney Ace Reason for consult: Non-STEMI  Patient Profile:   Russell Byrd is a 77 y.o. male with a hx of GERD, hypertension, hyperlipidemia, gait instability, diabetes type 2 presenting to the hospital after being found down for 3 days, I UTI, atrial fibrillation with RVR, elevated troponi  History of Present Illness:   [name] reports that he has chronic baseline gait instability.  Wellness check performed July 31, 2022, last heard from on Sunday, January 21.  He was found covered in urine, responsive but not oriented, reports feeling dizzy recently, found on the floor laying on his side.  In the emergency room noted to have UTI, elevated troponin 1500 repeat 2100, atrial fibrillation with RVR rate 150 Given heparin, boluses of fluids, metoprolol IV for rate control, started on antibiotics N.p.o. through much of yesterday, requesting something to drink and food this morning Reports all his muscles are sore from crawling on the ground for 3 days Telemetry reviewed, this morning in normal sinus rhythm, blood pressure stable Systolic pressures in the 120 range  Past Medical History:  Diagnosis Date   Diabetes mellitus without complication ([place])    History of kidney stones    Hyperlipidemia    Hypertension     Past Surgical History:  Procedure Laterality Date   KNEE SURGERY Bilateral    arthroscopic     Home Medications:  Prior to Admission medications   Medication Sig Start Date End Date Taking? Authorizing Provider  Acetaminophen (TYLENOL 8 HOUR PO) Take by mouth daily.   Yes [provider]  atenolol (TENORMIN) 25 MG tablet Take 1 tablet (25 mg total) by mouth 2 (two) times daily. 06/24/22  Yes  Johnson, Megan P, DO  atorvastatin (LIPITOR) 20 MG tablet Take 1 tablet (20 mg total) by mouth daily. 06/24/22  Yes Johnson, Megan P, DO  benazepril (LOTENSIN) 40 MG tablet Take 1 tablet (40 mg total) by mouth daily. 06/24/22  Yes Johnson, Megan P, DO  lansoprazole (PREVACID) 30 MG capsule Take 1 capsule (30 mg total) by mouth daily at 12 noon. 06/24/22  Yes Johnson, Megan P, DO  metFORMIN (GLUCOPHAGE) 500 MG tablet Take 2 tablets (1,000 mg total) by mouth 2 (two) times daily with a meal. 06/24/22  Yes Johnson, Megan P, DO  Misc Natural Products (OSTEO BI-FLEX/5-LOXIN ADVANCED PO) Take by mouth.   Yes [provider]  Multiple Vitamins-Minerals (CENTRUM SILVER ADULT 50+) TABS Take by mouth daily.   Yes [provider]  vitamin E 400 UNIT capsule Take 400 Units by mouth daily.   Yes [provider]  Zinc 50 MG CAPS Take 50 mg by mouth daily.   Yes [provider]  blood glucose meter kit and supplies KIT Dispense based on patient and insurance preference; One touch. Use up to three times daily as directed. (FOR ICD-9 250.00, 250.01). 04/17/20   Johnson, Megan P, DO  hydrALAZINE (APRESOLINE) 10 MG tablet Take 0.5 tablets (5 mg total) by mouth 3 (three) times daily. Patient not taking: Reported on 07/17/2022 06/27/22   Park Liter P, DO  Ibuprofen 200 MG CAPS Take by mouth.    [provider]  OneTouch Delica Lancets 68T MISC Apply topically. 05/10/20   [provider]  Donald Siva  test strip 1 each 2 (two) times daily. 05/10/20   [provider]    Inpatient Medications: Scheduled Meds:  aspirin EC  81 mg Oral Daily   Continuous Infusions:  sodium chloride 100 mL/hr at 08/01/22 0438   cefTRIAXone (ROCEPHIN)  IV     heparin 950 Units/hr (07/13/2022 2126)   PRN Meds: acetaminophen **OR** acetaminophen, magnesium hydroxide, ondansetron **OR** ondansetron (ZOFRAN) IV, traZODone  Allergies:    Allergies  Allergen Reactions    Amlodipine Swelling   Flomax [Tamsulosin] Swelling    Pt states swelling to face and swelling to lower extremities   Hydrochlorothiazide Other (See Comments)    Urinary frequency   Prednisone Other (See Comments)    steroids   Tetracycline     Other reaction(s): Unknown   Tetracyclines & Related    Tramadol Other (See Comments)    Keeps him awake    Social History:   Social History   Socioeconomic History   Marital status: Married    Spouse name: Not on file   Number of children: Not on file   Years of education: Not on file   Highest education level: Not on file  Occupational History   Not on file  Tobacco Use   Smoking status: Never   Smokeless tobacco: Current    Types: Chew  Vaping Use   Vaping Use: Never used  Substance and Sexual Activity   Alcohol use: Not Currently   Drug use: No   Sexual activity: Not Currently  Other Topics Concern   Not on file  Social History Narrative   Not on file   Social Determinants of Health   Financial Resource Strain: Not on file  Food Insecurity: Not on file  Transportation Needs: Not on file  Physical Activity: Not on file  Stress: Not on file  Social Connections: Not on file  Intimate Partner Violence: Not on file    Family History:   Family History  Problem Relation Age of Onset   Diabetes Father    Heart disease Father    Diabetes Mother      ROS:  Please see the history of present illness.  Review of Systems  HENT: Negative.    Musculoskeletal:  Positive for myalgias.  All other systems reviewed and are negative.   All other ROS reviewed and negative.     Physical Exam/Data:   Vitals:   08/01/22 0400 08/01/22 0500 08/01/22 0600 08/01/22 0700  BP: (!) 126/56 139/70 (!) 132/58 126/61  Pulse: 74 80 82 81  Resp: (!) 22 20 (!) 21 (!) 25  Temp:  98.2 F (36.8 C)    SpO2: 95% 100% 98% 98%  Weight:      Height:        Intake/Output Summary (Last 24 hours) at 08/01/2022 0845 Last data filed at  08/07/2022 1733 Gross per 24 hour  Intake 750 ml  Output --  Net 750 ml      07/08/2022    8:00 PM 06/24/2022    3:51 PM 05/27/2022    3:50 PM  Last 3 Weights  Weight (lbs) 175 lb 11.3 oz 175 lb 9.6 oz 172 lb 9.6 oz  Weight (kg) 79.7 kg 79.652 kg 78.291 kg     Body mass index is 27.52 kg/m.  General:  Well nourished, well developed, in no acute distress HEENT: normal Neck: no JVD Vascular: No carotid bruits; Distal pulses 2+ bilaterally Cardiac:  normal S1, S2; RRR; no murmur  Lungs:  clear to auscultation bilaterally, no wheezing, rhonchi or rales  Abd: soft, nontender, no hepatomegaly  Ext: no edema Musculoskeletal:  No deformities, BUE and BLE strength normal and equal Skin: warm and dry  Neuro:  CNs 2-12 intact, no focal abnormalities noted Psych:  Normal affect   EKG:  The EKG was personally reviewed and demonstrates:   Atrial fibrillation rate 132 bpm unable to exclude old anterior MI, ST depressions consistent with anterolateral ischemia, possibly rate related  Telemetry:  Telemetry was personally reviewed and demonstrates:   Normal sinus rhythm this morning January 25   Relevant CV Studies: Echocardiogram pending   Laboratory Data:  High Sensitivity Troponin:   Recent Labs  Lab 07/13/2022 1747 07/25/2022 1924 08/01/22 0545 08/01/22 0802  TROPONINIHS 1,589* 2,131* 2,602* 2,867*     Chemistry Recent Labs  Lab 07/10/2022 1747  NA 138  K 3.6  CL 104  CO2 18*  GLUCOSE 203*  BUN 35*  CREATININE 1.12  CALCIUM 8.5*  MG 1.8  GFRNONAA >60  ANIONGAP 16*    Recent Labs  Lab 07/13/2022 1747  PROT 7.5  ALBUMIN 3.7  AST 114*  ALT 42  ALKPHOS 65  BILITOT 0.9   Lipids No results for input(s): "CHOL", "TRIG", "HDL", "LABVLDL", "LDLCALC", "CHOLHDL" in the last 168 hours.  Hematology Recent Labs  Lab 07/19/2022 1747 08/01/22 0242  WBC 18.6* 12.5*  RBC 4.39 3.95*  HGB 13.0 11.7*  HCT 39.6 34.8*  MCV 90.2 88.1  MCH 29.6 29.6  MCHC 32.8 33.6  RDW 12.7  12.5  PLT 221 188   Thyroid No results for input(s): "TSH", "FREET4" in the last 168 hours.  BNPNo results for input(s): "BNP", "PROBNP" in the last 168 hours.  DDimer No results for input(s): "DDIMER" in the last 168 hours.   Radiology/Studies:  CT ABDOMEN PELVIS W CONTRAST  Result Date: 08/01/2022 CLINICAL DATA:  Abdominal pain. 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 EXAM: CT ABDOMEN AND PELVIS WITH CONTRAST TECHNIQUE: Multidetector CT imaging of the abdomen and pelvis was performed using the standard protocol following bolus administration of intravenous contrast. RADIATION DOSE REDUCTION: This exam was performed according to the departmental dose-optimization program which includes automated exposure control, adjustment of the mA and/or kV according to patient size and/or use of iterative reconstruction technique. CONTRAST:  148m OMNIPAQUE IOHEXOL 300 MG/ML  SOLN COMPARISON:  PET-CT 05/21/2021.  CT abdomen and pelvis 01/28/2022. FINDINGS: Lower chest: No acute abnormality. Hepatobiliary: No focal liver abnormality is seen. No gallstones, gallbladder wall thickening, or biliary dilatation. Pancreas: Unremarkable. No pancreatic ductal dilatation or surrounding inflammatory changes. Spleen: Normal in size without focal abnormality. Adrenals/Urinary Tract: Left adrenal nodularity measuring up to 15 mm is unchanged. There is no hydronephrosis or perinephric fat stranding. There are bilateral renal cortical cysts measuring up to 16 mm, unchanged. The right adrenal gland and bladder are within normal limits. Stomach/Bowel: Stomach is within normal limits. Appendix appears normal. No evidence of bowel wall thickening, distention, or inflammatory changes. There is diffuse colonic diverticulosis. Partially calcified mesenteric mass adjacent to the transverse colon is stable measuring 3.6 x 5.3 cm. Stable right central mesenteric partially calcified mass measuring 2.8 x 1.5 cm. No  new masses are seen. Vascular/Lymphatic: Aortic atherosclerosis. No enlarged abdominal or pelvic lymph nodes. Reproductive: Prostate is enlarged. Other: There is no ascites or free air. There is a small fat containing umbilical hernia. Increased density in the right inguinal canal may represent high riding testicle. Musculoskeletal: No acute or  significant osseous findings. IMPRESSION: 1. No acute localizing process in the abdomen or pelvis. 2. Stable partially calcified mesenteric masses. 3. Stable left adrenal nodule. 4. Colonic diverticulosis. 5. Prostate enlargement. Aortic Atherosclerosis (ICD10-I70.0). Electronically Signed   By: Ronney Asters M.D.   On: 07/13/2022 20:08   DG Chest Portable 1 View  Result Date: 07/25/2022 CLINICAL DATA:  Patient found on the floor. EXAM: PORTABLE CHEST 1 VIEW COMPARISON:  None Available. FINDINGS: The cardiac silhouette is borderline in size. There is moderate severity calcification of the aortic arch. Mild prominence of the bilateral infrahilar pulmonary vasculature is seen. There is no evidence of an acute infiltrate, pleural effusion or pneumothorax. The visualized skeletal structures are unremarkable. IMPRESSION: Findings consistent with mild pulmonary vascular congestion. Electronically Signed   By: Virgina Norfolk M.D.   On: 07/21/2022 20:03   CT HEAD WO CONTRAST (5MM)  Result Date: 07/27/2022 CLINICAL DATA:  Altered mental status. EXAM: CT HEAD WITHOUT CONTRAST TECHNIQUE: Contiguous axial images were obtained from the base of the skull through the vertex without intravenous contrast. RADIATION DOSE REDUCTION: This exam was performed according to the departmental dose-optimization program which includes automated exposure control, adjustment of the mA and/or kV according to patient size and/or use of iterative reconstruction technique. COMPARISON:  None Available. FINDINGS: Brain: There is mild cerebral atrophy with widening of the extra-axial spaces and  ventricular dilatation. There are areas of decreased attenuation within the white matter tracts of the supratentorial brain, consistent with microvascular disease changes. 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 Vascular: There is marked severity calcification of the bilateral cavernous carotid arteries. Skull: Normal. Negative for fracture or focal lesion. Sinuses/Orbits: Mild left maxillary sinus and mild bilateral ethmoid sinus mucosal thickening is seen. Chronic and/or postoperative changes are seen involving the medial wall of the right maxillary sinus. Other: None. IMPRESSION: 1. No acute intracranial abnormality. 2. Generalized cerebral atrophy with chronic white matter small vessel ischemic changes. 3. 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 4. Mild left maxillary sinus and bilateral ethmoid sinus disease. Electronically Signed   By: Virgina Norfolk M.D.   On: 07/14/2022 19:51     Assessment and Plan:   Non-STEMI After being found down, rapid atrial fibrillation, urinary tract infection, rhabdo Troponin trending upwards now greater than 2600 Risk factors include hyperlipidemia, diabetes EKG concerning for anterolateral ischemia with ST depressions, unable to exclude old anterior MI Echocardiogram pending He prefers not to have catheterization today, prefers tomorrow after he has something to eat and drink today Tentatively scheduled for catheterization January 26 at 1230 with Dr. Fletcher Anon I have reviewed the risks, indications, and alternatives to cardiac catheterization, possible angioplasty, and stenting with the patient. Risks include but are not limited to bleeding, infection, vascular injury, stroke, myocardial infection, arrhythmia, kidney injury, radiation-related injury in the case of prolonged fluoroscopy use, emergency cardiac surgery, and death. The patient understands the risks of serious complication is 1-2 in 6073 with diagnostic cardiac cath and  1-2% or less with angioplasty/stenting.  He is in agreement -Aspirin, Lipitor 20, heparin, beta-blocker  2/atrial fibrillation with RVR On the ground for 3 days, UTI, presenting with A-fib rate 150 bpm Has converted to normal sinus rhythm, continue heparin Will start metoprolol to tartrate 25 twice daily, continue heparin infusion Will likely need NOAC at discharge  3.  Urinary tract infection On broad-spectrum antibiotics, blood pressure stable Will continue to monitor renal function, high risk for ATN  4.  Rhabdomyolysis On the ground  for 3 days at home Reports gait instability at baseline Will need PT OT  5. GERD Will continue home PPI   Total encounter time more than 80 minutes  Greater than 50% was spent in counseling and coordination of care with the patient   For questions or updates, please contact [place] Please consult [email or web address] for contact info under    Signed, Ida Rogue, MD  08/01/2022 8:45 AM

## 2022-08-01 NOTE — Assessment & Plan Note (Addendum)
Sliding scale coverage.  Tube feeding changed to Glucerna.

## 2022-08-01 NOTE — Assessment & Plan Note (Addendum)
Holding off cholesterol medication at this time.

## 2022-08-02 DIAGNOSIS — R410 Disorientation, unspecified: Secondary | ICD-10-CM

## 2022-08-02 DIAGNOSIS — A4150 Gram-negative sepsis, unspecified: Secondary | ICD-10-CM | POA: Diagnosis not present

## 2022-08-02 DIAGNOSIS — M6282 Rhabdomyolysis: Secondary | ICD-10-CM | POA: Diagnosis not present

## 2022-08-02 DIAGNOSIS — I4891 Unspecified atrial fibrillation: Secondary | ICD-10-CM | POA: Diagnosis not present

## 2022-08-02 DIAGNOSIS — I214 Non-ST elevation (NSTEMI) myocardial infarction: Secondary | ICD-10-CM | POA: Diagnosis not present

## 2022-08-02 DIAGNOSIS — N390 Urinary tract infection, site not specified: Secondary | ICD-10-CM | POA: Diagnosis not present

## 2022-08-02 LAB — GLUCOSE, CAPILLARY
Glucose-Capillary: 167 mg/dL — ABNORMAL HIGH (ref 70–99)
Glucose-Capillary: 127 mg/dL — ABNORMAL HIGH (ref 70–99)
Glucose-Capillary: 217 mg/dL — ABNORMAL HIGH (ref 70–99)
Glucose-Capillary: 172 mg/dL — ABNORMAL HIGH (ref 70–99)
Glucose-Capillary: 150 mg/dL — ABNORMAL HIGH (ref 70–99)

## 2022-08-02 LAB — CBC
HCT: 30.8 % — ABNORMAL LOW (ref 39.0–52.0)
Hemoglobin: 10.4 g/dL — ABNORMAL LOW (ref 13.0–17.0)
MCH: 29.8 pg (ref 26.0–34.0)
MCHC: 33.8 g/dL (ref 30.0–36.0)
MCV: 88.3 fL (ref 80.0–100.0)
Platelets: 172 10*3/uL (ref 150–400)
RBC: 3.49 MIL/uL — ABNORMAL LOW (ref 4.22–5.81)
RDW: 12.6 % (ref 11.5–15.5)
WBC: 9.6 10*3/uL (ref 4.0–10.5)
nRBC: 0 % (ref 0.0–0.2)

## 2022-08-02 LAB — COMPREHENSIVE METABOLIC PANEL
ALT: 36 U/L (ref 0–44)
AST: 64 U/L — ABNORMAL HIGH (ref 15–41)
Albumin: 2.8 g/dL — ABNORMAL LOW (ref 3.5–5.0)
Alkaline Phosphatase: 50 U/L (ref 38–126)
Anion gap: 7 (ref 5–15)
BUN: 29 mg/dL — ABNORMAL HIGH (ref 8–23)
CO2: 21 mmol/L — ABNORMAL LOW (ref 22–32)
Calcium: 7.6 mg/dL — ABNORMAL LOW (ref 8.9–10.3)
Chloride: 109 mmol/L (ref 98–111)
Creatinine, Ser: 1.07 mg/dL (ref 0.61–1.24)
GFR, Estimated: >60 mL/min (ref >60)
Glucose, Bld: 145 mg/dL — ABNORMAL HIGH (ref 70–99)
Potassium: 3.5 mmol/L (ref 3.5–5.1)
Sodium: 137 mmol/L (ref 135–145)
Total Bilirubin: 0.7 mg/dL (ref 0.3–1.2)
Total Protein: 5.8 g/dL — ABNORMAL LOW (ref 6.5–8.1)

## 2022-08-02 LAB — C-REACTIVE PROTEIN: CRP: 13.7 mg/dL — ABNORMAL HIGH (ref <1.0)

## 2022-08-02 LAB — CK: Total CK: 1735 U/L — ABNORMAL HIGH (ref 49–397)

## 2022-08-02 LAB — PHOSPHORUS: Phosphorus: 2.3 mg/dL — ABNORMAL LOW (ref 2.5–4.6)

## 2022-08-02 LAB — HEPARIN LEVEL (UNFRACTIONATED)
Heparin Unfractionated: 0.55 IU/mL (ref 0.30–0.70)
Heparin Unfractionated: 0.64 IU/mL (ref 0.30–0.70)

## 2022-08-02 LAB — MAGNESIUM: Magnesium: 1.9 mg/dL (ref 1.7–2.4)

## 2022-08-02 MED ORDER — GUAIFENESIN 100 MG/5ML PO LIQD
5.0000 mL | ORAL | Status: DC | PRN
  Administered 2022-08-02 – 2022-08-11 (×5): 5 mL via ORAL
  Filled 2022-08-02 (×6): qty 10

## 2022-08-02 MED ORDER — HALOPERIDOL LACTATE 5 MG/ML IJ SOLN
2.5000 mg | Freq: Once | INTRAMUSCULAR | Status: AC
  Administered 2022-08-02: 2.5 mg via INTRAVENOUS
  Filled 2022-08-02: qty 1

## 2022-08-02 MED ORDER — MENTHOL 3 MG MT LOZG
1.0000 | LOZENGE | OROMUCOSAL | Status: DC | PRN
  Administered 2022-08-03 – 2022-08-04 (×7): 3 mg via ORAL
  Filled 2022-08-02 (×4): qty 9

## 2022-08-02 MED ORDER — SODIUM CHLORIDE 0.9 % IV SOLN
1.0000 g | Freq: Two times a day (BID) | INTRAVENOUS | Status: DC
  Administered 2022-08-02 – 2022-08-03 (×2): 1 g via INTRAVENOUS
  Filled 2022-08-02 (×2): qty 10

## 2022-08-02 MED ORDER — SODIUM CHLORIDE 0.9 % IV SOLN
2.0000 g | Freq: Two times a day (BID) | INTRAVENOUS | Status: DC
  Filled 2022-08-02: qty 12.5

## 2022-08-02 MED ORDER — HYDRALAZINE HCL 25 MG PO TABS
25.0000 mg | ORAL_TABLET | Freq: Three times a day (TID) | ORAL | Status: DC
  Filled 2022-08-02: qty 1

## 2022-08-02 NOTE — TOC Progression Note (Signed)
Transition of Care [facility]) - Progression Note    Patient Details  Name: Russell Byrd MRN: [id number] Date of Birth: 01/30/1946  [facility] CM/SW Contact  Laurena Slimmer, RN Phone Number: 08/02/2022, 1:57 PM  Clinical Narrative:    Case reviewed for needs and disposition.         Expected Discharge Plan and Services                                               Social Determinants of Health (SDOH) Interventions SDOH Screenings   Depression (PHQ2-9): Low Risk  (05/27/2022)  Tobacco Use: High Risk (06/24/2022)    Readmission Risk Interventions     No data to display

## 2022-08-02 NOTE — Care Management Important Message (Signed)
Important Message  Patient Details  Name: Russell Byrd MRN: [id number] Date of Birth: 1945-11-15   Medicare Important Message Given:  Yes     Dannette Barbara 08/02/2022, 12:18 PM

## 2022-08-02 NOTE — Consult Note (Signed)
[place] for heparin infusion Indication: chest pain/ACS and atrial fibrillation  Allergies  Allergen Reactions   Amlodipine Swelling   Flomax [Tamsulosin] Swelling    Pt states swelling to face and swelling to lower extremities   Hydrochlorothiazide Other (See Comments)    Urinary frequency   Prednisone Other (See Comments)    steroids   Tetracycline     Other reaction(s): Unknown   Tetracyclines & Related    Tramadol Other (See Comments)    Keeps him awake    Patient Measurements: Height: '5\' 7"'$  (170.2 cm) Weight: 74.7 kg (164 lb 10.9 oz) IBW/kg (Calculated) : 66.1 Heparin Dosing Weight: 79.7 kg  Vital Signs: Temp: 99.4 F (37.4 C) (01/26 0000) Temp Source: Oral (01/26 0000) BP: 136/113 (01/26 0000) Pulse Rate: 78 (01/26 0000)  Labs: Recent Labs    07/23/2022 1747 08/06/2022 1924 08/01/22 0242 08/01/22 0545 08/01/22 0802 08/01/22 1248 08/02/22 0028  HGB 13.0  --  11.7*  --   --   --   --   HCT 39.6  --  34.8*  --   --   --   --   PLT 221  --  188  --   --   --   --   LABPROT  --   --  15.2  --   --   --   --   INR  --   --  1.2  --   --   --   --   HEPARINUNFRC  --   --  0.41  --   --  0.14* 0.64  CREATININE 1.12  --   --   --   --   --   --   CKTOTAL  --  4,431* 3,485*  --   --   --   --   CKMB  --   --  20.9*  --   --   --   --   TROPONINIHS 1,589* 2,131*  --  2,602* 2,867*  --   --      Estimated Creatinine Clearance: 52.5 mL/min (by C-G formula based on SCr of 1.12 mg/dL).   Medical History: Past Medical History:  Diagnosis Date   Diabetes mellitus without complication ([place])    History of kidney stones    Hyperlipidemia    Hypertension     Medications:  PTA: N/A Inpatient: Heparin infusion 1/24 >> Allergies: No AC/APT related allergies  Assessment: 77 year old male with PMH idabetes found on floor by EMS. Troponins increased from 1589 to 2131. EKG pending. Pharmacy consulted for heparin infusion in  the setting of suspected ACS. Pt was in afib and will likely need DOAC at discharge.   Goal of Therapy:  Heparin level 0.3-0.7 units/ml Monitor platelets by anticoagulation protocol: Yes  1/25 0242 HL 0.41, therapeutic x 1 1/25 1330 HL 0.14, subtherapeutic. ] 1/26 0028 HL 0.64, therapeutic X 1   Plan:  1/26: HL @ 0028 = 0.64, therapeutic X 1 Will continue pt on current rate and recheck HL on 1/26 @ 0800.   Charlei Ramsaran D 08/02/2022 12:56 AM

## 2022-08-02 NOTE — Progress Notes (Signed)
  Progress Note   Patient: Russell Byrd KZL:[id number] DOB: August 19, 1945 DOA: 07/20/2022     2 DOS: the patient was seen and examined on 08/02/2022   Brief hospital course:  Assessment and Plan:  * Sepsis due to gram-negative UTI ([place]) with enterobacter aerogenes - IV NS 100 cc/hr  - IV cefepime 2g q12   Rhabdomyolysis - CK downtrending  - IV fluids as above    NSTEMI/Elevated troponin I level - IV heparin drip with anti-coagulation monitoring by pharmacy  - Cardiac cath at cardiology's discretion  - ASA 81 mg PO daily  - Lipitor 20 mg PO daily - Lopressor 25 mg PO bid     Paroxysmal atrial fibrillation with RVR (HCC) - IV heparin drip  - Lopressor 25 mg PO bid    Dyslipidemia - Atorvastatin 20 mg PO daily    Type 2 diabetes mellitus without complications (HCC) - Novolog sliding scale tid and bedtime    Essential hypertension - Lopressor as above    DVT prophylaxis: IV heparin drip as above  GI prophylaxis: Protonix 20 mg PO dailyl       Subjective: Pt seen and examined at the bedside. Appears the cardiac cath has been postponed. Spoke with pharmacy and enterobacter aerogenes is growing in the urine cx, thus, will switch to IV cefepime.   Physical Exam: Vitals:   08/02/22 0801 08/02/22 0838 08/02/22 1000 08/02/22 1201  BP: (!) 177/82 (!) 167/75  (!) 147/59  Pulse: 82   74  Resp: 20 14 (!) 24 (!) 24  Temp: 99.5 F (37.5 C)   98.5 F (36.9 C)  TempSrc: Oral   Oral  SpO2: 95%   96%  Weight:      Height:       HENT:     Head: Normocephalic.     Mouth/Throat:     Mouth: Mucous membranes are moist.  Cardiovascular:     Rate and Rhythm: Normal rate and regular rhythm.  Pulmonary:     Effort: Pulmonary effort is normal.  Abdominal:     General: Abdomen is flat.     Palpations: Abdomen is soft.  Musculoskeletal:        General: No swelling.     Cervical back: Neck supple.  Skin:    General: Skin is warm and dry.  Neurological:     Mental Status: He is  alert. Mental status is at baseline.  Psychiatric:        Mood and Affect: Mood normal.   Data Reviewed:   Disposition: Status is: Inpatient  Planned Discharge Destination: Skilled nursing facility    Time spent: 35 minutes  Author: Lucienne Minks , MD 08/02/2022 1:16 PM  For on call review [email or web address].

## 2022-08-02 NOTE — Progress Notes (Signed)
CROSS COVER NOTE  NAME: Russell Byrd MRN: [id number] DOB : 24-Feb-1946    HPI/Events of Note   Nurse reports patient very agitated and combative with staff when trying to obtain IV access  Assessment and  Interventions   Assessment: Admitted with AMS and likely now delirium  Plan: Haldol 2.5 mg x1       Kathlene Cote NP [facility]

## 2022-08-02 NOTE — Progress Notes (Addendum)
Pt was admitted for Sepsis with a CK 3485. Pt need another peripheral IV for plan cardiac catherization today, but pt is agitated, kicking and swinging at staff during placement. Pt has an ordered for 0.9 sodium chloride 100 ml/hr but is paused at this time. Pt was trying to get up and staff were trying to placed pt back in the bed but pt is swinged at one staff. NP Randol Kern made aware. Will continue to monitor.  Update 0259: NP Randol Kern placed haldol 2.5 mg IV once. Will continue to monitor.   Update 0311: awaiting IV team.[id number] placed a new IV team order. Will continue to monitor.   Update 0349: Pt asleep at this time. Will continue to monitor.

## 2022-08-02 NOTE — Progress Notes (Signed)
Rounding Note    Patient Name: Russell Byrd Date of Encounter: 08/02/2022  [place] Cardiologist: None  New consult done by Dr. Rockey Situ Subjective   Patient seen on a.m. rounds.  Denies chest pain or shortness of breath.  Does have periods of confusion.  Through the night he was pulling off his heart monitor and pulled out his peripheral IVs.  This morning he remains in mittens.  Daughter remains at the bedside.  Inpatient Medications    Scheduled Meds:  aspirin EC  81 mg Oral Daily   atorvastatin  20 mg Oral Daily   insulin aspart  0-5 Units Subcutaneous QHS   insulin aspart  0-9 Units Subcutaneous TID WC   metoprolol tartrate  25 mg Oral BID   pantoprazole  20 mg Oral Daily   sodium chloride flush  3 mL Intravenous Q12H   Continuous Infusions:  sodium chloride 100 mL/hr at 08/02/22 1200   sodium chloride     sodium chloride 1 mL/kg/hr (08/02/22 1200)   ceFEPime (MAXIPIME) IV     heparin 1,150 Units/hr (08/02/22 1411)   PRN Meds: sodium chloride, acetaminophen **OR** acetaminophen, magnesium hydroxide, ondansetron **OR** ondansetron (ZOFRAN) IV, sodium chloride flush, traZODone   Vital Signs    Vitals:   08/02/22 1000 08/02/22 1201 08/02/22 1240 08/02/22 1415  BP:  (!) 147/59    Pulse:  74    Resp: (!) 24 (!) 24 (!) 21 (!) 24  Temp:  98.5 F (36.9 C)    TempSrc:  Oral    SpO2:  96%    Weight:      Height:        Intake/Output Summary (Last 24 hours) at 08/02/2022 1433 Last data filed at 08/02/2022 1200 Gross per 24 hour  Intake 2668.49 ml  Output 625 ml  Net 2043.49 ml      08/01/2022    9:15 PM 07/27/2022    8:00 PM 06/24/2022    3:51 PM  Last 3 Weights  Weight (lbs) 164 lb 10.9 oz 175 lb 11.3 oz 175 lb 9.6 oz  Weight (kg) 74.7 kg 79.7 kg 79.652 kg      Telemetry    Sinus rates in the 70s- Personally Reviewed  ECG    No new tracings completed at this time- Personally Reviewed  Physical Exam   GEN: No acute distress.   Neck:  No JVD Cardiac: RRR, no murmurs, rubs, or gallops.  Respiratory: Clear to auscultation bilaterally. GI: Soft, nontender, non-distended  MS: No edema; No deformity.  Has safety mittens on bilaterally Neuro:  Nonfocal  Psych: Normal affect, mild confusion  Labs    High Sensitivity Troponin:   Recent Labs  Lab 07/14/2022 1747 07/26/2022 1924 08/01/22 0545 08/01/22 0802  TROPONINIHS 1,589* 2,131* 2,602* 2,867*     Chemistry Recent Labs  Lab 07/15/2022 1747 08/02/22 0356  NA 138 137  K 3.6 3.5  CL 104 109  CO2 18* 21*  GLUCOSE 203* 145*  BUN 35* 29*  CREATININE 1.12 1.07  CALCIUM 8.5* 7.6*  MG 1.8 1.9  PROT 7.5 5.8*  ALBUMIN 3.7 2.8*  AST 114* 64*  ALT 42 36  ALKPHOS 65 50  BILITOT 0.9 0.7  GFRNONAA >60 >60  ANIONGAP 16* 7    Lipids No results for input(s): "CHOL", "TRIG", "HDL", "LABVLDL", "LDLCALC", "CHOLHDL" in the last 168 hours.  Hematology Recent Labs  Lab 07/29/2022 1747 08/01/22 0242 08/02/22 0356  WBC 18.6* 12.5* 9.6  RBC 4.39 3.95*  3.49*  HGB 13.0 11.7* 10.4*  HCT 39.6 34.8* 30.8*  MCV 90.2 88.1 88.3  MCH 29.6 29.6 29.8  MCHC 32.8 33.6 33.8  RDW 12.7 12.5 12.6  PLT 221 188 172   Thyroid No results for input(s): "TSH", "FREET4" in the last 168 hours.  BNPNo results for input(s): "BNP", "PROBNP" in the last 168 hours.  DDimer No results for input(s): "DDIMER" in the last 168 hours.   Radiology    ECHOCARDIOGRAM COMPLETE  Result Date: 08/01/2022    ECHOCARDIOGRAM REPORT   Patient Name:   FALON FLINCHUM Date of Exam: 08/01/2022 Medical Rec #:  [id number]        Height:       67.0 in Accession #:    [id number]       Weight:       175.7 lb Date of Birth:  Oct 15, 1945        BSA:          1.914 m Patient Age:    77 years         BP:           150/63 mmHg Patient Gender: M                HR:           76 bpm. Exam Location:  [facility] Procedure: 2D Echo Indications:     elevated troponin  History:         Patient has no prior history of Echocardiogram examinations.                   Arrythmias:Atrial Fibrillation; Risk Factors:Hypertension,                  Diabetes and Dyslipidemia.  Sonographer:     Harvie Junior Referring Phys:  [id number] [place] Diagnosing Phys: Ida Rogue MD IMPRESSIONS  1. Left ventricular ejection fraction, by estimation, is 55 to 60%. The left ventricle has normal function. The left ventricle has no regional wall motion abnormalities. There is mild left ventricular hypertrophy. Left ventricular diastolic parameters are indeterminate.  2. Right ventricular systolic function is normal. The right ventricular size is normal. There is normal pulmonary artery systolic pressure.  3. The mitral valve is normal in structure. Mild to moderate mitral valve regurgitation. No evidence of mitral stenosis.  4. The aortic valve is normal in structure. Aortic valve regurgitation is mild. No aortic stenosis is present.  5. The inferior vena cava is normal in size with greater than 50% respiratory variability, suggesting right atrial pressure of 3 mmHg. FINDINGS  Left Ventricle: Left ventricular ejection fraction, by estimation, is 55 to 60%. The left ventricle has normal function. The left ventricle has no regional wall motion abnormalities. The left ventricular internal cavity size was normal in size. There is  mild left ventricular hypertrophy. Left ventricular diastolic parameters are indeterminate. Right Ventricle: The right ventricular size is normal. No increase in right ventricular wall thickness. Right ventricular systolic function is normal. There is normal pulmonary artery systolic pressure. The tricuspid regurgitant velocity is 2.35 m/s, and  with an assumed right atrial pressure of 3 mmHg, the estimated right ventricular systolic pressure is 15.7 mmHg. Left Atrium: Left atrial size was normal in size. Right Atrium: Right atrial size was normal in size. Pericardium: There is no evidence of pericardial effusion. Mitral Valve: The mitral valve is normal in  structure. Mild to moderate mitral valve regurgitation. No evidence of mitral valve  stenosis. Tricuspid Valve: The tricuspid valve is normal in structure. Tricuspid valve regurgitation is mild . No evidence of tricuspid stenosis. Aortic Valve: The aortic valve is normal in structure. Aortic valve regurgitation is mild. Aortic regurgitation PHT measures 1346 msec. No aortic stenosis is present. Aortic valve mean gradient measures 3.5 mmHg. Aortic valve peak gradient measures 6.1 mmHg. Aortic valve area, by VTI measures 2.53 cm. Pulmonic Valve: The pulmonic valve was normal in structure. Pulmonic valve regurgitation is not visualized. No evidence of pulmonic stenosis. Aorta: The aortic root is normal in size and structure. Venous: The inferior vena cava is normal in size with greater than 50% respiratory variability, suggesting right atrial pressure of 3 mmHg. IAS/Shunts: No atrial level shunt detected by color flow Doppler.  LEFT VENTRICLE PLAX 2D LVIDd:         4.10 cm      Diastology LVIDs:         3.00 cm      LV e' medial:    5.91 cm/s LV PW:         1.30 cm      LV E/e' medial:  17.2 LV IVS:        1.20 cm      LV e' lateral:   11.25 cm/s LVOT diam:     2.00 cm      LV E/e' lateral: 9.1 LV SV:         55 LV SV Index:   29 LVOT Area:     3.14 cm  LV Volumes (MOD) LV vol d, MOD A2C: 63.2 ml LV vol d, MOD A4C: 119.0 ml LV vol s, MOD A2C: 30.2 ml LV vol s, MOD A4C: 59.1 ml LV SV MOD A2C:     33.0 ml LV SV MOD A4C:     119.0 ml LV SV MOD BP:      43.9 ml RIGHT VENTRICLE RV Basal diam:  3.60 cm RV Mid diam:    2.70 cm RV S prime:     17.17 cm/s TAPSE (M-mode): 2.1 cm LEFT ATRIUM             Index        RIGHT ATRIUM           Index LA diam:        4.00 cm 2.09 cm/m   RA Area:     15.50 cm LA Vol (A2C):   61.2 ml 31.98 ml/m  RA Volume:   34.10 ml  17.82 ml/m LA Vol (A4C):   65.5 ml 34.22 ml/m LA Biplane Vol: 66.5 ml 34.74 ml/m  AORTIC VALVE                    PULMONIC VALVE AV Area (Vmax):    2.38 cm     PV  Vmax:          1.17 m/s AV Area (Vmean):   2.33 cm     PV Peak grad:     5.5 mmHg AV Area (VTI):     2.53 cm     PR End Diast Vel: 2.61 msec AV Vmax:           123.00 cm/s AV Vmean:          83.000 cm/s AV VTI:            0.218 m AV Peak Grad:      6.1 mmHg AV Mean Grad:      3.5 mmHg LVOT  Vmax:         93.30 cm/s LVOT Vmean:        61.500 cm/s LVOT VTI:          0.175 m LVOT/AV VTI ratio: 0.80 AI PHT:            1346 msec  AORTA Ao Root diam: 3.30 cm MITRAL VALVE                TRICUSPID VALVE MV Area (PHT): 3.85 cm     TR Peak grad:   22.1 mmHg MV Decel Time: 197 msec     TR Vmax:        235.00 cm/s MR Peak grad: 105.6 mmHg MR Mean grad: 74.0 mmHg     SHUNTS MR Vmax:      513.80 cm/s   Systemic VTI:  0.18 m MR Vmean:     408.0 cm/s    Systemic Diam: 2.00 cm MV E velocity: 102.00 cm/s MV A velocity: 37.70 cm/s MV E/A ratio:  2.71 Ida Rogue MD Electronically signed by Ida Rogue MD Signature Date/Time: 08/01/2022/4:15:13 PM    Final    DG Shoulder Left  Result Date: 08/01/2022 CLINICAL DATA:  Bilateral shoulder pain. EXAM: LEFT SHOULDER - 2+ VIEW COMPARISON:  None Available. FINDINGS: No fracture or dislocation. Mild osteoarthritis of the glenohumeral joint. Mild degenerative changes of the acromioclavicular joint. Soft tissues are normal. IMPRESSION: 1.  No acute osseous injury of the left shoulder. Electronically Signed   By: Kathreen Devoid M.D.   On: 08/01/2022 12:36   DG Shoulder Right  Result Date: 08/01/2022 CLINICAL DATA:  Is patient found on ground, bilateral shoulder pain EXAM: RIGHT SHOULDER - 2+ VIEW COMPARISON:  Chest radiograph 07/08/2022 FINDINGS: Moderate spurring and degenerative chondral thinning in the glenohumeral joint. Mild spurring of the [facility] joint. Subacromial morphology is type 2 (curved). No fracture, malalignment, or acute bony findings. IMPRESSION: 1. Moderate osteoarthritis of the glenohumeral joint. Mild spurring of the [facility] joint. Electronically Signed   By: Van Clines M.D.   On: 08/01/2022 12:36   CT ABDOMEN PELVIS W CONTRAST  Result Date: 07/13/2022 CLINICAL DATA:  Abdominal pain. 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 EXAM: CT ABDOMEN AND PELVIS WITH CONTRAST TECHNIQUE: Multidetector CT imaging of the abdomen and pelvis was performed using the standard protocol following bolus administration of intravenous contrast. RADIATION DOSE REDUCTION: This exam was performed according to the departmental dose-optimization program which includes automated exposure control, adjustment of the mA and/or kV according to patient size and/or use of iterative reconstruction technique. CONTRAST:  144m OMNIPAQUE IOHEXOL 300 MG/ML  SOLN COMPARISON:  PET-CT 05/21/2021.  CT abdomen and pelvis 01/28/2022. FINDINGS: Lower chest: No acute abnormality. Hepatobiliary: No focal liver abnormality is seen. No gallstones, gallbladder wall thickening, or biliary dilatation. Pancreas: Unremarkable. No pancreatic ductal dilatation or surrounding inflammatory changes. Spleen: Normal in size without focal abnormality. Adrenals/Urinary Tract: Left adrenal nodularity measuring up to 15 mm is unchanged. There is no hydronephrosis or perinephric fat stranding. There are bilateral renal cortical cysts measuring up to 16 mm, unchanged. The right adrenal gland and bladder are within normal limits. Stomach/Bowel: Stomach is within normal limits. Appendix appears normal. No evidence of bowel wall thickening, distention, or inflammatory changes. There is diffuse colonic diverticulosis. Partially calcified mesenteric mass adjacent to the transverse colon is stable measuring 3.6 x 5.3 cm. Stable right central mesenteric partially calcified mass measuring 2.8 x 1.5 cm. No new masses are seen.  Vascular/Lymphatic: Aortic atherosclerosis. No enlarged abdominal or pelvic lymph nodes. Reproductive: Prostate is enlarged. Other: There is no ascites or free air. There is a small fat  containing umbilical hernia. Increased density in the right inguinal canal may represent high riding testicle. Musculoskeletal: No acute or significant osseous findings. IMPRESSION: 1. No acute localizing process in the abdomen or pelvis. 2. Stable partially calcified mesenteric masses. 3. Stable left adrenal nodule. 4. Colonic diverticulosis. 5. Prostate enlargement. Aortic Atherosclerosis (ICD10-I70.0). Electronically Signed   By: Ronney Asters M.D.   On: 08/03/2022 20:08   DG Chest Portable 1 View  Result Date: 08/01/2022 CLINICAL DATA:  Patient found on the floor. EXAM: PORTABLE CHEST 1 VIEW COMPARISON:  None Available. FINDINGS: The cardiac silhouette is borderline in size. There is moderate severity calcification of the aortic arch. Mild prominence of the bilateral infrahilar pulmonary vasculature is seen. There is no evidence of an acute infiltrate, pleural effusion or pneumothorax. The visualized skeletal structures are unremarkable. IMPRESSION: Findings consistent with mild pulmonary vascular congestion. Electronically Signed   By: Virgina Norfolk M.D.   On: 07/28/2022 20:03   CT HEAD WO CONTRAST (5MM)  Result Date: 07/11/2022 CLINICAL DATA:  Altered mental status. EXAM: CT HEAD WITHOUT CONTRAST TECHNIQUE: Contiguous axial images were obtained from the base of the skull through the vertex without intravenous contrast. RADIATION DOSE REDUCTION: This exam was performed according to the departmental dose-optimization program which includes automated exposure control, adjustment of the mA and/or kV according to patient size and/or use of iterative reconstruction technique. COMPARISON:  None Available. FINDINGS: Brain: There is mild cerebral atrophy with widening of the extra-axial spaces and ventricular dilatation. There are areas of decreased attenuation within the white matter tracts of the supratentorial brain, consistent with microvascular disease changes. Small chronic right basal ganglia lacunar  infarcts are noted. Vascular: There is marked severity calcification of the bilateral cavernous carotid arteries. Skull: Normal. Negative for fracture or focal lesion. Sinuses/Orbits: Mild left maxillary sinus and mild bilateral ethmoid sinus mucosal thickening is seen. Chronic and/or postoperative changes are seen involving the medial wall of the right maxillary sinus. Other: None. IMPRESSION: 1. No acute intracranial abnormality. 2. Generalized cerebral atrophy with chronic white matter small vessel ischemic changes. 3. 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 4. Mild left maxillary sinus and bilateral ethmoid sinus disease. Electronically Signed   By: Virgina Norfolk M.D.   On: 07/29/2022 19:51    Cardiac Studies  TTE 08/01/22 1. Left ventricular ejection fraction, by estimation, is 55 to 60%. The  left ventricle has normal function. The left ventricle has no regional  wall motion abnormalities. There is mild left ventricular hypertrophy.  Left ventricular diastolic parameters  are indeterminate.   2. Right ventricular systolic function is normal. The right ventricular  size is normal. There is normal pulmonary artery systolic pressure.   3. The mitral valve is normal in structure. Mild to moderate mitral valve  regurgitation. No evidence of mitral stenosis.   4. The aortic valve is normal in structure. Aortic valve regurgitation is  mild. No aortic stenosis is present.   5. The inferior vena cava is normal in size with greater than 50%  respiratory variability, suggesting right atrial pressure of 3 mmHg.   Patient Profile     77 y.o. male with a history of gastroesophageal reflux disease, hypertension, hyperlipidemia, gait instability, type 2 diabetes, who is being seen and evaluated for atrial fibrillation with RVR and elevated high-sensitivity troponin  Assessment & Plan  NSTEMI -High-sensitivity troponins trended and peaked at 2867 -EKG was concerning for anterolateral  ischemia with ST depression -Continued on aspirin statin, heparin, and beta-blocker therapy -chest pain-free today -Heart catheterization is being rescheduled until Monday, due to mild confusion, the requirement of mittens as he had pulled out IVs and leads from his heart monitor, long discussion had with patient and with the daughter who is in agreement with postponing procedure until at the beginning of the week -Telemetry monitoring -Heart healthy carb modified diet and then n.p.o. at midnight Sunday -EKG as needed changes or pain  Atrial fibrillation RVR -Found down at home on the ground for 3 days, with UTI, presented with A-fib 150 bpm -Converted to sinus rhythm -Continue on heparin drip until after heart catheterization then will likely transition to Lilly prior to discharge -CHA2DS2-VASc score of at least -Continued on metoprolol tartrate 25 mg twice daily  Rhabdomyolysis -Was down for approximately 3 days -CK total trending down this morning 3485 -Continues to have muscle aches and pain to his bilateral shoulders and chest wall on mild palpation -Will likely need PT/OT  UTI -Continued on broad-spectrum antibiotics -Daily BMP to monitor renal function currently high risk for ATN  Gastroesophageal reflux disease -Continue home PPI  6.   Type 2 diabetes -Management per IM  For questions or updates, please contact [place] Please consult [email or web address] for contact info under        Signed, Taegan Standage, NP  08/02/2022, 2:33 PM

## 2022-08-02 NOTE — Consult Note (Signed)
ANTICOAGULATION CONSULT NOTE   Pharmacy Consult for Heparin Infusion Indication: chest pain/ACS and atrial fibrillation  Patient Measurements: Height: '5\' 7"'$  (170.2 cm) Weight: 74.7 kg (164 lb 10.9 oz) IBW/kg (Calculated) : 66.1 Heparin Dosing Weight: 79.7 kg  Labs: Recent Labs    08/03/2022 1747 07/15/2022 1747 07/13/2022 1924 08/01/22 0242 08/01/22 0545 08/01/22 0802 08/01/22 1248 08/02/22 0028 08/02/22 0356 08/02/22 0851  HGB 13.0  --   --  11.7*  --   --   --   --  10.4*  --   HCT 39.6  --   --  34.8*  --   --   --   --  30.8*  --   PLT 221  --   --  188  --   --   --   --  172  --   LABPROT  --   --   --  15.2  --   --   --   --   --   --   INR  --   --   --  1.2  --   --   --   --   --   --   HEPARINUNFRC  --    < >  --  0.41  --   --  0.14* 0.64  --  0.55  CREATININE 1.12  --   --   --   --   --   --   --  1.07  --   CKTOTAL  --   --  4,431* 3,485*  --   --   --   --  1,735*  --   CKMB  --   --   --  20.9*  --   --   --   --   --   --   TROPONINIHS 1,589*  --  2,131*  --  2,602* 2,867*  --   --   --   --    < > = values in this interval not displayed.     Estimated Creatinine Clearance: 54.9 mL/min (by C-G formula based on SCr of 1.07 mg/dL).   Medical History: Past Medical History:  Diagnosis Date   Diabetes mellitus without complication ([place])    History of kidney stones    Hyperlipidemia    Hypertension    Medications:  PTA: N/A Inpatient: Heparin infusion 1/24 >> Allergies: No AC/APT related allergies  Assessment: 77 year old male with PMH idabetes found on floor by EMS. Troponins increased from 1589 to 2131. EKG pending. Pharmacy consulted for heparin infusion in the setting of suspected ACS. Pt was in afib and will likely need DOAC at discharge.   Goal of Therapy:  Heparin level 0.3-0.7 units/ml Monitor platelets by anticoagulation protocol: Yes  1/25 0242 HL 0.41, therapeutic x 1 1/25 1330 HL 0.14, subtherapeutic 1/26 0028 HL 0.64, therapeutic x  1  1/26 0851 HL 0.55, therapeutic x 2  Plan:  --Heparin level is therapeutic x 2 --Continue heparin infusion at 1150 units/hr --Re-check HL tomorrow AM --Daily CBC per protocol while on IV heparin  Benita Gutter 08/02/2022 9:50 AM

## 2022-08-02 NOTE — Plan of Care (Signed)
  Problem: Respiratory: Goal: Ability to maintain adequate ventilation will improve Outcome: Progressing   Problem: Cardiovascular: Goal: Ability to achieve and maintain adequate cardiovascular perfusion will improve Outcome: Progressing   Problem: Safety: Goal: Ability to remain free from injury will improve Outcome: Progressing

## 2022-08-03 DIAGNOSIS — I4891 Unspecified atrial fibrillation: Secondary | ICD-10-CM | POA: Diagnosis not present

## 2022-08-03 DIAGNOSIS — E7800 Pure hypercholesterolemia, unspecified: Secondary | ICD-10-CM

## 2022-08-03 DIAGNOSIS — N390 Urinary tract infection, site not specified: Secondary | ICD-10-CM | POA: Diagnosis not present

## 2022-08-03 DIAGNOSIS — A4150 Gram-negative sepsis, unspecified: Secondary | ICD-10-CM | POA: Diagnosis not present

## 2022-08-03 DIAGNOSIS — I214 Non-ST elevation (NSTEMI) myocardial infarction: Secondary | ICD-10-CM | POA: Diagnosis not present

## 2022-08-03 LAB — GLUCOSE, CAPILLARY
Glucose-Capillary: 143 mg/dL — ABNORMAL HIGH (ref 70–99)
Glucose-Capillary: 213 mg/dL — ABNORMAL HIGH (ref 70–99)
Glucose-Capillary: 170 mg/dL — ABNORMAL HIGH (ref 70–99)

## 2022-08-03 LAB — C-REACTIVE PROTEIN: CRP: 13.2 mg/dL — ABNORMAL HIGH (ref <1.0)

## 2022-08-03 LAB — COMPREHENSIVE METABOLIC PANEL
ALT: 32 U/L (ref 0–44)
AST: 42 U/L — ABNORMAL HIGH (ref 15–41)
Albumin: 2.5 g/dL — ABNORMAL LOW (ref 3.5–5.0)
Alkaline Phosphatase: 44 U/L (ref 38–126)
Anion gap: 6 (ref 5–15)
BUN: 27 mg/dL — ABNORMAL HIGH (ref 8–23)
CO2: 20 mmol/L — ABNORMAL LOW (ref 22–32)
Calcium: 7.6 mg/dL — ABNORMAL LOW (ref 8.9–10.3)
Chloride: 111 mmol/L (ref 98–111)
Creatinine, Ser: 0.96 mg/dL (ref 0.61–1.24)
GFR, Estimated: >60 mL/min (ref >60)
Glucose, Bld: 157 mg/dL — ABNORMAL HIGH (ref 70–99)
Potassium: 3.5 mmol/L (ref 3.5–5.1)
Sodium: 137 mmol/L (ref 135–145)
Total Bilirubin: 0.6 mg/dL (ref 0.3–1.2)
Total Protein: 5.4 g/dL — ABNORMAL LOW (ref 6.5–8.1)

## 2022-08-03 LAB — RESPIRATORY PANEL BY PCR

## 2022-08-03 LAB — CBC
HCT: 28.2 % — ABNORMAL LOW (ref 39.0–52.0)
Hemoglobin: 9.4 g/dL — ABNORMAL LOW (ref 13.0–17.0)
MCH: 29.8 pg (ref 26.0–34.0)
MCHC: 33.3 g/dL (ref 30.0–36.0)
MCV: 89.5 fL (ref 80.0–100.0)
Platelets: 158 10*3/uL (ref 150–400)
RBC: 3.15 MIL/uL — ABNORMAL LOW (ref 4.22–5.81)
RDW: 12.5 % (ref 11.5–15.5)
WBC: 7.2 10*3/uL (ref 4.0–10.5)
nRBC: 0 % (ref 0.0–0.2)

## 2022-08-03 LAB — MAGNESIUM: Magnesium: 1.7 mg/dL (ref 1.7–2.4)

## 2022-08-03 LAB — URINE CULTURE: Culture: >=100000 — AB

## 2022-08-03 LAB — HEPARIN LEVEL (UNFRACTIONATED): Heparin Unfractionated: 0.54 IU/mL (ref 0.30–0.70)

## 2022-08-03 LAB — CK: Total CK: 657 U/L — ABNORMAL HIGH (ref 49–397)

## 2022-08-03 LAB — RESP PANEL BY RT-PCR (RSV, FLU A&B, COVID)  RVPGX2
Influenza A by PCR: NEGATIVE
Influenza B by PCR: NEGATIVE
Resp Syncytial Virus by PCR: NEGATIVE
SARS Coronavirus 2 by RT PCR: POSITIVE — AB

## 2022-08-03 LAB — PHOSPHORUS: Phosphorus: 2.3 mg/dL — ABNORMAL LOW (ref 2.5–4.6)

## 2022-08-03 MED ORDER — SODIUM CHLORIDE 0.9 % IV SOLN
2.0000 g | Freq: Three times a day (TID) | INTRAVENOUS | Status: DC
  Administered 2022-08-03 – 2022-08-04 (×3): 2 g via INTRAVENOUS
  Filled 2022-08-03 (×4): qty 12.5

## 2022-08-03 MED ORDER — ACETAMINOPHEN 650 MG RE SUPP
650.0000 mg | Freq: Four times a day (QID) | RECTAL | Status: DC | PRN
  Administered 2022-08-05: 650 mg via RECTAL
  Filled 2022-08-03: qty 1

## 2022-08-03 MED ORDER — FUROSEMIDE 10 MG/ML IJ SOLN
20.0000 mg | Freq: Once | INTRAMUSCULAR | Status: AC
  Administered 2022-08-03: 20 mg via INTRAVENOUS
  Filled 2022-08-03: qty 2

## 2022-08-03 MED ORDER — ACETAMINOPHEN 500 MG PO TABS
1000.0000 mg | ORAL_TABLET | Freq: Four times a day (QID) | ORAL | Status: DC | PRN
  Administered 2022-08-04 – 2022-08-07 (×5): 1000 mg via ORAL
  Filled 2022-08-03 (×5): qty 2

## 2022-08-03 MED ORDER — ACETAMINOPHEN 325 MG PO TABS
325.0000 mg | ORAL_TABLET | Freq: Once | ORAL | Status: AC
  Administered 2022-08-03: 325 mg via ORAL
  Filled 2022-08-03: qty 1

## 2022-08-03 MED ORDER — LISINOPRIL 20 MG PO TABS
20.0000 mg | ORAL_TABLET | Freq: Every day | ORAL | Status: DC
  Administered 2022-08-03 – 2022-08-07 (×5): 20 mg via ORAL
  Filled 2022-08-03 (×5): qty 1

## 2022-08-03 MED ORDER — ISOSORBIDE DINITRATE 10 MG PO TABS
10.0000 mg | ORAL_TABLET | Freq: Three times a day (TID) | ORAL | Status: DC
  Administered 2022-08-03 – 2022-08-05 (×5): 10 mg via ORAL
  Filled 2022-08-03 (×6): qty 1

## 2022-08-03 MED ORDER — POTASSIUM PHOSPHATES 15 MMOLE/5ML IV SOLN
15.0000 mmol | Freq: Once | INTRAVENOUS | Status: AC
  Administered 2022-08-03: 15 mmol via INTRAVENOUS
  Filled 2022-08-03: qty 5

## 2022-08-03 NOTE — Consult Note (Signed)
ANTICOAGULATION CONSULT NOTE   Pharmacy Consult for Heparin Infusion Indication: chest pain/ACS and atrial fibrillation  Patient Measurements: Height: '5\' 7"'$  (170.2 cm) Weight: 74.7 kg (164 lb 10.9 oz) IBW/kg (Calculated) : 66.1 Heparin Dosing Weight: 79.7 kg  Labs: Recent Labs    07/09/2022 1747 07/28/2022 1747 08/01/2022 1924 08/01/22 0242 08/01/22 0545 08/01/22 0802 08/01/22 1248 08/02/22 0028 08/02/22 0356 08/02/22 0851 08/03/22 0433  HGB 13.0  --   --  11.7*  --   --   --   --  10.4*  --  9.4*  HCT 39.6  --   --  34.8*  --   --   --   --  30.8*  --  28.2*  PLT 221  --   --  188  --   --   --   --  172  --  158  LABPROT  --   --   --  15.2  --   --   --   --   --   --   --   INR  --   --   --  1.2  --   --   --   --   --   --   --   HEPARINUNFRC  --   --   --  0.41  --   --    < > 0.64  --  0.55 0.54  CREATININE 1.12  --   --   --   --   --   --   --  1.07  --  0.96  CKTOTAL  --    < > 4,431* 3,485*  --   --   --   --  1,735*  --  657*  CKMB  --   --   --  20.9*  --   --   --   --   --   --   --   TROPONINIHS 1,589*  --  2,131*  --  2,602* 2,867*  --   --   --   --   --    < > = values in this interval not displayed.     Estimated Creatinine Clearance: 61.2 mL/min (by C-G formula based on SCr of 0.96 mg/dL).   Medical History: Past Medical History:  Diagnosis Date   Diabetes mellitus without complication ([place])    History of kidney stones    Hyperlipidemia    Hypertension    Medications:  PTA: N/A Inpatient: Heparin infusion 1/24 >> Allergies: No AC/APT related allergies  Assessment: 77 year old male with PMH idabetes found on floor by EMS. Troponins increased from 1589 to 2131. EKG pending. Pharmacy consulted for heparin infusion in the setting of suspected ACS. Pt was in afib and will likely need DOAC at discharge.   Goal of Therapy:  Heparin level 0.3-0.7 units/ml Monitor platelets by anticoagulation protocol: Yes  1/25 0242 HL 0.41, therapeutic x  1 1/25 1330 HL 0.14, subtherapeutic 1/26 0028 HL 0.64, therapeutic x 1  1/26 0851 HL 0.55, therapeutic x 2 1/27 0433 HL 0.54, therapeutic x 3  Plan:  --Heparin level is therapeutic x 3 --Continue heparin infusion at 1150 units/hr --Re-check HL tomorrow AM --Daily CBC per protocol while on IV heparin  Renda Rolls, PharmD, [facility] 08/03/2022 5:47 AM

## 2022-08-03 NOTE — Progress Notes (Signed)
Rounding Note    Patient Name: Russell Byrd Date of Encounter: 08/03/2022  [place] Cardiologist: None  New consult done by Dr. Rockey Situ Subjective   Denies any chest pain or SOB.    Inpatient Medications    Scheduled Meds:  aspirin EC  81 mg Oral Daily   atorvastatin  20 mg Oral Daily   insulin aspart  0-5 Units Subcutaneous QHS   insulin aspart  0-9 Units Subcutaneous TID WC   metoprolol tartrate  25 mg Oral BID   pantoprazole  20 mg Oral Daily   sodium chloride flush  3 mL Intravenous Q12H   Continuous Infusions:  sodium chloride 100 mL/hr at 08/03/22 0726   sodium chloride     sodium chloride Stopped (08/02/22 0733)   ceFEPime (MAXIPIME) IV Stopped (08/03/22 0610)   heparin 1,150 Units/hr (08/03/22 0726)   potassium PHOSPHATE IVPB (in mmol)     PRN Meds: sodium chloride, acetaminophen **OR** acetaminophen, guaiFENesin, magnesium hydroxide, menthol-cetylpyridinium, ondansetron **OR** ondansetron (ZOFRAN) IV, sodium chloride flush, traZODone   Vital Signs    Vitals:   08/02/22 2000 08/02/22 2339 08/03/22 0402 08/03/22 0714  BP: (!) 140/57 (!) 156/66  (!) 159/73  Pulse:    77  Resp: '20 20  20  '$ Temp: (!) 100.6 F (38.1 C) 98.5 F (36.9 C) (!) 100.5 F (38.1 C) (!) 100.6 F (38.1 C)  TempSrc: Oral   Oral  SpO2:    96%  Weight:      Height:        Intake/Output Summary (Last 24 hours) at 08/03/2022 0805 Last data filed at 08/03/2022 2229 Gross per 24 hour  Intake 4507.35 ml  Output 1450 ml  Net 3057.35 ml       08/01/2022    9:15 PM 08/03/2022    8:00 PM 06/24/2022    3:51 PM  Last 3 Weights  Weight (lbs) 164 lb 10.9 oz 175 lb 11.3 oz 175 lb 9.6 oz  Weight (kg) 74.7 kg 79.7 kg 79.652 kg      Telemetry    NSR- Personally Reviewed  ECG    No new tracings completed at this time- Personally Reviewed  Physical Exam   GEN: Well nourished, well developed in no acute distress HEENT: Normal NECK: No JVD; No carotid  bruits LYMPHATICS: No lymphadenopathy CARDIAC:RRR, no murmurs, rubs, gallops RESPIRATORY:  Clear to auscultation without rales, wheezing or rhonchi  ABDOMEN: Soft, non-tender, non-distended MUSCULOSKELETAL:  No edema; No deformity  SKIN: Warm and dry NEUROLOGIC:  Alert and oriented x 3 PSYCHIATRIC:  Normal affect  Labs    High Sensitivity Troponin:   Recent Labs  Lab 07/23/2022 1747 07/24/2022 1924 08/01/22 0545 08/01/22 0802  TROPONINIHS 1,589* 2,131* 2,602* 2,867*      Chemistry Recent Labs  Lab 08/07/2022 1747 08/02/22 0356 08/03/22 0433  NA 138 137 137  K 3.6 3.5 3.5  CL 104 109 111  CO2 18* 21* 20*  GLUCOSE 203* 145* 157*  BUN 35* 29* 27*  CREATININE 1.12 1.07 0.96  CALCIUM 8.5* 7.6* 7.6*  MG 1.8 1.9 1.7  PROT 7.5 5.8* 5.4*  ALBUMIN 3.7 2.8* 2.5*  AST 114* 64* 42*  ALT 42 36 32  ALKPHOS 65 50 44  BILITOT 0.9 0.7 0.6  GFRNONAA >60 >60 >60  ANIONGAP 16* 7 6     Lipids No results for input(s): "CHOL", "TRIG", "HDL", "LABVLDL", "LDLCALC", "CHOLHDL" in the last 168 hours.  Hematology Recent Labs  Lab 08/01/22 [phone]  08/02/22 0356 08/03/22 0433  WBC 12.5* 9.6 7.2  RBC 3.95* 3.49* 3.15*  HGB 11.7* 10.4* 9.4*  HCT 34.8* 30.8* 28.2*  MCV 88.1 88.3 89.5  MCH 29.6 29.8 29.8  MCHC 33.6 33.8 33.3  RDW 12.5 12.6 12.5  PLT 188 172 158    Thyroid No results for input(s): "TSH", "FREET4" in the last 168 hours.  BNPNo results for input(s): "BNP", "PROBNP" in the last 168 hours.  DDimer No results for input(s): "DDIMER" in the last 168 hours.   Radiology    ECHOCARDIOGRAM COMPLETE  Result Date: 08/01/2022    ECHOCARDIOGRAM REPORT   Patient Name:   CHAN SHEAHAN Date of Exam: 08/01/2022 Medical Rec #:  [id number]        Height:       67.0 in Accession #:    [id number]       Weight:       175.7 lb Date of Birth:  [birth year]        BSA:          1.914 m Patient Age:    77 years         BP:           150/63 mmHg Patient Gender: M                HR:           76 bpm.  Exam Location:  [facility] Procedure: 2D Echo Indications:     elevated troponin  History:         Patient has no prior history of Echocardiogram examinations.                  Arrythmias:Atrial Fibrillation; Risk Factors:Hypertension,                  Diabetes and Dyslipidemia.  Sonographer:     Harvie Junior Referring Phys:  [id number] Solomon Diagnosing Phys: Ida Rogue MD IMPRESSIONS  1. Left ventricular ejection fraction, by estimation, is 55 to 60%. The left ventricle has normal function. The left ventricle has no regional wall motion abnormalities. There is mild left ventricular hypertrophy. Left ventricular diastolic parameters are indeterminate.  2. Right ventricular systolic function is normal. The right ventricular size is normal. There is normal pulmonary artery systolic pressure.  3. The mitral valve is normal in structure. Mild to moderate mitral valve regurgitation. No evidence of mitral stenosis.  4. The aortic valve is normal in structure. Aortic valve regurgitation is mild. No aortic stenosis is present.  5. The inferior vena cava is normal in size with greater than 50% respiratory variability, suggesting right atrial pressure of 3 mmHg. FINDINGS  Left Ventricle: Left ventricular ejection fraction, by estimation, is 55 to 60%. The left ventricle has normal function. The left ventricle has no regional wall motion abnormalities. The left ventricular internal cavity size was normal in size. There is  mild left ventricular hypertrophy. Left ventricular diastolic parameters are indeterminate. Right Ventricle: The right ventricular size is normal. No increase in right ventricular wall thickness. Right ventricular systolic function is normal. There is normal pulmonary artery systolic pressure. The tricuspid regurgitant velocity is 2.35 m/s, and  with an assumed right atrial pressure of 3 mmHg, the estimated right ventricular systolic pressure is 80.3 mmHg. Left Atrium: Left atrial size was normal in size.  Right Atrium: Right atrial size was normal in size. Pericardium: There is no evidence of pericardial effusion. Mitral Valve: The mitral valve is  normal in structure. Mild to moderate mitral valve regurgitation. No evidence of mitral valve stenosis. Tricuspid Valve: The tricuspid valve is normal in structure. Tricuspid valve regurgitation is mild . No evidence of tricuspid stenosis. Aortic Valve: The aortic valve is normal in structure. Aortic valve regurgitation is mild. Aortic regurgitation PHT measures 1346 msec. No aortic stenosis is present. Aortic valve mean gradient measures 3.5 mmHg. Aortic valve peak gradient measures 6.1 mmHg. Aortic valve area, by VTI measures 2.53 cm. Pulmonic Valve: The pulmonic valve was normal in structure. Pulmonic valve regurgitation is not visualized. No evidence of pulmonic stenosis. Aorta: The aortic root is normal in size and structure. Venous: The inferior vena cava is normal in size with greater than 50% respiratory variability, suggesting right atrial pressure of 3 mmHg. IAS/Shunts: No atrial level shunt detected by color flow Doppler.  LEFT VENTRICLE PLAX 2D LVIDd:         4.10 cm      Diastology LVIDs:         3.00 cm      LV e' medial:    5.91 cm/s LV PW:         1.30 cm      LV E/e' medial:  17.2 LV IVS:        1.20 cm      LV e' lateral:   11.25 cm/s LVOT diam:     2.00 cm      LV E/e' lateral: 9.1 LV SV:         55 LV SV Index:   29 LVOT Area:     3.14 cm  LV Volumes (MOD) LV vol d, MOD A2C: 63.2 ml LV vol d, MOD A4C: 119.0 ml LV vol s, MOD A2C: 30.2 ml LV vol s, MOD A4C: 59.1 ml LV SV MOD A2C:     33.0 ml LV SV MOD A4C:     119.0 ml LV SV MOD BP:      43.9 ml RIGHT VENTRICLE RV Basal diam:  3.60 cm RV Mid diam:    2.70 cm RV S prime:     17.17 cm/s TAPSE (M-mode): 2.1 cm LEFT ATRIUM             Index        RIGHT ATRIUM           Index LA diam:        4.00 cm 2.09 cm/m   RA Area:     15.50 cm LA Vol (A2C):   61.2 ml 31.98 ml/m  RA Volume:   34.10 ml  17.82 ml/m LA  Vol (A4C):   65.5 ml 34.22 ml/m LA Biplane Vol: 66.5 ml 34.74 ml/m  AORTIC VALVE                    PULMONIC VALVE AV Area (Vmax):    2.38 cm     PV Vmax:          1.17 m/s AV Area (Vmean):   2.33 cm     PV Peak grad:     5.5 mmHg AV Area (VTI):     2.53 cm     PR End Diast Vel: 2.61 msec AV Vmax:           123.00 cm/s AV Vmean:          83.000 cm/s AV VTI:            0.218 m AV Peak Grad:  6.1 mmHg AV Mean Grad:      3.5 mmHg LVOT Vmax:         93.30 cm/s LVOT Vmean:        61.500 cm/s LVOT VTI:          0.175 m LVOT/AV VTI ratio: 0.80 AI PHT:            1346 msec  AORTA Ao Root diam: 3.30 cm MITRAL VALVE                TRICUSPID VALVE MV Area (PHT): 3.85 cm     TR Peak grad:   22.1 mmHg MV Decel Time: 197 msec     TR Vmax:        235.00 cm/s MR Peak grad: 105.6 mmHg MR Mean grad: 74.0 mmHg     SHUNTS MR Vmax:      513.80 cm/s   Systemic VTI:  0.18 m MR Vmean:     408.0 cm/s    Systemic Diam: 2.00 cm MV E velocity: 102.00 cm/s MV A velocity: 37.70 cm/s MV E/A ratio:  2.71 Ida Rogue MD Electronically signed by Ida Rogue MD Signature Date/Time: 08/01/2022/4:15:13 PM    Final    DG Shoulder Left  Result Date: 08/01/2022 CLINICAL DATA:  Bilateral shoulder pain. EXAM: LEFT SHOULDER - 2+ VIEW COMPARISON:  None Available. FINDINGS: No fracture or dislocation. Mild osteoarthritis of the glenohumeral joint. Mild degenerative changes of the acromioclavicular joint. Soft tissues are normal. IMPRESSION: 1.  No acute osseous injury of the left shoulder. Electronically Signed   By: Kathreen Devoid M.D.   On: 08/01/2022 12:36   DG Shoulder Right  Result Date: 08/01/2022 CLINICAL DATA:  Is patient found on ground, bilateral shoulder pain EXAM: RIGHT SHOULDER - 2+ VIEW COMPARISON:  Chest radiograph 07/16/2022 FINDINGS: Moderate spurring and degenerative chondral thinning in the glenohumeral joint. Mild spurring of the [facility] joint. Subacromial morphology is type 2 (curved). No fracture, malalignment, or acute  bony findings. IMPRESSION: 1. Moderate osteoarthritis of the glenohumeral joint. Mild spurring of the [facility] joint. Electronically Signed   By: Van Clines M.D.   On: 08/01/2022 12:36    Cardiac Studies  TTE 08/01/22 1. Left ventricular ejection fraction, by estimation, is 55 to 60%. The  left ventricle has normal function. The left ventricle has no regional  wall motion abnormalities. There is mild left ventricular hypertrophy.  Left ventricular diastolic parameters  are indeterminate.   2. Right ventricular systolic function is normal. The right ventricular  size is normal. There is normal pulmonary artery systolic pressure.   3. The mitral valve is normal in structure. Mild to moderate mitral valve  regurgitation. No evidence of mitral stenosis.   4. The aortic valve is normal in structure. Aortic valve regurgitation is  mild. No aortic stenosis is present.   5. The inferior vena cava is normal in size with greater than 50%  respiratory variability, suggesting right atrial pressure of 3 mmHg.   Patient Profile     77 y.o. male with a history of gastroesophageal reflux disease, hypertension, hyperlipidemia, gait instability, type 2 diabetes, who is being seen and evaluated for atrial fibrillation with RVR and elevated high-sensitivity troponin  Assessment & Plan    NSTEMI -High-sensitivity troponins trended and peaked at 2867 -EKG was concerning for anterolateral ischemia with ST depression -No chest pain in the past 24 hours -Heart catheterization is being rescheduled until Monday, due to mild confusion, the requirement of mittens as he  had pulled out IVs and leads from his heart monitor, long discussion had with patient and with the daughter who is in agreement with postponing procedure until at the beginning of the week -Heart healthy carb modified diet and then n.p.o. at midnight Sunday -continue ASA '81mg'$  daily, Lopressor 25 mg twice daily and atorvastatin 20 mg daily.  Atrial  fibrillation RVR -Found down at home on the ground for 3 days, with UTI, presented with A-fib 150 bpm -Converted to sinus rhythm -Maintain normal sinus rhythm since admission -Continue on heparin drip until after heart catheterization then will likely transition to DOAC prior to discharge -CHA2DS2-VASc score of at least 4 and higher if he is found to have CAD -Continued on metoprolol tartrate 25 mg twice daily  Rhabdomyolysis -Was down for approximately 3 days -CK total trending down this morning 3485 -Continues to have muscle aches and pain to his bilateral shoulders and chest wall on mild palpation -Will likely need PT/OT  UTI -Continued on broad-spectrum antibiotics -Daily BMP to monitor renal function currently high risk for ATN  Gastroesophageal reflux disease -Continue home PPI  6.   Type 2 diabetes -Management per IM  For questions or updates, please contact [place] Please consult [email or web address] for contact info under    I have spent a total of 30 minutes with patient reviewing 2D echo , telemetry, EKGs, labs and examining patient as well as establishing an assessment and plan that was discussed with the patient.  > 50% of time was spent in direct patient care.      Signed, Fransico Him, MD  08/03/2022, 8:05 AM

## 2022-08-03 NOTE — Progress Notes (Signed)
PHARMACY NOTE:  ANTIMICROBIAL RENAL DOSAGE ADJUSTMENT  Current antimicrobial regimen includes a mismatch between antimicrobial dosage and estimated renal function.  As per policy approved by the Pharmacy & Therapeutics and Medical Executive Committees, the antimicrobial dosage will be adjusted accordingly.  Current antimicrobial dosage:  cefepime 1 gram IV every 12 hours  Indication: UTI  Renal Function:  Estimated Creatinine Clearance: 61.2 mL/min (by C-G formula based on SCr of 0.96 mg/dL).    Antimicrobial dosage has been changed to:  cefepime 2 grams IV every 8 hours    Thank you for allowing pharmacy to be a part of this patient's care.  Dallie Piles, [facility] 08/03/2022 8:14 AM

## 2022-08-03 NOTE — Progress Notes (Signed)
  Progress Note   Patient: Russell Byrd KGM:[id number] DOB: 1945/07/18 DOA: 07/16/2022     3 DOS: the patient was seen and examined on 08/03/2022   Brief hospital course:  Assessment and Plan:  * Sepsis due to gram-negative UTI ([place]) with enterobacter aerogenes - IV NS 100 cc/hr  - IV cefepime 2g q8 hr   Rhabdomyolysis - CK downtrending  - IV fluids as above    NSTEMI/Elevated troponin I level - IV heparin drip with anti-coagulation monitoring by pharmacy (continue heparin drip until cardiac cath on Mon)  - Cardiac cath on 07/08/2022 (NPO SUNDAY NIGHT) - ASA 81 mg PO daily  - Lipitor 20 mg PO daily - Lopressor 25 mg PO bid     Paroxysmal atrial fibrillation with RVR (HCC) - IV heparin drip  - Lopressor 25 mg PO bid    Dyslipidemia - Atorvastatin 20 mg PO daily    Type 2 diabetes mellitus without complications (HCC) - Novolog sliding scale tid and bedtime    Essential hypertension - Lopressor as above   COVID 19 infection - Monitor for now as he is on room air with O2 sats in the 90s    DVT prophylaxis: IV heparin drip as above  GI prophylaxis: Protonix 20 mg PO daily     Subjective: Pt seen and examined at the bedside. Mentation is more appropriate today. Daughter at the bedside. Cardiac cath planned for Mon Aug 05 2022.  Physical Exam: Vitals:   08/03/22 0402 08/03/22 0714 08/03/22 0726 08/03/22 0800  BP:  (!) 159/73  (!) 149/62  Pulse:  77    Resp:  '20 20 14  '$ Temp: (!) 100.5 F (38.1 C) (!) 100.6 F (38.1 C)    TempSrc:  Oral    SpO2:  96%    Weight:      Height:       HENT:     Head: Normocephalic.     Mouth/Throat:     Mouth: Mucous membranes are moist.  Cardiovascular:     Rate and Rhythm: Normal rate and regular rhythm.  Pulmonary:     Effort: Pulmonary effort is normal.  Abdominal:     General: Abdomen is flat.     Palpations: Abdomen is soft.  Musculoskeletal:        General: No swelling.     Cervical back: Neck supple.  Skin:     General: Skin is warm and dry.  Neurological:     Mental Status: He is alert. Mental status is at baseline.  Psychiatric:        Mood and Affect: Mood normal.   Data Reviewed:   Disposition: Status is: Inpatient  Planned Discharge Destination:  Re-evaluate after cardiac cath     Time spent: 35 minutes  Author: Lucienne Minks , MD 08/03/2022 9:30 AM  For on call review [email or web address].

## 2022-08-04 DIAGNOSIS — I214 Non-ST elevation (NSTEMI) myocardial infarction: Secondary | ICD-10-CM | POA: Diagnosis not present

## 2022-08-04 DIAGNOSIS — I10 Essential (primary) hypertension: Secondary | ICD-10-CM

## 2022-08-04 DIAGNOSIS — N390 Urinary tract infection, site not specified: Secondary | ICD-10-CM | POA: Diagnosis not present

## 2022-08-04 DIAGNOSIS — E876 Hypokalemia: Secondary | ICD-10-CM

## 2022-08-04 DIAGNOSIS — A4150 Gram-negative sepsis, unspecified: Secondary | ICD-10-CM | POA: Diagnosis not present

## 2022-08-04 DIAGNOSIS — I4891 Unspecified atrial fibrillation: Secondary | ICD-10-CM | POA: Diagnosis not present

## 2022-08-04 LAB — COMPREHENSIVE METABOLIC PANEL
ALT: 35 U/L (ref 0–44)
AST: 40 U/L (ref 15–41)
Albumin: 2.5 g/dL — ABNORMAL LOW (ref 3.5–5.0)
Alkaline Phosphatase: 49 U/L (ref 38–126)
Anion gap: 4 — ABNORMAL LOW (ref 5–15)
BUN: 24 mg/dL — ABNORMAL HIGH (ref 8–23)
CO2: 21 mmol/L — ABNORMAL LOW (ref 22–32)
Calcium: 7.6 mg/dL — ABNORMAL LOW (ref 8.9–10.3)
Chloride: 109 mmol/L (ref 98–111)
Creatinine, Ser: 1.23 mg/dL (ref 0.61–1.24)
GFR, Estimated: >60 mL/min (ref >60)
Glucose, Bld: 191 mg/dL — ABNORMAL HIGH (ref 70–99)
Potassium: 3.3 mmol/L — ABNORMAL LOW (ref 3.5–5.1)
Sodium: 134 mmol/L — ABNORMAL LOW (ref 135–145)
Total Bilirubin: 0.6 mg/dL (ref 0.3–1.2)
Total Protein: 5.9 g/dL — ABNORMAL LOW (ref 6.5–8.1)

## 2022-08-04 LAB — CBC
HCT: 27.7 % — ABNORMAL LOW (ref 39.0–52.0)
Hemoglobin: 9.5 g/dL — ABNORMAL LOW (ref 13.0–17.0)
MCH: 29.5 pg (ref 26.0–34.0)
MCHC: 34.3 g/dL (ref 30.0–36.0)
MCV: 86 fL (ref 80.0–100.0)
Platelets: 160 10*3/uL (ref 150–400)
RBC: 3.22 MIL/uL — ABNORMAL LOW (ref 4.22–5.81)
RDW: 11.9 % (ref 11.5–15.5)
WBC: 6 10*3/uL (ref 4.0–10.5)
nRBC: 0 % (ref 0.0–0.2)

## 2022-08-04 LAB — GLUCOSE, CAPILLARY
Glucose-Capillary: 187 mg/dL — ABNORMAL HIGH (ref 70–99)
Glucose-Capillary: 239 mg/dL — ABNORMAL HIGH (ref 70–99)
Glucose-Capillary: 234 mg/dL — ABNORMAL HIGH (ref 70–99)
Glucose-Capillary: 189 mg/dL — ABNORMAL HIGH (ref 70–99)

## 2022-08-04 LAB — MAGNESIUM: Magnesium: 1.5 mg/dL — ABNORMAL LOW (ref 1.7–2.4)

## 2022-08-04 LAB — PHOSPHORUS: Phosphorus: 2.1 mg/dL — ABNORMAL LOW (ref 2.5–4.6)

## 2022-08-04 LAB — CK: Total CK: 389 U/L (ref 49–397)

## 2022-08-04 LAB — HEPARIN LEVEL (UNFRACTIONATED): Heparin Unfractionated: 0.49 IU/mL (ref 0.30–0.70)

## 2022-08-04 LAB — C-REACTIVE PROTEIN: CRP: 11.8 mg/dL — ABNORMAL HIGH (ref <1.0)

## 2022-08-04 MED ORDER — SODIUM CHLORIDE 0.9 % IV SOLN
2.0000 g | Freq: Two times a day (BID) | INTRAVENOUS | Status: DC
  Administered 2022-08-04 – 2022-08-07 (×6): 2 g via INTRAVENOUS
  Filled 2022-08-04 (×2): qty 12.5
  Filled 2022-08-04: qty 2
  Filled 2022-08-04 (×2): qty 12.5
  Filled 2022-08-04 (×2): qty 2

## 2022-08-04 MED ORDER — POTASSIUM PHOSPHATES 15 MMOLE/5ML IV SOLN
15.0000 mmol | Freq: Once | INTRAVENOUS | Status: AC
  Administered 2022-08-04: 15 mmol via INTRAVENOUS
  Filled 2022-08-04: qty 5

## 2022-08-04 MED ORDER — MAGNESIUM SULFATE 4 GM/100ML IV SOLN
4.0000 g | Freq: Once | INTRAVENOUS | Status: AC
  Administered 2022-08-04: 4 g via INTRAVENOUS
  Filled 2022-08-04: qty 100

## 2022-08-04 MED ORDER — POTASSIUM CHLORIDE CRYS ER 20 MEQ PO TBCR
40.0000 meq | EXTENDED_RELEASE_TABLET | Freq: Once | ORAL | Status: AC
  Administered 2022-08-04: 40 meq via ORAL
  Filled 2022-08-04: qty 2

## 2022-08-04 NOTE — Progress Notes (Signed)
Progress Note  Due to the COVID-19 pandemic, this visit was completed with telemedicine (audio/video) technology to reduce patient and provider exposure as well as to preserve personal protective equipment.   Patient Name: Russell Byrd Date of Encounter: 08/04/2022  Primary Cardiologist: None Dr. Rockey Situ  Subjective   Denies any chest pain or SOB  Inpatient Medications    Scheduled Meds:  aspirin EC  81 mg Oral Daily   atorvastatin  20 mg Oral Daily   insulin aspart  0-5 Units Subcutaneous QHS   insulin aspart  0-9 Units Subcutaneous TID WC   isosorbide dinitrate  10 mg Oral TID   lisinopril  20 mg Oral Daily   metoprolol tartrate  25 mg Oral BID   pantoprazole  20 mg Oral Daily   sodium chloride flush  3 mL Intravenous Q12H   Continuous Infusions:  sodium chloride     sodium chloride Stopped (08/02/22 0733)   ceFEPime (MAXIPIME) IV     heparin 1,150 Units/hr (08/04/22 0831)   magnesium sulfate bolus IVPB 50 mL/hr at 08/04/22 0831   potassium PHOSPHATE IVPB (in mmol)     PRN Meds: sodium chloride, acetaminophen **OR** acetaminophen, guaiFENesin, magnesium hydroxide, menthol-cetylpyridinium, ondansetron **OR** ondansetron (ZOFRAN) IV, sodium chloride flush, traZODone   Vital Signs    Vitals:   08/04/22 0708 08/04/22 0752 08/04/22 0800 08/04/22 0831  BP:  (!) 143/63 (!) 122/58   Pulse:  62    Resp: (!) 23 (!) 26 (!) 24 16  Temp:  98.6 F (37 C)    TempSrc:  Oral    SpO2:  94%    Weight:      Height:        Intake/Output Summary (Last 24 hours) at 08/04/2022 1002 Last data filed at 08/04/2022 0831 Gross per 24 hour  Intake 989.5 ml  Output 2650 ml  Net -1660.5 ml      08/01/2022    9:15 PM 07/08/2022    8:00 PM 06/24/2022    3:51 PM  Last 3 Weights  Weight (lbs) 164 lb 10.9 oz 175 lb 11.3 oz 175 lb 9.6 oz  Weight (kg) 74.7 kg 79.7 kg 79.652 kg      Telemetry    NSR - Personally Reviewed  ECG    No new EKG to review - Personally  Reviewed  Physical Exam  NO exam done as was a telephone virtual visit due to [place]  Lab 08/02/22 0356 08/03/22 0433 08/04/22 0535  NA 137 137 134*  K 3.5 3.5 3.3*  CL 109 111 109  CO2 21* 20* 21*  GLUCOSE 145* 157* 191*  BUN 29* 27* 24*  CREATININE 1.07 0.96 1.23  CALCIUM 7.6* 7.6* 7.6*  PROT 5.8* 5.4* 5.9*  ALBUMIN 2.8* 2.5* 2.5*  AST 64* 42* 40  ALT 36 32 35  ALKPHOS 50 44 49  BILITOT 0.7 0.6 0.6  GFRNONAA >60 >60 >60  ANIONGAP 7 6 4*     Hematology Recent Labs  Lab 08/02/22 0356 08/03/22 0433 08/04/22 0535  WBC 9.6 7.2 6.0  RBC 3.49* 3.15* 3.22*  HGB 10.4* 9.4* 9.5*  HCT 30.8* 28.2* 27.7*  MCV 88.3 89.5 86.0  MCH 29.8 29.8 29.5  MCHC 33.8 33.3 34.3  RDW 12.6 12.5 11.9  PLT 172 158 160    Cardiac EnzymesNo results for input(s): "TROPONINI" in the last 168 hours. No results for input(s): "TROPIPOC" in the last 168 hours.  BNPNo results for input(s): "BNP", "PROBNP" in the last 168 hours.   DDimer No results for input(s): "DDIMER" in the last 168 hours.   Radiology    No results found.  Cardiac Studies   TTE 08/01/22 1. Left ventricular ejection fraction, by estimation, is 55 to 60%. The  left ventricle has normal function. The left ventricle has no regional  wall motion abnormalities. There is mild left ventricular hypertrophy.  Left ventricular diastolic parameters  are indeterminate.   2. Right ventricular systolic function is normal. The right ventricular  size is normal. There is normal pulmonary artery systolic pressure.   3. The mitral valve is normal in structure. Mild to moderate mitral valve  regurgitation. No evidence of mitral stenosis.   4. The aortic valve is normal in structure. Aortic valve regurgitation is  mild. No aortic stenosis is present.   5. The inferior vena cava is normal in size with greater than 50%  respiratory variability, suggesting right atrial pressure of 3 mmHg.   Patient  Profile     77 y.o. male with a history of gastroesophageal reflux disease, hypertension, hyperlipidemia, gait instability, type 2 diabetes, who is being seen and evaluated for atrial fibrillation with RVR and elevated high-sensitivity troponin    Assessment & Plan    NSTEMI -High-sensitivity troponins trended and peaked at 2867 -EKG was concerning for anterolateral ischemia with ST depression -2D echo though shows no regional wall motion normalities and normal LV function -No chest pain in the past the 48 hours -Heart catheterization was planned but rescheduled until tomorrow due to  mild confusion, the requirement of mittens as he had pulled out IVs and leads from his heart monitor, long discussion had with patient and with the daughter who is in agreement with postponing procedure until at the beginning of the week -Now found to be COVID-positive -continue aspirin 81 mg daily, Lopressor 25 mg twice daily, Isordil 10 mg 3 times daily and atorvastatin 20 mg daily and IV heparin drip -will keep n.p.o. after midnight for possible cath tomorrow pending further evaluation by cardiology in the a.m. given his now COVID-positive status  Atrial fibrillation RVR -Found down at home on the ground for 3 days, with UTI, presented with A-fib 150 bpm -Converted to sinus rhythm -Remains in normal sinus rhythm with no further breakthrough of A-fib -Continue on heparin drip until after heart catheterization transition to DOAC at discharge -CHA2DS2-VASc score of at least 4 and higher if he is found to have CAD -Continue Lopressor 25 mg twice daily   Rhabdomyolysis -Was down for approximately 3 days -CK total trending down this morning 3485 -Continues to have muscle aches and pain to his bilateral shoulders and chest wall on mild palpation -Will likely need PT/OT   UTI -Continued on broad-spectrum antibiotics -Daily BMP to monitor renal function currently high risk for ATN   Gastroesophageal reflux  disease -Continue home PPI   Type 2 diabetes -Management per IM  Hypokalemia/hypomagnesemia -Repleat potassium to keep >4 and magnesium >2 -BMET and Mag level in am  HTN -BP controlled -Continue lisinopril 20 mg daily and Lopressor 25 mg twice daily      For questions or updates, please contact [place] Please consult [email or web address] for contact info under        Signed, Fransico Him, MD  08/04/2022, 10:02 AM

## 2022-08-04 NOTE — Consult Note (Signed)
ANTICOAGULATION CONSULT NOTE   Pharmacy Consult for Heparin Infusion Indication: chest pain/ACS and atrial fibrillation  Patient Measurements: Height: '5\' 7"'$  (170.2 cm) Weight: 74.7 kg (164 lb 10.9 oz) IBW/kg (Calculated) : 66.1 Heparin Dosing Weight: 79.7 kg  Labs: Recent Labs    08/01/22 0802 08/01/22 1248 08/02/22 0356 08/02/22 0851 08/03/22 0433 08/04/22 0535  HGB  --    < > 10.4*  --  9.4* 9.5*  HCT  --   --  30.8*  --  28.2* 27.7*  PLT  --   --  172  --  158 160  HEPARINUNFRC  --    < >  --  0.55 0.54 0.49  CREATININE  --   --  1.07  --  0.96  --   CKTOTAL  --   --  5,053*  --  657*  --   TROPONINIHS 2,867*  --   --   --   --   --    < > = values in this interval not displayed.     Estimated Creatinine Clearance: 61.2 mL/min (by C-G formula based on SCr of 0.96 mg/dL).   Medical History: Past Medical History:  Diagnosis Date   Diabetes mellitus without complication ([place])    History of kidney stones    Hyperlipidemia    Hypertension    Medications:  PTA: N/A Inpatient: Heparin infusion 1/24 >> Allergies: No AC/APT related allergies  Assessment: 77 year old male with PMH idabetes found on floor by EMS. Troponins increased from 1589 to 2131. EKG pending. Pharmacy consulted for heparin infusion in the setting of suspected ACS. Pt was in afib and will likely need DOAC at discharge.   Goal of Therapy:  Heparin level 0.3-0.7 units/ml Monitor platelets by anticoagulation protocol: Yes  1/25 0242 HL 0.41, therapeutic x 1 1/25 1330 HL 0.14, subtherapeutic 1/26 0028 HL 0.64, therapeutic x 1  1/26 0851 HL 0.55, therapeutic x 2 1/27 0433 HL 0.54, therapeutic x 3 1/28 0535 HL 0.49, therapeutic x 4  Plan:  --Heparin level is therapeutic x 4 --Continue heparin infusion at 1150 units/hr --Re-check HL tomorrow AM --Daily CBC per protocol while on IV heparin  Renda Rolls, PharmD, [facility] 08/04/2022 6:15 AM

## 2022-08-04 NOTE — Progress Notes (Signed)
  Progress Note   Patient: Russell Byrd [id number]:[id number] DOB: 06/20/1946 DOA: 08/07/2022     4 DOS: the patient was seen and examined on 08/04/2022   Brief hospital course:  Assessment and Plan:  * Sepsis due to gram-negative UTI ([place]) with enterobacter aerogenes - IV cefepime 2g q12   Rhabdomyolysis - Resolved   NSTEMI/Elevated troponin I level - IV heparin drip with anti-coagulation monitoring by pharmacy (continue heparin drip until cardiac cath on Mon)  - Cardiac cath on 08/07/2022 (NPO SUNDAY NIGHT) - ASA 81 mg PO daily  - Lipitor 20 mg PO daily - Lopressor 25 mg PO bid     Paroxysmal atrial fibrillation with RVR (HCC) - IV heparin drip  - Lopressor 25 mg PO bid    Dyslipidemia - Atorvastatin 20 mg PO daily    Type 2 diabetes mellitus without complications (HCC) - Novolog sliding scale tid and bedtime    Essential hypertension - Lopressor as above  - Isordil 10 mg PO tid  - Lisinopril 20 mg PO daily    COVID 19 infection - Monitor for now as he is on room air with O2 sats in the 90s    DVT prophylaxis: IV heparin drip as above  GI prophylaxis: Protonix 20 mg PO daily      Subjective: Pt seen and examined at the bedside. Per cardiology continue NPO status for possible cardiac cath tmr (Mon 07/10/2022).  BP is improved with the addition of lisinopril and isordil.  Physical Exam: Vitals:   08/04/22 0831 08/04/22 0959 08/04/22 1000 08/04/22 1001  BP:   129/63   Pulse:      Resp: 16 15  (!) 22  Temp:      TempSrc:      SpO2:      Weight:      Height:       HENT:     Head: Normocephalic.     Mouth/Throat:     Mouth: Mucous membranes are moist.  Cardiovascular:     Rate and Rhythm: Normal rate and regular rhythm.  Pulmonary:     Effort: Pulmonary effort is normal.  Abdominal:     General: Abdomen is flat.     Palpations: Abdomen is soft.  Musculoskeletal:        General: No swelling.     Cervical back: Neck supple.  Skin:    General: Skin is warm  and dry.  Neurological:     Mental Status: He is alert. Mental status is at baseline.  Psychiatric:        Mood and Affect: Mood normal.   Data Reviewed:   Disposition: Status is: Inpatient  Planned Discharge Destination: Home with Home Health    Time spent: 35 minutes  Author: Lucienne Minks , MD 08/04/2022 10:41 AM  For on call review [email or web address].

## 2022-08-04 NOTE — Progress Notes (Signed)
PHARMACY NOTE:  ANTIMICROBIAL RENAL DOSAGE ADJUSTMENT  Current antimicrobial regimen includes a mismatch between antimicrobial dosage and estimated renal function.  As per policy approved by the Pharmacy & Therapeutics and Medical Executive Committees, the antimicrobial dosage will be adjusted accordingly.  Current antimicrobial dosage:  cefepime 1 gram IV every 8 hours  Indication: UTI  Renal Function:  Estimated Creatinine Clearance: 47.8 mL/min (by C-G formula based on SCr of 1.23 mg/dL).    Antimicrobial dosage has been changed to:  cefepime 2 grams IV every 12 hours   Thank you for allowing pharmacy to be a part of this patient's care.  Dallie Piles, [facility] 08/04/2022 9:25 AM

## 2022-08-05 ENCOUNTER — Encounter: Admission: EM | Disposition: E | Payer: Self-pay | Source: Home / Self Care | Attending: Internal Medicine

## 2022-08-05 DIAGNOSIS — I214 Non-ST elevation (NSTEMI) myocardial infarction: Secondary | ICD-10-CM | POA: Diagnosis not present

## 2022-08-05 DIAGNOSIS — N390 Urinary tract infection, site not specified: Secondary | ICD-10-CM | POA: Diagnosis not present

## 2022-08-05 DIAGNOSIS — A4150 Gram-negative sepsis, unspecified: Secondary | ICD-10-CM | POA: Diagnosis not present

## 2022-08-05 LAB — CBC
HCT: 27.6 % — ABNORMAL LOW (ref 39.0–52.0)
Hemoglobin: 9.4 g/dL — ABNORMAL LOW (ref 13.0–17.0)
MCH: 29.4 pg (ref 26.0–34.0)
MCHC: 34.1 g/dL (ref 30.0–36.0)
MCV: 86.3 fL (ref 80.0–100.0)
Platelets: 193 10*3/uL (ref 150–400)
RBC: 3.2 MIL/uL — ABNORMAL LOW (ref 4.22–5.81)
RDW: 12 % (ref 11.5–15.5)
WBC: 5.8 10*3/uL (ref 4.0–10.5)
nRBC: 0 % (ref 0.0–0.2)

## 2022-08-05 LAB — CULTURE, BLOOD (SINGLE): Culture: NO GROWTH

## 2022-08-05 LAB — COMPREHENSIVE METABOLIC PANEL
ALT: 52 U/L — ABNORMAL HIGH (ref 0–44)
AST: 64 U/L — ABNORMAL HIGH (ref 15–41)
Albumin: 2.5 g/dL — ABNORMAL LOW (ref 3.5–5.0)
Alkaline Phosphatase: 47 U/L (ref 38–126)
Anion gap: 8 (ref 5–15)
BUN: 25 mg/dL — ABNORMAL HIGH (ref 8–23)
CO2: 21 mmol/L — ABNORMAL LOW (ref 22–32)
Calcium: 7.9 mg/dL — ABNORMAL LOW (ref 8.9–10.3)
Chloride: 106 mmol/L (ref 98–111)
Creatinine, Ser: 1.11 mg/dL (ref 0.61–1.24)
GFR, Estimated: >60 mL/min (ref >60)
Glucose, Bld: 178 mg/dL — ABNORMAL HIGH (ref 70–99)
Potassium: 4.4 mmol/L (ref 3.5–5.1)
Sodium: 135 mmol/L (ref 135–145)
Total Bilirubin: 0.6 mg/dL (ref 0.3–1.2)
Total Protein: 5.9 g/dL — ABNORMAL LOW (ref 6.5–8.1)

## 2022-08-05 LAB — GLUCOSE, CAPILLARY
Glucose-Capillary: 169 mg/dL — ABNORMAL HIGH (ref 70–99)
Glucose-Capillary: 255 mg/dL — ABNORMAL HIGH (ref 70–99)
Glucose-Capillary: 154 mg/dL — ABNORMAL HIGH (ref 70–99)
Glucose-Capillary: 108 mg/dL — ABNORMAL HIGH (ref 70–99)

## 2022-08-05 LAB — C-REACTIVE PROTEIN: CRP: 11 mg/dL — ABNORMAL HIGH (ref <1.0)

## 2022-08-05 LAB — PHOSPHORUS: Phosphorus: 1.8 mg/dL — ABNORMAL LOW (ref 2.5–4.6)

## 2022-08-05 LAB — HEPARIN LEVEL (UNFRACTIONATED): Heparin Unfractionated: 0.53 IU/mL (ref 0.30–0.70)

## 2022-08-05 LAB — MAGNESIUM: Magnesium: 2 mg/dL (ref 1.7–2.4)

## 2022-08-05 SURGERY — LEFT HEART CATH AND CORONARY ANGIOGRAPHY
Anesthesia: Moderate Sedation

## 2022-08-05 MED ORDER — POTASSIUM PHOSPHATES 15 MMOLE/5ML IV SOLN
30.0000 mmol | Freq: Once | INTRAVENOUS | Status: AC
  Administered 2022-08-05: 30 mmol via INTRAVENOUS
  Filled 2022-08-05: qty 10

## 2022-08-05 MED ORDER — ISOSORBIDE DINITRATE 20 MG PO TABS
20.0000 mg | ORAL_TABLET | Freq: Three times a day (TID) | ORAL | Status: DC
  Administered 2022-08-05 – 2022-08-11 (×11): 20 mg via ORAL
  Filled 2022-08-05 (×18): qty 1

## 2022-08-05 NOTE — Plan of Care (Signed)
  Problem: Fluid Volume: Goal: Hemodynamic stability will improve Outcome: Progressing   Problem: Clinical Measurements: Goal: Diagnostic test results will improve Outcome: Progressing Goal: Signs and symptoms of infection will decrease Outcome: Progressing   Problem: Respiratory: Goal: Ability to maintain adequate ventilation will improve Outcome: Progressing   Problem: Education: Goal: Understanding of CV disease, CV risk reduction, and recovery process will improve Outcome: Progressing Goal: Individualized Educational Video(s) Outcome: Progressing   Problem: Activity: Goal: Ability to return to baseline activity level will improve Outcome: Progressing   Problem: Cardiovascular: Goal: Ability to achieve and maintain adequate cardiovascular perfusion will improve Outcome: Progressing Goal: Vascular access site(s) Level 0-1 will be maintained Outcome: Progressing   Problem: Health Behavior/Discharge Planning: Goal: Ability to safely manage health-related needs after discharge will improve Outcome: Progressing   Problem: Education: Goal: Ability to describe self-care measures that may prevent or decrease complications (Diabetes Survival Skills Education) will improve Outcome: Progressing Goal: Individualized Educational Video(s) Outcome: Progressing   Problem: Coping: Goal: Ability to adjust to condition or change in health will improve Outcome: Progressing   Problem: Fluid Volume: Goal: Ability to maintain a balanced intake and output will improve Outcome: Progressing   Problem: Health Behavior/Discharge Planning: Goal: Ability to identify and utilize available resources and services will improve Outcome: Progressing Goal: Ability to manage health-related needs will improve Outcome: Progressing   Problem: Metabolic: Goal: Ability to maintain appropriate glucose levels will improve Outcome: Progressing   Problem: Nutritional: Goal: Maintenance of adequate  nutrition will improve Outcome: Progressing Goal: Progress toward achieving an optimal weight will improve Outcome: Progressing   Problem: Skin Integrity: Goal: Risk for impaired skin integrity will decrease Outcome: Progressing   Problem: Tissue Perfusion: Goal: Adequacy of tissue perfusion will improve Outcome: Progressing   Problem: Education: Goal: Knowledge of General Education information will improve Description: Including pain rating scale, medication(s)/side effects and non-pharmacologic comfort measures Outcome: Progressing   Problem: Health Behavior/Discharge Planning: Goal: Ability to manage health-related needs will improve Outcome: Progressing   Problem: Clinical Measurements: Goal: Ability to maintain clinical measurements within normal limits will improve Outcome: Progressing Goal: Will remain free from infection Outcome: Progressing Goal: Diagnostic test results will improve Outcome: Progressing Goal: Respiratory complications will improve Outcome: Progressing Goal: Cardiovascular complication will be avoided Outcome: Progressing   Problem: Activity: Goal: Risk for activity intolerance will decrease Outcome: Progressing   Problem: Nutrition: Goal: Adequate nutrition will be maintained Outcome: Progressing   Problem: Coping: Goal: Level of anxiety will decrease Outcome: Progressing   Problem: Elimination: Goal: Will not experience complications related to bowel motility Outcome: Progressing Goal: Will not experience complications related to urinary retention Outcome: Progressing   Problem: Pain Managment: Goal: General experience of comfort will improve Outcome: Progressing   Problem: Safety: Goal: Ability to remain free from injury will improve Outcome: Progressing   Problem: Skin Integrity: Goal: Risk for impaired skin integrity will decrease Outcome: Progressing

## 2022-08-05 NOTE — Progress Notes (Signed)
   08/06/2022 2022  Assess: MEWS Score  Temp (!) 102.4 F (39.1 C)  BP (!) 153/91  MAP (mmHg) 107  Pulse Rate 96  Resp 17  Level of Consciousness Alert  SpO2 95 %  O2 Device Room Air  Assess: MEWS Score  MEWS Temp 2  MEWS Systolic 0  MEWS Pulse 0  MEWS RR 0  MEWS LOC 0  MEWS Score 2  MEWS Score Color Yellow  Assess: if the MEWS score is Yellow or Red  Were vital signs taken at a resting state? Yes  Focused Assessment No change from prior assessment  Does the patient meet 2 or more of the SIRS criteria? Yes  Does the patient have a confirmed or suspected source of infection? Yes  Provider and Rapid Response Notified? No  MEWS guidelines implemented *See Row Information* No, vital signs rechecked  Treat  MEWS Interventions Administered prn meds/treatments  Pain Scale 0-10  Pain Score 0  Assess: SIRS CRITERIA  SIRS Temperature  1  SIRS Pulse 1  SIRS Respirations  0  SIRS WBC 0  SIRS Score Sum  2

## 2022-08-05 NOTE — TOC Initial Note (Signed)
Transition of Care [facility]) - Initial/Assessment Note    Patient Details  Name: Russell Byrd MRN: [id number] Date of Birth: 09/18/1945  Transition of Care [facility]) CM/SW Contact:    Laurena Slimmer, RN Phone Number: 07/10/2022, 2:30 PM  Clinical Narrative:                  Transition of Care [facility]) Screening Note   Patient Details  Name: Russell Byrd Date of Birth: 26-Feb-1946   Transition of Care [facility]) CM/SW Contact:    Laurena Slimmer, RN Phone Number: 07/21/2022, 2:30 PM    Transition of Care Department [facility]) has reviewed patient and no TOC needs have been identified at this time. We will continue to monitor patient advancement through interdisciplinary progression rounds. If new patient transition needs arise, please place a TOC consult.          Patient Goals and CMS Choice            Expected Discharge Plan and Services                                              Prior Living Arrangements/Services                       Activities of Daily Living      Permission Sought/Granted                  Emotional Assessment              Admission diagnosis:  NSTEMI (non-ST elevated myocardial infarction) ([place]) [I21.4] Atrial fibrillation with rapid ventricular response ([place]) [I48.91] Sepsis secondary to UTI ([place]) [A41.9, N39.0] Traumatic rhabdomyolysis, initial encounter ([place]) [T79.6XXA] Sepsis due to gram-negative UTI ([place]) [A41.50, N39.0] Patient Active Problem List   Diagnosis Date Noted   Delirium 08/02/2022   Elevated troponin I level 08/01/2022   Essential hypertension 08/01/2022   Type 2 diabetes mellitus without complications ([place]) 35/00/9381   Dyslipidemia 08/01/2022   Rhabdomyolysis 08/01/2022   Sepsis secondary to UTI ([place]) 08/01/2022   Atrial fibrillation with rapid ventricular response ([place]) 08/01/2022   Paroxysmal atrial fibrillation with RVR ([place]) 08/01/2022   NSTEMI (non-ST elevated myocardial infarction)  ([place]) 08/01/2022   Sepsis due to gram-negative UTI ([place]) 08/07/2022   Atherosclerosis of aorta ([place]) 05/27/2022   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   Senile purpura ([place]) 11/19/2021   Strain of right biceps muscle 03/22/2021   Controlled substance agreement signed 11/13/2020   Advanced care planning/counseling discussion 10/13/2017   Hypertension associated with diabetes ([place]) 07/15/2017   Hyperlipidemia 07/15/2017   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 [id number]: [phone]: [phone]    Social Determinants of Health (SDOH) Social History: SDOH Screenings   Depression (PHQ2-9): Low Risk  (05/27/2022)  Tobacco Use: High Risk (06/24/2022)   SDOH Interventions:     Readmission Risk Interventions     No  data to display           

## 2022-08-05 NOTE — Inpatient Diabetes Management (Signed)
Inpatient Diabetes Program Recommendations  AACE/ADA: New Consensus Statement on Inpatient Glycemic Control   Target Ranges:  Prepandial:   less than 140 mg/dL      Peak postprandial:   less than 180 mg/dL (1-2 hours)      Critically ill patients:  140 - 180 mg/dL    Latest Reference Range & Units 07/20/2022 09:16 07/12/2022 11:37  Glucose-Capillary 70 - 99 mg/dL 169 (H) 255 (H)    Latest Reference Range & Units 08/04/22 07:51 08/04/22 11:32 08/04/22 15:58 08/04/22 23:24  Glucose-Capillary 70 - 99 mg/dL 187 (H) 239 (H) 234 (H) 189 (H)   Review of Glycemic Control  Current orders for Inpatient glycemic control: Novolog 0-9 units TID with meals, Novolog 0-5 units QHS  Inpatient Diabetes Program Recommendations:    Insulin: Please consider ordering Novolog 2 units TID with meals for meal coverage if patient eats at least 50% of meals.  Thanks, Barnie Alderman, RN, MSN, [place] Diabetes Coordinator Inpatient Diabetes Program [phone] (Team Pager from 8am to [place])

## 2022-08-05 NOTE — Progress Notes (Signed)
  Progress Note   Patient: Russell Byrd [id number]:[id number] DOB: October 28, 1945 DOA: 07/15/2022     5 DOS: the patient was seen and examined on 07/23/2022   Brief hospital course:  Assessment and Plan:  * Sepsis due to gram-negative UTI ([place]) with enterobacter aerogenes - IV cefepime 2g q12   Rhabdomyolysis - Resolved   NSTEMI/Elevated troponin I level - IV heparin drip with anti-coagulation monitoring by pharmacy (continue heparin drip until cardiac cath on Mon)  - Cardiac cath at cardiology discretion (once pt is afebrile for 24 hr) - ASA 81 mg PO daily  - Lipitor 20 mg PO daily - Lopressor 25 mg PO bid     Paroxysmal atrial fibrillation with RVR (HCC) - IV heparin drip  - Lopressor 25 mg PO bid    Dyslipidemia - Atorvastatin 20 mg PO daily    Type 2 diabetes mellitus without complications (HCC) - Novolog sliding scale tid and bedtime    Essential hypertension - Lopressor as above  - Isordil 20 mg PO tid  - Lisinopril 20 mg PO daily    COVID 19 infection - Monitor for now as he is on room air with O2 sats in the 90s    DVT prophylaxis: IV heparin drip as above  GI prophylaxis: Protonix 20 mg PO daily        Subjective: Pt seen and examined at the bedside. He was planned for cardiac cath today, however, he had a fever.   Cardiology would like the pt to be afebrile for 24 hrs prior to cardiac cath.   Physical Exam: Vitals:   07/28/2022 0307 07/08/2022 0428 07/27/2022 0913 08/04/2022 1136  BP: (!) 169/71  (!) 152/66 138/61  Pulse:   73 69  Resp: '20  20 20  '$ Temp: (!) 102.5 F (39.2 C) (!) 102.9 F (39.4 C) 98.7 F (37.1 C) 98.9 F (37.2 C)  TempSrc: Axillary Oral    SpO2: 96%  99% 96%  Weight:      Height:       HENT:     Head: Normocephalic.     Mouth/Throat:     Mouth: Mucous membranes are moist.  Cardiovascular:     Rate and Rhythm: Normal rate and regular rhythm.  Pulmonary:     Effort: Pulmonary effort is normal.  Abdominal:     General: Abdomen is flat.      Palpations: Abdomen is soft.  Musculoskeletal:        General: No swelling.     Cervical back: Neck supple.  Skin:    General: Skin is warm and dry.  Neurological:     Mental Status: He is alert. Mental status is at baseline.  Psychiatric:        Mood and Affect: Mood normal.   Data Reviewed:   Disposition: Status is: Inpatient  Planned Discharge Destination: Barriers to discharge: Cardiac cath    Time spent: 35 minutes  Author: Lucienne Minks , MD 07/23/2022 12:56 PM  For on call review [email or web address].

## 2022-08-05 NOTE — Progress Notes (Signed)
Progress Note   Patient Name: Russell Byrd Date of Encounter: 07/28/2022  Primary Cardiologist: None Dr. Rockey Situ  Subjective   Denies any chest pain or SOB. Fever 102.9 this morning.  Potassium repleted.   Inpatient Medications    Scheduled Meds:  aspirin EC  81 mg Oral Daily   atorvastatin  20 mg Oral Daily   insulin aspart  0-5 Units Subcutaneous QHS   insulin aspart  0-9 Units Subcutaneous TID WC   isosorbide dinitrate  10 mg Oral TID   lisinopril  20 mg Oral Daily   metoprolol tartrate  25 mg Oral BID   pantoprazole  20 mg Oral Daily   sodium chloride flush  3 mL Intravenous Q12H   Continuous Infusions:  sodium chloride     sodium chloride Stopped (08/02/22 0733)   ceFEPime (MAXIPIME) IV Stopped (08/04/22 2202)   heparin 1,150 Units/hr (07/16/2022 0700)   potassium PHOSPHATE IVPB (in mmol)     PRN Meds: sodium chloride, acetaminophen **OR** acetaminophen, guaiFENesin, magnesium hydroxide, menthol-cetylpyridinium, ondansetron **OR** ondansetron (ZOFRAN) IV, sodium chloride flush, traZODone   Vital Signs    Vitals:   08/04/22 1943 08/04/22 2325 07/21/2022 0307 07/16/2022 0428  BP: (!) 146/57 (!) 150/60 (!) 169/71   Pulse:      Resp: '18 20 20   '$ Temp: 99 F (37.2 C) 99.4 F (37.4 C) (!) 102.5 F (39.2 C) (!) 102.9 F (39.4 C)  TempSrc: Oral Oral Axillary Oral  SpO2: 97% 97% 96%   Weight:      Height:        Intake/Output Summary (Last 24 hours) at 07/27/2022 0753 Last data filed at 07/23/2022 0700 Gross per 24 hour  Intake 733.18 ml  Output 1250 ml  Net -516.82 ml       08/01/2022    9:15 PM 07/19/2022    8:00 PM 06/24/2022    3:51 PM  Last 3 Weights  Weight (lbs) 164 lb 10.9 oz 175 lb 11.3 oz 175 lb 9.6 oz  Weight (kg) 74.7 kg 79.7 kg 79.652 kg      Telemetry    SR with short atrial runs - Personally Reviewed  ECG    No new EKG to review - Personally Reviewed  Physical Exam   GEN: No acute distress.   Neck: No JVD. Cardiac: RRR, I/VI  systolic murmur LSB, no rubs, or gallops.  Respiratory: Diminished and coarse breath sounds bilaterally.  GI: Soft, nontender, non-distended.   MS: No edema; No deformity. Neuro:  Alert and oriented x 3; Nonfocal.  Psych: Normal affect. Labs    Chemistry Recent Labs  Lab 08/03/22 0433 08/04/22 0535 07/19/2022 0430  NA 137 134* 135  K 3.5 3.3* 4.4  CL 111 109 106  CO2 20* 21* 21*  GLUCOSE 157* 191* 178*  BUN 27* 24* 25*  CREATININE 0.96 1.23 1.11  CALCIUM 7.6* 7.6* 7.9*  PROT 5.4* 5.9* 5.9*  ALBUMIN 2.5* 2.5* 2.5*  AST 42* 40 64*  ALT 32 35 52*  ALKPHOS 44 49 47  BILITOT 0.6 0.6 0.6  GFRNONAA >60 >60 >60  ANIONGAP 6 4* 8      Hematology Recent Labs  Lab 08/03/22 0433 08/04/22 0535 07/21/2022 0430  WBC 7.2 6.0 5.8  RBC 3.15* 3.22* 3.20*  HGB 9.4* 9.5* 9.4*  HCT 28.2* 27.7* 27.6*  MCV 89.5 86.0 86.3  MCH 29.8 29.5 29.4  MCHC 33.3 34.3 34.1  RDW 12.5 11.9 12.0  PLT 158 160 193  Cardiac EnzymesNo results for input(s): "TROPONINI" in the last 168 hours. No results for input(s): "TROPIPOC" in the last 168 hours.   BNPNo results for input(s): "BNP", "PROBNP" in the last 168 hours.   DDimer No results for input(s): "DDIMER" in the last 168 hours.   Radiology    No results found.  Cardiac Studies   TTE 08/01/22 1. Left ventricular ejection fraction, by estimation, is 55 to 60%. The  left ventricle has normal function. The left ventricle has no regional  wall motion abnormalities. There is mild left ventricular hypertrophy.  Left ventricular diastolic parameters  are indeterminate.   2. Right ventricular systolic function is normal. The right ventricular  size is normal. There is normal pulmonary artery systolic pressure.   3. The mitral valve is normal in structure. Mild to moderate mitral valve  regurgitation. No evidence of mitral stenosis.   4. The aortic valve is normal in structure. Aortic valve regurgitation is  mild. No aortic stenosis is present.    5. The inferior vena cava is normal in size with greater than 50%  respiratory variability, suggesting right atrial pressure of 3 mmHg.   Patient Profile     77 y.o. male with a history of gastroesophageal reflux disease, hypertension, hyperlipidemia, gait instability, type 2 diabetes, who is being seen and evaluated for atrial fibrillation with RVR and NSTEMI.   Assessment & Plan    NSTEMI -High-sensitivity troponins trended and peaked at 2867 -EKG was concerning for anterolateral ischemia with ST depression -2D echo though shows no regional wall motion normalities and normal LV function -No chest pain -Heart catheterization was planned for last week, but postponed due to mild confusion, the requirement of mittens as he had pulled out IVs and leads from his heart monitor -At that time, there was a long discussion had with patient and with the daughter who was in agreement with postponing procedure until at the beginning of this week -Now found to be COVID-positive on 1/27 -Now with fever of 102.9 -Will need to wait until he is at least fever free, without antipyretics, for at least 24 hours prior to considering cardiac cath -Continue aspirin 81 mg daily, Lopressor 25 mg twice daily, Isordil 10 mg 3 times daily and atorvastatin 20 mg daily and IV heparin drip  Atrial fibrillation RVR -Found down at home on the ground for 3 days, with UTI, presented with A-fib 150 bpm -Converted to sinus rhythm -Remains in normal sinus rhythm  -Continue on heparin drip until after heart catheterization transition to DOAC at discharge -CHA2DS2-VASc score of at least 4 and higher if he is found to have CAD -Continue Lopressor 25 mg twice daily   Rhabdomyolysis -Was down for approximately 3 days -CK total trending down, now normal -Continues to have muscle aches and pain to his bilateral shoulders and chest wall on mild palpation -Will likely need PT/OT   UTI -Continued on broad-spectrum  antibiotics -Daily BMP to monitor renal function currently high risk for ATN   Gastroesophageal reflux disease -Continue home PPI   Type 2 diabetes -Management per IM  Hypokalemia/hypomagnesemia -Repleted   HTN -BP somewhat labile -Continue lisinopril 20 mg daily and Lopressor 25 mg twice daily        For questions or updates, please contact [place] Please consult [email or web address] for contact info under        Signed, Christell Faith, PA-C  08/04/2022, 7:53 AM

## 2022-08-05 NOTE — Progress Notes (Signed)
Called to get report on this patient for cardiac catheterization, nurse informed me he is now covid + and is febrile dr. Fletcher Anon made aware of this information, will delay for now until dr. Fletcher Anon assesses patient .

## 2022-08-05 NOTE — Consult Note (Signed)
ANTICOAGULATION CONSULT NOTE   Pharmacy Consult for Heparin Infusion Indication: chest pain/ACS and atrial fibrillation  Patient Measurements: Height: '5\' 7"'$  (170.2 cm) Weight: 74.7 kg (164 lb 10.9 oz) IBW/kg (Calculated) : 66.1 Heparin Dosing Weight: 79.7 kg  Labs: Recent Labs    08/03/22 0433 08/04/22 0535 07/08/2022 0430  HGB 9.4* 9.5* 9.4*  HCT 28.2* 27.7* 27.6*  PLT 158 160 193  HEPARINUNFRC 0.54 0.49 0.53  CREATININE 0.96 1.23 1.11  CKTOTAL 657* 389  --      Estimated Creatinine Clearance: 52.9 mL/min (by C-G formula based on SCr of 1.11 mg/dL).   Medical History: Past Medical History:  Diagnosis Date   Diabetes mellitus without complication ([place])    History of kidney stones    Hyperlipidemia    Hypertension    Medications:  PTA: N/A Inpatient: Heparin infusion 1/24 >> Allergies: No AC/APT related allergies  Assessment: 77 year old male with PMH idabetes found on floor by EMS. Troponins increased from 1589 to 2131. EKG pending. Pharmacy consulted for heparin infusion in the setting of suspected ACS. Pt was in afib and will likely need DOAC at discharge.   Goal of Therapy:  Heparin level 0.3-0.7 units/ml Monitor platelets by anticoagulation protocol: Yes  1/25 0242 HL 0.41, therapeutic x 1 1/25 1330 HL 0.14, subtherapeutic 1/26 0028 HL 0.64, therapeutic x 1  1/26 0851 HL 0.55, therapeutic x 2 1/27 0433 HL 0.54, therapeutic x 3 1/28 0535 HL 0.49, therapeutic x 4 1/29 0430 HL 0.53, therapeutic x 5  Plan:  --Heparin level is therapeutic x 5 --Continue heparin infusion at 1150 units/hr --Re-check HL tomorrow AM --Daily CBC per protocol while on IV heparin  Renda Rolls, PharmD, [facility] 07/25/2022 6:01 AM

## 2022-08-06 DIAGNOSIS — I214 Non-ST elevation (NSTEMI) myocardial infarction: Secondary | ICD-10-CM | POA: Diagnosis not present

## 2022-08-06 DIAGNOSIS — A4150 Gram-negative sepsis, unspecified: Secondary | ICD-10-CM | POA: Diagnosis not present

## 2022-08-06 DIAGNOSIS — N390 Urinary tract infection, site not specified: Secondary | ICD-10-CM | POA: Diagnosis not present

## 2022-08-06 DIAGNOSIS — I4891 Unspecified atrial fibrillation: Secondary | ICD-10-CM | POA: Diagnosis not present

## 2022-08-06 LAB — COMPREHENSIVE METABOLIC PANEL
ALT: 82 U/L — ABNORMAL HIGH (ref 0–44)
AST: 103 U/L — ABNORMAL HIGH (ref 15–41)
Albumin: 2.3 g/dL — ABNORMAL LOW (ref 3.5–5.0)
Alkaline Phosphatase: 50 U/L (ref 38–126)
Anion gap: 6 (ref 5–15)
BUN: 22 mg/dL (ref 8–23)
CO2: 23 mmol/L (ref 22–32)
Calcium: 7.5 mg/dL — ABNORMAL LOW (ref 8.9–10.3)
Chloride: 105 mmol/L (ref 98–111)
Creatinine, Ser: 1.18 mg/dL (ref 0.61–1.24)
GFR, Estimated: >60 mL/min (ref >60)
Glucose, Bld: 153 mg/dL — ABNORMAL HIGH (ref 70–99)
Potassium: 4.1 mmol/L (ref 3.5–5.1)
Sodium: 134 mmol/L — ABNORMAL LOW (ref 135–145)
Total Bilirubin: 0.6 mg/dL (ref 0.3–1.2)
Total Protein: 5.6 g/dL — ABNORMAL LOW (ref 6.5–8.1)

## 2022-08-06 LAB — CBC
HCT: 25.5 % — ABNORMAL LOW (ref 39.0–52.0)
Hemoglobin: 8.8 g/dL — ABNORMAL LOW (ref 13.0–17.0)
MCH: 29.5 pg (ref 26.0–34.0)
MCHC: 34.5 g/dL (ref 30.0–36.0)
MCV: 85.6 fL (ref 80.0–100.0)
Platelets: 183 10*3/uL (ref 150–400)
RBC: 2.98 MIL/uL — ABNORMAL LOW (ref 4.22–5.81)
RDW: 12.1 % (ref 11.5–15.5)
WBC: 6.6 10*3/uL (ref 4.0–10.5)
nRBC: 0 % (ref 0.0–0.2)

## 2022-08-06 LAB — PHOSPHORUS: Phosphorus: 3 mg/dL (ref 2.5–4.6)

## 2022-08-06 LAB — MAGNESIUM: Magnesium: 1.9 mg/dL (ref 1.7–2.4)

## 2022-08-06 LAB — GLUCOSE, CAPILLARY
Glucose-Capillary: 118 mg/dL — ABNORMAL HIGH (ref 70–99)
Glucose-Capillary: 138 mg/dL — ABNORMAL HIGH (ref 70–99)
Glucose-Capillary: 166 mg/dL — ABNORMAL HIGH (ref 70–99)
Glucose-Capillary: 116 mg/dL — ABNORMAL HIGH (ref 70–99)

## 2022-08-06 LAB — C-REACTIVE PROTEIN: CRP: 11.5 mg/dL — ABNORMAL HIGH (ref <1.0)

## 2022-08-06 LAB — HEPARIN LEVEL (UNFRACTIONATED): Heparin Unfractionated: 0.59 IU/mL (ref 0.30–0.70)

## 2022-08-06 NOTE — Consult Note (Signed)
ANTICOAGULATION CONSULT NOTE   Pharmacy Consult for Heparin Infusion Indication: chest pain/ACS and atrial fibrillation  Patient Measurements: Height: '5\' 7"'$  (170.2 cm) Weight: 74.7 kg (164 lb 10.9 oz) IBW/kg (Calculated) : 66.1 Heparin Dosing Weight: 79.7 kg  Labs: Recent Labs    08/04/22 0535 07/30/2022 0430 08/06/22 0355  HGB 9.5* 9.4* 8.8*  HCT 27.7* 27.6* 25.5*  PLT 160 193 183  HEPARINUNFRC 0.49 0.53 0.59  CREATININE 1.23 1.11 1.18  CKTOTAL 389  --   --      Estimated Creatinine Clearance: 49.8 mL/min (by C-G formula based on SCr of 1.18 mg/dL).   Medical History: Past Medical History:  Diagnosis Date   Diabetes mellitus without complication ([place])    History of kidney stones    Hyperlipidemia    Hypertension    Medications:  PTA: N/A Inpatient: Heparin infusion 1/24 >> Allergies: No AC/APT related allergies  Assessment: 77 year old male with PMH idabetes found on floor by EMS. Troponins increased from 1589 to 2131. EKG pending. Pharmacy consulted for heparin infusion in the setting of suspected ACS. Pt was in afib and will likely need DOAC at discharge.   Goal of Therapy:  Heparin level 0.3-0.7 units/ml Monitor platelets by anticoagulation protocol: Yes  1/25 0242 HL 0.41, therapeutic x 1 1/25 1330 HL 0.14, subtherapeutic 1/26 0028 HL 0.64, therapeutic x 1  1/26 0851 HL 0.55, therapeutic x 2 1/27 0433 HL 0.54, therapeutic x 3 1/28 0535 HL 0.49, therapeutic x 4 1/29 0430 HL 0.53, therapeutic x 5 1/30 0355 HL 0.59  Plan:  --Heparin level remains therapeutic  --Continue heparin infusion at 1150 units/hr --Re-check HL tomorrow AM --Daily CBC per protocol while on IV heparin  Dorothe Pea, PharmD, BCPS Clinical Pharmacist   08/06/2022 4:48 AM

## 2022-08-06 NOTE — Progress Notes (Signed)
  Progress Note   Patient: Russell Byrd UJW:[id number] DOB: 1946-03-01 DOA: 07/24/2022     6 DOS: the patient was seen and examined on 08/06/2022   Brief hospital course:  Assessment and Plan:  * Sepsis due to gram-negative UTI ([place]) with enterobacter aerogenes - IV cefepime 2g q12   Rhabdomyolysis - Resolved   NSTEMI/Elevated troponin I level - IV heparin drip with anti-coagulation monitoring by pharmacy - Cardiac cath at cardiology discretion (once pt is afebrile for 24 hr) - ASA 81 mg PO daily  - Lopressor 25 mg PO bid     Paroxysmal atrial fibrillation with RVR (HCC) - IV heparin drip  - Lopressor 25 mg PO bid    Dyslipidemia - Atorvastatin stopped due to rising LFTs (08/06/2022)   Type 2 diabetes mellitus without complications (HCC) - Novolog sliding scale tid and bedtime    Essential hypertension - Lopressor as above  - Isordil 20 mg PO tid  - Lisinopril 20 mg PO daily    COVID 19 infection - Monitor for now as he is on room air with O2 sats in the 90s    DVT prophylaxis: IV heparin drip as above  GI prophylaxis: Protonix 20 mg PO daily        Subjective: Pt seen and examined at the bedside. He had a fever 102.34F @ 07/30/2022 822 pm. Cardiology requested pt to be fever free for 24 hr prior to cardiac cath.   Physical Exam: Vitals:   08/04/2022 2342 08/06/22 0544 08/06/22 0835 08/06/22 1229  BP: 127/60 (!) 151/67 (!) 155/72 (!) 141/65  Pulse: 65 65 65 64  Resp: '19 17 18 18  '$ Temp: 98.9 F (37.2 C) 97.7 F (36.5 C) 98.3 F (36.8 C) 98.4 F (36.9 C)  TempSrc: Oral Oral Oral   SpO2: 97% 97% 97% 98%  Weight:      Height:       HENT:     Head: Normocephalic.     Mouth/Throat:     Mouth: Mucous membranes are moist.  Cardiovascular:     Rate and Rhythm: Normal rate and regular rhythm.  Pulmonary:     Effort: Pulmonary effort is normal.  Abdominal:     General: Abdomen is flat.     Palpations: Abdomen is soft.  Musculoskeletal:        General: No  swelling.     Cervical back: Neck supple.  Skin:    General: Skin is warm and dry.  Neurological:     Mental Status: He is alert. Mental status is at baseline.  Psychiatric:        Mood and Affect: Mood normal.   Data Reviewed:   Disposition: Status is: Inpatient  Planned Discharge Destination: Barriers to discharge: Cardiac cath at cardiology discretion     Time spent: 35 minutes  Author: Lucienne Minks , MD 08/06/2022 1:31 PM  For on call review [email or web address].

## 2022-08-06 NOTE — Progress Notes (Signed)
Progress Note   Patient Name: CHAMP KEETCH Date of Encounter: 08/06/2022  Primary Cardiologist: None Dr. Rockey Situ  Subjective   Denies any chest pain, SOB, or palpitations. Fever 102.4 last evening.  Hgb 8.8, down from 13 six days ago.   Inpatient Medications    Scheduled Meds:  aspirin EC  81 mg Oral Daily   atorvastatin  20 mg Oral Daily   insulin aspart  0-5 Units Subcutaneous QHS   insulin aspart  0-9 Units Subcutaneous TID WC   isosorbide dinitrate  20 mg Oral TID   lisinopril  20 mg Oral Daily   metoprolol tartrate  25 mg Oral BID   pantoprazole  20 mg Oral Daily   sodium chloride flush  3 mL Intravenous Q12H   Continuous Infusions:  sodium chloride     sodium chloride Stopped (08/02/22 0733)   ceFEPime (MAXIPIME) IV 2 g (07/15/2022 2129)   heparin 1,150 Units/hr (08/06/22 0228)   PRN Meds: sodium chloride, acetaminophen **OR** acetaminophen, guaiFENesin, magnesium hydroxide, menthol-cetylpyridinium, ondansetron **OR** ondansetron (ZOFRAN) IV, sodium chloride flush, traZODone   Vital Signs    Vitals:   07/16/2022 2022 07/14/2022 2121 07/27/2022 2342 08/06/22 0544  BP: (!) 153/91 (!) 135/52 127/60 (!) 151/67  Pulse: 96 85 65 65  Resp: '17 19 19 17  '$ Temp: (!) 102.4 F (39.1 C) 99.4 F (37.4 C) 98.9 F (37.2 C) 97.7 F (36.5 C)  TempSrc: Oral Oral Oral Oral  SpO2: 95% 96% 97% 97%  Weight:      Height:        Intake/Output Summary (Last 24 hours) at 08/06/2022 0730 Last data filed at 08/06/2022 0544 Gross per 24 hour  Intake 1696.41 ml  Output 1950 ml  Net -253.59 ml       08/01/2022    9:15 PM 07/23/2022    8:00 PM 06/24/2022    3:51 PM  Last 3 Weights  Weight (lbs) 164 lb 10.9 oz 175 lb 11.3 oz 175 lb 9.6 oz  Weight (kg) 74.7 kg 79.7 kg 79.652 kg      Telemetry    SR with rare PAC - Personally Reviewed  ECG    No new EKG to review - Personally Reviewed  Physical Exam   GEN: No acute distress.   Neck: No JVD. Cardiac: RRR, I/VI systolic  murmur LSB, no rubs, or gallops.  Respiratory: Diminished and coarse breath sounds bilaterally, with mild expiratory wheezing.  GI: Soft, nontender, non-distended.   MS: No edema; No deformity. Neuro:  Alert and oriented x 3; Nonfocal.  Psych: Normal affect. Labs    Chemistry Recent Labs  Lab 08/04/22 0535 08/03/2022 0430 08/06/22 0355  NA 134* 135 134*  K 3.3* 4.4 4.1  CL 109 106 105  CO2 21* 21* 23  GLUCOSE 191* 178* 153*  BUN 24* 25* 22  CREATININE 1.23 1.11 1.18  CALCIUM 7.6* 7.9* 7.5*  PROT 5.9* 5.9* 5.6*  ALBUMIN 2.5* 2.5* 2.3*  AST 40 64* 103*  ALT 35 52* 82*  ALKPHOS 49 47 50  BILITOT 0.6 0.6 0.6  GFRNONAA >60 >60 >60  ANIONGAP 4* 8 6      Hematology Recent Labs  Lab 08/04/22 0535 07/28/2022 0430 08/06/22 0355  WBC 6.0 5.8 6.6  RBC 3.22* 3.20* 2.98*  HGB 9.5* 9.4* 8.8*  HCT 27.7* 27.6* 25.5*  MCV 86.0 86.3 85.6  MCH 29.5 29.4 29.5  MCHC 34.3 34.1 34.5  RDW 11.9 12.0 12.1  PLT 160 193 183  Cardiac EnzymesNo results for input(s): "TROPONINI" in the last 168 hours. No results for input(s): "TROPIPOC" in the last 168 hours.   BNPNo results for input(s): "BNP", "PROBNP" in the last 168 hours.   DDimer No results for input(s): "DDIMER" in the last 168 hours.   Radiology    No results found.  Cardiac Studies   TTE 08/01/22 1. Left ventricular ejection fraction, by estimation, is 55 to 60%. The  left ventricle has normal function. The left ventricle has no regional  wall motion abnormalities. There is mild left ventricular hypertrophy.  Left ventricular diastolic parameters  are indeterminate.   2. Right ventricular systolic function is normal. The right ventricular  size is normal. There is normal pulmonary artery systolic pressure.   3. The mitral valve is normal in structure. Mild to moderate mitral valve  regurgitation. No evidence of mitral stenosis.   4. The aortic valve is normal in structure. Aortic valve regurgitation is  mild. No  aortic stenosis is present.   5. The inferior vena cava is normal in size with greater than 50%  respiratory variability, suggesting right atrial pressure of 3 mmHg.   Patient Profile     77 y.o. male with a history of gastroesophageal reflux disease, hypertension, hyperlipidemia, gait instability, type 2 diabetes, who is being seen and evaluated for atrial fibrillation with RVR and NSTEMI.   Assessment & Plan    NSTEMI -High-sensitivity troponins trended and peaked at 2867 -EKG was concerning for anterolateral ischemia with ST depression -2D echo though shows no regional wall motion normalities and normal LV function -No chest pain -Cardiac catheterization was planned for last week, but postponed due to mild confusion, the requirement of mittens as he had pulled out IVs and leads from his heart monitor -At that time, there was a long discussion had with patient and with the daughter who was in agreement with postponing procedure until at the beginning of this week -Now found to be COVID-positive on 1/27 -Now with fever of 102.4 during the evening of 1/29 -Will need to wait until he is at least fever free, without antipyretics, for at least 24 hours prior to considering cardiac cath -Continue aspirin 81 mg daily, Lopressor 25 mg twice daily, Isordil 10 mg 3 times daily and atorvastatin 20 mg daily and IV heparin drip  Atrial fibrillation RVR -Found down at home on the ground for 3 days, with UTI, presented with A-fib 150 bpm -Converted to sinus rhythm -Remains in normal sinus rhythm  -Continue on heparin drip until after heart catheterization transition to DOAC at discharge -CHA2DS2-VASc score of at least 4 and higher if he is found to have CAD -Continue Lopressor 25 mg twice daily   Rhabdomyolysis -Was down for approximately 3 days -CK total trending down, now normal -Will likely need PT/OT   UTI -Continued on broad-spectrum antibiotics -Daily BMP to monitor renal function  currently high risk for ATN   Gastroesophageal reflux disease -Continue home PPI   Type 2 diabetes -Management per IM  Hypokalemia/hypomagnesemia -Repleted   HTN -BP somewhat labile -Continue lisinopril 20 mg daily and Lopressor 25 mg twice daily  Anemia: -Hgb trend to 8.8 today, down from 13 six days prior -Remains on heparin gtt -No obvious bleed -Will need further evaluation prior to pursuing cardiac cath -Management per IM      For questions or updates, please contact [place] Please consult [email or web address] for contact info under        Signed, [employer]  Noemi Bellissimo, PA-C  08/06/2022, 7:30 AM

## 2022-08-06 NOTE — Plan of Care (Signed)
  Problem: Fluid Volume: Goal: Hemodynamic stability will improve Outcome: Progressing   Problem: Clinical Measurements: Goal: Diagnostic test results will improve Outcome: Progressing Goal: Signs and symptoms of infection will decrease Outcome: Progressing   Problem: Respiratory: Goal: Ability to maintain adequate ventilation will improve Outcome: Progressing   Problem: Education: Goal: Understanding of CV disease, CV risk reduction, and recovery process will improve Outcome: Progressing Goal: Individualized Educational Video(s) Outcome: Progressing   Problem: Activity: Goal: Ability to return to baseline activity level will improve Outcome: Progressing   Problem: Cardiovascular: Goal: Ability to achieve and maintain adequate cardiovascular perfusion will improve Outcome: Progressing Goal: Vascular access site(s) Level 0-1 will be maintained Outcome: Progressing   Problem: Health Behavior/Discharge Planning: Goal: Ability to safely manage health-related needs after discharge will improve Outcome: Progressing   Problem: Education: Goal: Ability to describe self-care measures that may prevent or decrease complications (Diabetes Survival Skills Education) will improve Outcome: Progressing Goal: Individualized Educational Video(s) Outcome: Progressing   Problem: Coping: Goal: Ability to adjust to condition or change in health will improve Outcome: Progressing   Problem: Fluid Volume: Goal: Ability to maintain a balanced intake and output will improve Outcome: Progressing   Problem: Health Behavior/Discharge Planning: Goal: Ability to identify and utilize available resources and services will improve Outcome: Progressing Goal: Ability to manage health-related needs will improve Outcome: Progressing   Problem: Metabolic: Goal: Ability to maintain appropriate glucose levels will improve Outcome: Progressing   Problem: Nutritional: Goal: Maintenance of adequate  nutrition will improve Outcome: Progressing Goal: Progress toward achieving an optimal weight will improve Outcome: Progressing   Problem: Skin Integrity: Goal: Risk for impaired skin integrity will decrease Outcome: Progressing   Problem: Tissue Perfusion: Goal: Adequacy of tissue perfusion will improve Outcome: Progressing   Problem: Education: Goal: Knowledge of General Education information will improve Description: Including pain rating scale, medication(s)/side effects and non-pharmacologic comfort measures Outcome: Progressing   Problem: Health Behavior/Discharge Planning: Goal: Ability to manage health-related needs will improve Outcome: Progressing   Problem: Clinical Measurements: Goal: Ability to maintain clinical measurements within normal limits will improve Outcome: Progressing Goal: Will remain free from infection Outcome: Progressing Goal: Diagnostic test results will improve Outcome: Progressing Goal: Respiratory complications will improve Outcome: Progressing Goal: Cardiovascular complication will be avoided Outcome: Progressing   Problem: Activity: Goal: Risk for activity intolerance will decrease Outcome: Progressing   Problem: Nutrition: Goal: Adequate nutrition will be maintained Outcome: Progressing   Problem: Coping: Goal: Level of anxiety will decrease Outcome: Progressing   Problem: Elimination: Goal: Will not experience complications related to bowel motility Outcome: Progressing Goal: Will not experience complications related to urinary retention Outcome: Progressing   Problem: Pain Managment: Goal: General experience of comfort will improve Outcome: Progressing   Problem: Safety: Goal: Ability to remain free from injury will improve Outcome: Progressing   Problem: Skin Integrity: Goal: Risk for impaired skin integrity will decrease Outcome: Progressing

## 2022-08-07 DIAGNOSIS — N390 Urinary tract infection, site not specified: Secondary | ICD-10-CM | POA: Diagnosis not present

## 2022-08-07 DIAGNOSIS — T796XXD Traumatic ischemia of muscle, subsequent encounter: Secondary | ICD-10-CM | POA: Diagnosis not present

## 2022-08-07 DIAGNOSIS — A4150 Gram-negative sepsis, unspecified: Secondary | ICD-10-CM

## 2022-08-07 DIAGNOSIS — I4891 Unspecified atrial fibrillation: Secondary | ICD-10-CM | POA: Diagnosis not present

## 2022-08-07 DIAGNOSIS — I214 Non-ST elevation (NSTEMI) myocardial infarction: Secondary | ICD-10-CM | POA: Diagnosis not present

## 2022-08-07 LAB — BASIC METABOLIC PANEL
Anion gap: 4 — ABNORMAL LOW (ref 5–15)
BUN: 25 mg/dL — ABNORMAL HIGH (ref 8–23)
CO2: 27 mmol/L (ref 22–32)
Calcium: 7.8 mg/dL — ABNORMAL LOW (ref 8.9–10.3)
Chloride: 103 mmol/L (ref 98–111)
Creatinine, Ser: 1.23 mg/dL (ref 0.61–1.24)
GFR, Estimated: >60 mL/min (ref >60)
Glucose, Bld: 149 mg/dL — ABNORMAL HIGH (ref 70–99)
Potassium: 4.3 mmol/L (ref 3.5–5.1)
Sodium: 134 mmol/L — ABNORMAL LOW (ref 135–145)

## 2022-08-07 LAB — CBC
HCT: 24.5 % — ABNORMAL LOW (ref 39.0–52.0)
Hemoglobin: 8.3 g/dL — ABNORMAL LOW (ref 13.0–17.0)
MCH: 29.3 pg (ref 26.0–34.0)
MCHC: 33.9 g/dL (ref 30.0–36.0)
MCV: 86.6 fL (ref 80.0–100.0)
Platelets: 209 10*3/uL (ref 150–400)
RBC: 2.83 MIL/uL — ABNORMAL LOW (ref 4.22–5.81)
RDW: 12 % (ref 11.5–15.5)
WBC: 8.5 10*3/uL (ref 4.0–10.5)
nRBC: 0 % (ref 0.0–0.2)

## 2022-08-07 LAB — GLUCOSE, CAPILLARY
Glucose-Capillary: 132 mg/dL — ABNORMAL HIGH (ref 70–99)
Glucose-Capillary: 161 mg/dL — ABNORMAL HIGH (ref 70–99)
Glucose-Capillary: 207 mg/dL — ABNORMAL HIGH (ref 70–99)
Glucose-Capillary: 178 mg/dL — ABNORMAL HIGH (ref 70–99)

## 2022-08-07 LAB — HEPARIN LEVEL (UNFRACTIONATED): Heparin Unfractionated: 0.45 IU/mL (ref 0.30–0.70)

## 2022-08-07 MED ORDER — LISINOPRIL 5 MG PO TABS
40.0000 mg | ORAL_TABLET | Freq: Every day | ORAL | Status: DC
  Filled 2022-08-07: qty 2

## 2022-08-07 NOTE — Progress Notes (Signed)
Progress Note   Patient: Russell Byrd [id number]:[id number] DOB: 1945-08-10 DOA: 07/28/2022     7 DOS: the patient was seen and examined on 08/07/2022   Brief hospital course: 77 year old male with past medical history of paroxysmal atrial fibrillation, diabetes mellitus who is into the emergency room on 1/24 with confusion and fall and had been on the floor for 3 days prior to being found.  In the emergency room, patient found to have sepsis secondary to Enterobacter UTI as well as elevated.  Admitted to the hospitalist service and started on IV cefepime.  Troponins continue to trend upward and patient felt to have a non-STEMI.  Initial plan was for cardiac catheterization, but was delayed due to patient having some acute hospital delirium, likely in the setting of underlying dementia.  1/27, patient testing positive for COVID. Assessment and Plan:  * Sepsis due to gram-negative UTI ([place]) with enterobacter aerogenes-solved Treated with IV fluids and completed 5-day cefepime course   Rhabdomyolysis-resolved Treated with IV fluids   NSTEMI/Elevated troponin I level - IV heparin drip with anti-coagulation monitoring by pharmacy Catheterization planned.  Initially held due to patient's acute delirium which is much improved.  Then noted to have fever and workup revealed COVID.  Open have cardiac catheterization done later this week.   Paroxysmal atrial fibrillation with RVR (HCC) - IV heparin drip  - Lopressor 25 mg PO bid    Dyslipidemia - Atorvastatin stopped due to rising LFTs (08/06/2022)   Type 2 diabetes mellitus without complications ([facility]) - Novolog sliding scale tid and bedtime    Essential hypertension - Lopressor as above  - Isordil 20 mg PO tid  - Lisinopril 20 mg PO daily   Anemia Hemoglobin has been slowly trending downward since hospitalization.  Monitoring closely.  He is on heparin.  No evidence of acute bleeding, blood pressure stable   COVID 19 infection Stable, not  requiring medication at this time.  Not hypoxic  Senile dementia with acute behavioral disturbance Likely secondary to COVID and hospital delirium.  Improved  Overweight: Meets criteria BMI greater than 25   DVT prophylaxis: IV heparin drip as above  GI prophylaxis: Protonix 20 mg PO daily   Antibiotics: Complete 5-day course of cefepime Consultants: Cardiology Procedures: -Echocardiogram -Cardiac cath pending     Subjective: Pt seen and examined at the bedside. He had a fever 102.61F @ 07/12/2022 822 pm. Cardiology requested pt to be fever free for 24 hr prior to cardiac cath.   Physical Exam: Vitals:   08/07/22 0734 08/07/22 1100 08/07/22 1203 08/07/22 1647  BP: (!) 154/67  (!) 144/63 (!) 134/57  Pulse: 69  (!) 57 (!) 54  Resp: '20 20 14 18  '$ Temp: 98.9 F (37.2 C)  98.3 F (36.8 C) 98 F (36.7 C)  TempSrc: Oral  Oral Oral  SpO2: 98%  99% 99%  Weight:      Height:       HEENT: Normocephalic, atraumatic, mucous membranes are moist Cardiovascular:  Regular rate and rhythm, S1-S2 Pulmonary:  Clear to auscultation bilaterally Abdominal:  Soft, nontender, nondistended, positive bowel sounds Musculoskeletal:     No clubbing or cyanosis or edema Skin: No skin breaks, tears or lesions Neurological:  No focal deficits Psychiatric:     No evidence of acute psychoses, confused but appropriate  Data Reviewed: Creatinine of 1.23.  Hemoglobin of 8.3.  Disposition: Status is: Inpatient  Planned Discharge Destination: Barriers to discharge: Cardiac cath at cardiology discretion  Time spent: 35 minutes  Author: Annita Brod, MD 08/07/2022 5:34 PM  For on call review [email or web address].

## 2022-08-07 NOTE — TOC Progression Note (Addendum)
Transition of Care [facility]) - Progression Note    Patient Details  Name: Russell Byrd MRN: [id number] Date of Birth: 1946-05-13  [facility]) CM/SW Contact  Laurena Slimmer, RN Phone Number: 08/07/2022, 3:47 PM  Clinical Narrative:    Spoke with patient's Son, Annie Main regarding discharge plan for SNF. He is agreeable to SNF. He does not have a choice but would like a local search for [facility] facilities.   FL2 completed.  Unable to access [place] to obtain PASSR        Expected Discharge Plan and Services                                               Social Determinants of Health (SDOH) Interventions SDOH Screenings   Depression (PHQ2-9): Low Risk  (05/27/2022)  Tobacco Use: High Risk (06/24/2022)    Readmission Risk Interventions     No data to display

## 2022-08-07 NOTE — Consult Note (Signed)
ANTICOAGULATION CONSULT NOTE   Pharmacy Consult for Heparin Infusion Indication: chest pain/ACS and atrial fibrillation  Patient Measurements: Height: '5\' 7"'$  (170.2 cm) Weight: 74.7 kg (164 lb 10.9 oz) IBW/kg (Calculated) : 66.1 Heparin Dosing Weight: 79.7 kg  Labs: Recent Labs    08/04/22 0535 08/04/2022 0430 08/06/22 0355 08/07/22 0146  HGB 9.5* 9.4* 8.8* 8.3*  HCT 27.7* 27.6* 25.5* 24.5*  PLT 160 193 183 209  HEPARINUNFRC 0.49 0.53 0.59 0.45  CREATININE 1.23 1.11 1.18 1.23  CKTOTAL 389  --   --   --      Estimated Creatinine Clearance: 47.8 mL/min (by C-G formula based on SCr of 1.23 mg/dL).   Medical History: Past Medical History:  Diagnosis Date   Diabetes mellitus without complication ([place])    History of kidney stones    Hyperlipidemia    Hypertension    Medications:  PTA: N/A Inpatient: Heparin infusion 1/24 >> Allergies: No AC/APT related allergies  Assessment: 77 year old male with PMH idabetes found on floor by EMS. Troponins increased from 1589 to 2131. EKG pending. Pharmacy consulted for heparin infusion in the setting of suspected ACS. Pt was in afib and will likely need DOAC at discharge.   Goal of Therapy:  Heparin level 0.3-0.7 units/ml Monitor platelets by anticoagulation protocol: Yes  1/25 0242 HL 0.41, therapeutic x 1 1/25 1330 HL 0.14, subtherapeutic 1/26 0028 HL 0.64, therapeutic x 1  1/26 0851 HL 0.55, therapeutic x 2 1/27 0433 HL 0.54, therapeutic x 3 1/28 0535 HL 0.49, therapeutic x 4 1/29 0430 HL 0.53, therapeutic x 5 1/30 0355 HL 0.59 1/31 0146 HL 0.45  Plan:  --Heparin level remains therapeutic  --Continue heparin infusion at 1150 units/hr --Re-check HL tomorrow AM --Daily CBC per protocol while on IV heparin  Dorothe Pea, PharmD, BCPS Clinical Pharmacist   08/07/2022 2:32 AM

## 2022-08-07 NOTE — Progress Notes (Signed)
Progress Note   Patient Name: Russell Byrd Date of Encounter: 08/07/2022  Primary Cardiologist: None Dr. Rockey Situ  Subjective   Afebrile overnight. Remains on cefepime.   No CP or SOB. Says his bottom hurts.   Remains in NSR  On heparin. Hgb continues to trend down slowly. No overt bleeding.   BR remains high   Inpatient Medications    Scheduled Meds:  aspirin EC  81 mg Oral Daily   insulin aspart  0-5 Units Subcutaneous QHS   insulin aspart  0-9 Units Subcutaneous TID WC   isosorbide dinitrate  20 mg Oral TID   lisinopril  20 mg Oral Daily   metoprolol tartrate  25 mg Oral BID   pantoprazole  20 mg Oral Daily   sodium chloride flush  3 mL Intravenous Q12H   Continuous Infusions:  sodium chloride     sodium chloride Stopped (08/02/22 0733)   ceFEPime (MAXIPIME) IV 2 g (08/07/22 0949)   heparin 1,150 Units/hr (08/07/22 0325)   PRN Meds: sodium chloride, acetaminophen **OR** acetaminophen, guaiFENesin, magnesium hydroxide, menthol-cetylpyridinium, ondansetron **OR** ondansetron (ZOFRAN) IV, sodium chloride flush, traZODone   Vital Signs    Vitals:   08/06/22 2039 08/07/22 0012 08/07/22 0341 08/07/22 0734  BP: (!) 152/65 (!) 157/64 (!) 170/73 (!) 154/67  Pulse: 64 68 75 69  Resp: '18 18 20 20  '$ Temp: 98.5 F (36.9 C) 98.9 F (37.2 C) 98.8 F (37.1 C) 98.9 F (37.2 C)  TempSrc:   Oral Oral  SpO2: 98% 97% 95% 98%  Weight:      Height:        Intake/Output Summary (Last 24 hours) at 08/07/2022 1003 Last data filed at 08/06/2022 2139 Gross per 24 hour  Intake 326.46 ml  Output 400 ml  Net -73.54 ml       08/01/2022    9:15 PM 07/21/2022    8:00 PM 06/24/2022    3:51 PM  Last 3 Weights  Weight (lbs) 164 lb 10.9 oz 175 lb 11.3 oz 175 lb 9.6 oz  Weight (kg) 74.7 kg 79.7 kg 79.652 kg      Telemetry    SR 60s Personally reviewed  Physical Exam   General:  Sitting in chair Weak appearing. No resp difficulty HEENT: normal Neck: supple. no JVD.  Carotids 2+ bilat; no bruits. No lymphadenopathy or thryomegaly appreciated. Cor: PMI nondisplaced. Regular rate & rhythm. No rubs, gallops or murmurs. Lungs: coarse Abdomen: soft, nontender, nondistended. No hepatosplenomegaly. No bruits or masses. Good bowel sounds. Extremities: no cyanosis, clubbing, rash, tr edema Neuro: alert & orientedx3, cranial nerves grossly intact. moves all 4 extremities w/o difficulty. Affect pleasant    Chemistry Recent Labs  Lab 08/04/22 0535 07/25/2022 0430 08/06/22 0355 08/07/22 0146  NA 134* 135 134* 134*  K 3.3* 4.4 4.1 4.3  CL 109 106 105 103  CO2 21* 21* 23 27  GLUCOSE 191* 178* 153* 149*  BUN 24* 25* 22 25*  CREATININE 1.23 1.11 1.18 1.23  CALCIUM 7.6* 7.9* 7.5* 7.8*  PROT 5.9* 5.9* 5.6*  --   ALBUMIN 2.5* 2.5* 2.3*  --   AST 40 64* 103*  --   ALT 35 52* 82*  --   ALKPHOS 49 47 50  --   BILITOT 0.6 0.6 0.6  --   GFRNONAA >60 >60 >60 >60  ANIONGAP 4* 8 6 4*      Hematology Recent Labs  Lab 07/09/2022 0430 08/06/22 0355 08/07/22 0146  WBC 5.8  6.6 8.5  RBC 3.20* 2.98* 2.83*  HGB 9.4* 8.8* 8.3*  HCT 27.6* 25.5* 24.5*  MCV 86.3 85.6 86.6  MCH 29.4 29.5 29.3  MCHC 34.1 34.5 33.9  RDW 12.0 12.1 12.0  PLT 193 183 209     Cardiac EnzymesNo results for input(s): "TROPONINI" in the last 168 hours. No results for input(s): "TROPIPOC" in the last 168 hours.   BNPNo results for input(s): "BNP", "PROBNP" in the last 168 hours.   DDimer No results for input(s): "DDIMER" in the last 168 hours.   Radiology    No results found.  Cardiac Studies   TTE 08/01/22 1. Left ventricular ejection fraction, by estimation, is 55 to 60%. The  left ventricle has normal function. The left ventricle has no regional  wall motion abnormalities. There is mild left ventricular hypertrophy.  Left ventricular diastolic parameters  are indeterminate.   2. Right ventricular systolic function is normal. The right ventricular  size is normal. There is  normal pulmonary artery systolic pressure.   3. The mitral valve is normal in structure. Mild to moderate mitral valve  regurgitation. No evidence of mitral stenosis.   4. The aortic valve is normal in structure. Aortic valve regurgitation is  mild. No aortic stenosis is present.   5. The inferior vena cava is normal in size with greater than 50%  respiratory variability, suggesting right atrial pressure of 3 mmHg.   Patient Profile     77 y.o. male with a history of gastroesophageal reflux disease, hypertension, hyperlipidemia, gait instability, type 2 diabetes, who is being seen and evaluated for atrial fibrillation with RVR and NSTEMI.   Assessment & Plan    NSTEMI -High-sensitivity troponins trended and peaked at 2867 -EKG was concerning for global ischemia with diffuse ST depression in setting of AF with RVR -2D echo though shows no regional wall motion normalities and normal LV function -No chest pain -Cardiac catheterization was planned for last week, but postponed due to mild confusion, the requirement of mittens as he had pulled out IVs and leads from his heart monitor -At that time, there was a long discussion had with patient and with the daughter who was in agreement with postponing procedure until at the beginning of this week -Now found to be COVID-positive on 1/27 -Now with fever of 102.4 during the evening of 1/29 - now afebrile. But hgb drifting down. If hgb stable x 24 hours will plan cath Friday -Continue aspirin 81 mg daily, Lopressor 25 mg twice daily, Isordil 10 mg 3 times daily and atorvastatin 20 mg daily and IV heparin drip  Atrial fibrillation RVR -Found down at home on the ground for 3 days, with UTI, presented with A-fib 150 bpm -Converted to sinus rhythm -Remains in normal sinus rhythm  -Continue on heparin drip until after heart catheterization transition to DOAC at discharge -CHA2DS2-VASc score of at least 4 and higher if he is found to have CAD -Remains  in NSR Continue Lopressor 25 mg twice daily   Rhabdomyolysis -Was down for approximately 3 days -CK total trending down, now normal -Will likely need PT/OT   UTI -Continued on broad-spectrum antibiotics -Daily BMP to monitor renal function currently high risk for ATN   Gastroesophageal reflux disease -Continue home PPI   Type 2 diabetes -Management per IM  Hypokalemia/hypomagnesemia -Repleted   HTN -BP remains high  -Increase lisinopril to 40 daily  Anemia: -Hgb trend to 8.8 -> 8.3 today, down from 13 six days prior -Remains on  heparin gtt -No obvious bleed -Will need hgb to be stable for 24-48 hours prior to catjh -Management per IM      For questions or updates, please contact [place] Please consult [email or web address] for contact info under        Signed, Glori Bickers, MD  08/07/2022, 10:03 AM

## 2022-08-07 NOTE — Evaluation (Signed)
Physical Therapy Evaluation Patient Details Name: Russell Byrd MRN: [id number] DOB: Nov 11, 1945 Today's Date: 08/07/2022  History of Present Illness  Patient is a 77 year old male with NSTEMI and atrial fibrillation with rapid ventricular response in the setting of altered mental status after lying on the floor for 3 days. Complicated by [id number] infection with cardiac catheterization on hold pending improvement in infection.  Clinical Impression  Patient is agreeable to PT evaluation with encouragement. The son was present at the start of session and reports patient is not at his baseline level for cognition, more confusion over the past several days. He is usually independent with activity and lives at home alone. Today the patient requires physical assistance with bed mobility and transfers with slow processing with mobility tasks. Sitting balance is poor initially with right side lean that improved with increased sitting time and facilitation for midline. Limited standing tolerance with generalized weakness. Heart rate in the 60's. The patient is not at his baseline level of functional mobility. Consider SNF for short term rehab depending on progress and family support at discharge. PT will continue to follow to maximize independence.      Recommendations for follow up therapy are one component of a multi-disciplinary discharge planning process, led by the attending physician.  Recommendations may be updated based on patient status, additional functional criteria and insurance authorization.  Follow Up Recommendations Skilled nursing-short term rehab (<3 hours/day) Can patient physically be transported by private vehicle: No    Assistance Recommended at Discharge Frequent or constant Supervision/Assistance  Patient can return home with the following  A lot of help with walking and/or transfers;A lot of help with bathing/dressing/bathroom;Assistance with cooking/housework;Direct  supervision/assist for medications management;Help with stairs or ramp for entrance;Assist for transportation    Equipment Recommendations  (to be determined at next level of care)  Recommendations for Other Services       Functional Status Assessment Patient has had a recent decline in their functional status and demonstrates the ability to make significant improvements in function in a reasonable and predictable amount of time.     Precautions / Restrictions Precautions Precautions: Fall Restrictions Weight Bearing Restrictions: No      Mobility  Bed Mobility Overal bed mobility: Needs Assistance Bed Mobility: Supine to Sit     Supine to sit: Mod assist, +2 for physical assistance     General bed mobility comments: increased time and effort required. cues for sequencing and task segmentation. assistance for trunk support and BLE support    Transfers Overall transfer level: Needs assistance Equipment used: Rolling walker (2 wheels) Transfers: Sit to/from Stand, Bed to chair/wheelchair/BSC Sit to Stand: Min assist, +2 physical assistance   Step pivot transfers: Min assist, +2 physical assistance       General transfer comment: verbal cues for technique. +2 person assistance required for safety.    Ambulation/Gait               General Gait Details: not attempted due to limited standing tolerance, generalized weakness  Stairs            Wheelchair Mobility    Modified Rankin (Stroke Patients Only)       Balance Overall balance assessment: Needs assistance Sitting-balance support: Feet supported Sitting balance-Leahy Scale: Poor Sitting balance - Comments: initially with right side lean and faciliation provided for safety Postural control: Right lateral lean Standing balance support: Bilateral upper extremity supported Standing balance-Leahy Scale: Poor Standing balance comment: Min A, external support  required to maintain standing balance                              Pertinent Vitals/Pain Pain Assessment Pain Assessment: Faces Faces Pain Scale: Hurts little more Pain Location: generalized, left arm, left thigh Pain Descriptors / Indicators: Discomfort Pain Intervention(s): Limited activity within patient's tolerance, Monitored during session    Home Living Family/patient expects to be discharged to:: Private residence Living Arrangements: Alone Available Help at Discharge: Family Type of Home: House Home Access: Stairs to enter   Technical brewer of Steps: 3   Home Layout: One level Home Equipment: Cane - single point      Prior Function Prior Level of Function : Independent/Modified Independent;Driving             Mobility Comments: independent without device. history of falls       Hand Dominance        Extremity/Trunk Assessment   Upper Extremity Assessment Upper Extremity Assessment: Generalized weakness    Lower Extremity Assessment Lower Extremity Assessment: Generalized weakness       Communication   Communication: No difficulties  Cognition Arousal/Alertness: Awake/alert Behavior During Therapy: WFL for tasks assessed/performed Overall Cognitive Status: Impaired/Different from baseline Area of Impairment: Orientation, Following commands, Safety/judgement, Problem solving                 Orientation Level: Disoriented to, Time     Following Commands: Follows one step commands inconsistently Safety/Judgement: Decreased awareness of deficits   Problem Solving: Slow processing, Decreased initiation, Difficulty sequencing, Requires tactile cues, Requires verbal cues General Comments: patient thinks it is evening time (currently around 9AM), some confusion about recent events and speech is tangential at times.        General Comments General comments (skin integrity, edema, etc.): heart rate in the 60's with mobility with no chest pain reported with  mobility.    Exercises     Assessment/Plan    PT Assessment Patient needs continued PT services  PT Problem List Decreased strength;Decreased range of motion;Decreased activity tolerance;Decreased balance;Decreased mobility;Decreased cognition;Decreased safety awareness;Decreased knowledge of precautions       PT Treatment Interventions DME instruction;Gait training;Stair training;Functional mobility training;Therapeutic activities;Therapeutic exercise;Balance training;Cognitive remediation;Neuromuscular re-education;Patient/family education    PT Goals (Current goals can be found in the Care Plan section)  Acute Rehab PT Goals Patient Stated Goal: none stated PT Goal Formulation: With patient Time For Goal Achievement: 08/21/22 Potential to Achieve Goals: Fair    Frequency Min 2X/week     Co-evaluation PT/OT/SLP Co-Evaluation/Treatment: Yes Reason for Co-Treatment: Complexity of the patient's impairments (multi-system involvement);To address functional/ADL transfers PT goals addressed during session: Mobility/safety with mobility         AM-PAC PT "6 Clicks" Mobility  Outcome Measure Help needed turning from your back to your side while in a flat bed without using bedrails?: A Lot Help needed moving from lying on your back to sitting on the side of a flat bed without using bedrails?: Total Help needed moving to and from a bed to a chair (including a wheelchair)?: Total Help needed standing up from a chair using your arms (e.g., wheelchair or bedside chair)?: Total Help needed to walk in hospital room?: Total Help needed climbing 3-5 steps with a railing? : Total 6 Click Score: 7    End of Session   Activity Tolerance: Patient tolerated treatment well Patient left: in chair;with call bell/phone within reach;with chair alarm  set;with family/visitor present Nurse Communication: Mobility status PT Visit Diagnosis: Unsteadiness on feet (R26.81);Muscle weakness  (generalized) (M62.81)    Time: 7290-2111 PT Time Calculation (min) (ACUTE ONLY): 34 min   Charges:   PT Evaluation $PT Eval Low Complexity: 1 Low PT Treatments $Therapeutic Activity: 8-22 mins        Minna Merritts, PT, MPT   Percell Locus 08/07/2022, 10:07 AM

## 2022-08-07 NOTE — NC FL2 (Signed)
[place] LEVEL OF CARE FORM     IDENTIFICATION  Patient Name: Russell Byrd Birthdate: 1946/01/11 Sex: male Admission Date (Current Location): 08/03/2022  [place] and [place] Number:  Engineering geologist and Address:  [facility], [address] [place], [place] [address]      Provider Number: [id number]  Attending Physician Name and Address:  Annita Brod, MD  Relative Name and Phone Number:  Annie Main [id number],[phone]    Current Level of Care: Hospital Recommended Level of Care: [place] Prior Approval Number:    Date Approved/Denied:   PASRR Number:    Discharge Plan: Home    Current Diagnoses: Patient Active Problem List   Diagnosis Date Noted   Delirium 08/02/2022   Elevated troponin I level 08/01/2022   Essential hypertension 08/01/2022   Type 2 diabetes mellitus without complications ([place]) 81/44/8185   Dyslipidemia 08/01/2022   Rhabdomyolysis 08/01/2022   Sepsis secondary to UTI ([place]) 08/01/2022   Atrial fibrillation with rapid ventricular response ([place]) 08/01/2022   Paroxysmal atrial fibrillation with RVR ([place]) 08/01/2022   NSTEMI (non-ST elevated myocardial infarction) ([place]) 08/01/2022   Sepsis due to gram-negative UTI ([place]) 07/11/2022   Atherosclerosis of aorta ([facility]) 05/27/2022   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   Senile purpura ([place]) 11/19/2021   Strain of right biceps muscle 03/22/2021   Controlled substance agreement signed 11/13/2020   Advanced care planning/counseling discussion 10/13/2017   Hypertension associated with diabetes ([place]) 07/15/2017   Hyperlipidemia 07/15/2017   Diabetes mellitus associated with hormonal etiology ([place]) 07/15/2017   BPH (benign prostatic hyperplasia) 10/07/2016   Primary osteoarthritis of right knee 04/04/2016   Chronic anxiety 04/03/2015    Orientation RESPIRATION BLADDER Height & Weight     Self  Normal External catheter Weight: 74.7  kg Height:  '5\' 7"'$  (170.2 cm)  BEHAVIORAL SYMPTOMS/MOOD NEUROLOGICAL BOWEL NUTRITION STATUS  Other (Comment) (n/a)  (n/a) Continent Diet (Heart Healthy)  AMBULATORY STATUS COMMUNICATION OF NEEDS Skin   Limited Assist Verbally Bruising                       Personal Care Assistance Level of Assistance  Bathing, Dressing Bathing Assistance: Limited assistance   Dressing Assistance: Limited assistance     Functional Limitations Info             SPECIAL CARE FACTORS FREQUENCY  PT (By licensed PT), OT (By licensed OT)     PT Frequency: Min 2x weekly OT Frequency: Min 2x weekly            Contractures Contractures Info: Present    Additional Factors Info  Code Status, Allergies Code Status Info: FULL Allergies Info: Amlodipine, Flomax (Tamsulosin), Hydrochlorothiazide, Prednisone, Tetracycline, Tetracyclines & Related, Tramadol           Current Medications (08/07/2022):  This is the current hospital active medication list Current Facility-Administered Medications  Medication Dose Route Frequency Provider Last Rate Last Admin   0.9 %  sodium chloride infusion  250 mL Intravenous PRN Hammock, Sheri, NP       0.9% sodium chloride infusion  1 mL/kg/hr Intravenous Continuous Gerrie Nordmann, NP   Stopped at 08/02/22 0733   acetaminophen (TYLENOL) tablet 1,000 mg  1,000 mg Oral Q6H PRN Lucienne Minks, MD   1,000 mg at 08/07/22 1008   Or   acetaminophen (TYLENOL) suppository 650 mg  650 mg Rectal Q6H PRN Lucienne Minks, MD   650 mg at 07/17/2022 [phone]  aspirin EC tablet 81 mg  81 mg Oral Daily Mansy, Jan A, MD   81 mg at 08/07/22 0948   guaiFENesin (ROBITUSSIN) 100 MG/5ML liquid 5 mL  5 mL Oral Q4H PRN Lucienne Minks, MD   5 mL at 08/04/22 2130   heparin ADULT infusion 100 units/mL (25000 units/214m)  1,150 Units/hr Intravenous Continuous POswald Hillock RPH 11.5 mL/hr at 08/07/22 1506 1,150 Units/hr at 08/07/22 1506   insulin aspart (novoLOG) injection 0-5 Units  0-5 Units  Subcutaneous QHS SLucienne Minks MD       insulin aspart (novoLOG) injection 0-9 Units  0-9 Units Subcutaneous TID WC SLucienne Minks MD   2 Units at 08/07/22 1206   isosorbide dinitrate (ISORDIL) tablet 20 mg  20 mg Oral TID SLucienne Minks MD   20 mg at 08/07/22 0948   [START ON 08/08/2022] lisinopril (ZESTRIL) tablet 40 mg  40 mg Oral Daily Bensimhon, DShaune Pascal MD       magnesium hydroxide (MILK OF MAGNESIA) suspension 30 mL  30 mL Oral Daily PRN Mansy, Jan A, MD       menthol-cetylpyridinium (CEPACOL) lozenge 3 mg  1 lozenge Oral PRN SLucienne Minks MD   3 mg at 08/04/22 1658   metoprolol tartrate (LOPRESSOR) tablet 25 mg  25 mg Oral BID GMinna Merritts MD   25 mg at 08/07/22 0948   ondansetron (ZOFRAN) tablet 4 mg  4 mg Oral Q6H PRN Mansy, Jan A, MD       Or   ondansetron ([facility] injection 4 mg  4 mg Intravenous Q6H PRN Mansy, Jan A, MD       pantoprazole (PROTONIX) EC tablet 20 mg  20 mg Oral Daily GMinna Merritts MD   20 mg at 08/07/22 0948   sodium chloride flush (NS) 0.9 % injection 3 mL  3 mL Intravenous Q12H Hammock, Sheri, NP   3 mL at 08/07/22 0948   sodium chloride flush (NS) 0.9 % injection 3 mL  3 mL Intravenous PRN HGerrie Nordmann NP       traZODone (DESYREL) tablet 25 mg  25 mg Oral QHS PRN Mansy, Jan A, MD   25 mg at 07/13/2022 2025     Discharge Medications: Please see discharge summary for a list of discharge medications.  Relevant Imaging Results:  Relevant Lab Results:   Additional Information SS# [id number] KLaurena Slimmer RN

## 2022-08-07 NOTE — Evaluation (Signed)
Occupational Therapy Evaluation Patient Details Name: Russell Byrd MRN: [id number] DOB: 03-22-1946 Today's Date: 08/07/2022   History of Present Illness Patient is a 77 year old male with NSTEMI and atrial fibrillation with rapid ventricular response in the setting of altered mental status after lying on the floor for 3 days. Complicated by [id number] infection with cardiac catheterization on hold pending improvement in infection.   Clinical Impression   Patient received for OT evaluation. See flowsheet below for details of function. Generally, patient requiring MOD A x2 for bed mobility, MIN A x2 for t/f to stand and sidestep to recliner, and MIN-MAX A for ADLs overall. Patient will benefit from continued OT while in acute care.      Recommendations for follow up therapy are one component of a multi-disciplinary discharge planning process, led by the attending physician.  Recommendations may be updated based on patient status, additional functional criteria and insurance authorization.   Follow Up Recommendations  Skilled nursing-short term rehab (<3 hours/day)     Assistance Recommended at Discharge Frequent or constant Supervision/Assistance  Patient can return home with the following Two people to help with walking and/or transfers;A lot of help with bathing/dressing/bathroom;Assistance with cooking/housework;Direct supervision/assist for medications management;Direct supervision/assist for financial management;Assist for transportation;Help with stairs or ramp for entrance    Functional Status Assessment  Patient has had a recent decline in their functional status and demonstrates the ability to make significant improvements in function in a reasonable and predictable amount of time.  Equipment Recommendations  Other (comment) (defer to next venue of care)    Recommendations for Other Services       Precautions / Restrictions Precautions Precautions: Fall Restrictions Weight  Bearing Restrictions: No      Mobility Bed Mobility Overal bed mobility: Needs Assistance Bed Mobility: Supine to Sit     Supine to sit: Mod assist, +2 for physical assistance     General bed mobility comments: increased time and effort required. cues for sequencing and task segmentation. assistance for trunk support and BLE support    Transfers Overall transfer level: Needs assistance Equipment used: Rolling walker (2 wheels) Transfers: Sit to/from Stand, Bed to chair/wheelchair/BSC Sit to Stand: Min assist, +2 physical assistance     Step pivot transfers: Min assist, +2 physical assistance     General transfer comment: verbal cues for technique. +2 person assistance required for safety.      Balance Overall balance assessment: Needs assistance Sitting-balance support: Feet supported Sitting balance-Leahy Scale: Poor Sitting balance - Comments: initially with right side lean and faciliation provided for safety Postural control: Right lateral lean Standing balance support: Bilateral upper extremity supported Standing balance-Leahy Scale: Poor Standing balance comment: Min A, external support required to maintain standing balance                           ADL either performed or assessed with clinical judgement   ADL Overall ADL's : Needs assistance/impaired   Eating/Feeding Details (indicate cue type and reason): Anticipate set up assist (hand grip functional); however, pt noted to have silverware with built-up handles in room; when OT placed it in front of him, pt stated those were not his; unclear if family has recently purchased and brought them. Lots of posessions in room (suitcases, bags, etc)   Grooming Details (indicate cue type and reason): anticipate set up bed level/chair level   Upper Body Bathing Details (indicate cue type and reason): anticiapte MIN A  from seated   Lower Body Bathing Details (indicate cue type and reason): anticipate MAX A from  seated   Upper Body Dressing Details (indicate cue type and reason): anticipate set up-MIN A (2/2 LUE pain today)   Lower Body Dressing Details (indicate cue type and reason): anticipate MAX A (dependent to don socks; decreased hip flexion 2/2 L hip pain)   Toilet Transfer Details (indicate cue type and reason): anticipate MIN A x2 to BSC only (able to sidestep t/f to chair today with this assist)   Toileting - Clothing Manipulation Details (indicate cue type and reason): anticipate MOD A (decreased balance in standing)   Tub/Shower Transfer Details (indicate cue type and reason): anticipate MIN A x2 to shower chair Functional mobility during ADLs: Minimal assistance;+2 for physical assistance General ADL Comments: Decreased activity tolerance, decreased ROM (especially L side)     Vision Patient Visual Report:  (Pt states that he wears glasses for reading only)       Perception     Praxis      Pertinent Vitals/Pain Pain Assessment Pain Assessment: Faces Faces Pain Scale: Hurts little more Pain Location: generalized, left arm, left thigh Pain Descriptors / Indicators: Discomfort Pain Intervention(s): Limited activity within patient's tolerance, Monitored during session     Hand Dominance     Extremity/Trunk Assessment Upper Extremity Assessment Upper Extremity Assessment: LUE deficits/detail LUE Deficits / Details: Pt with bruising on L arm; decreased desire for functional use during session, but pt would use if encouraged. Hand grip WNL. LUE: Unable to fully assess due to pain   Lower Extremity Assessment Lower Extremity Assessment: Generalized weakness;Defer to PT evaluation       Communication Communication Communication: HOH   Cognition Arousal/Alertness: Awake/alert Behavior During Therapy: WFL for tasks assessed/performed Overall Cognitive Status: Impaired/Different from baseline Area of Impairment: Orientation, Following commands, Safety/judgement, Problem  solving                 Orientation Level: Disoriented to, Time (able to give the month and year; thinks it is evening when actually it is morning)     Following Commands: Follows one step commands inconsistently Safety/Judgement: Decreased awareness of deficits   Problem Solving: Slow processing, Decreased initiation, Difficulty sequencing, Requires tactile cues, Requires verbal cues General Comments: Tangential. Pt thinks that he has been in the hospital for 3 weeks. Pt is able to read the clock on the wall accurately. Does know that he is in the hospital, in [place], at [place]  no shortness of breath noted; pt on room air; HR in 60's with mobility. No chest pain reported by pt during session.    Exercises     Shoulder Instructions      Home Living Family/patient expects to be discharged to:: Private residence Living Arrangements: Alone Available Help at Discharge: Family;Available PRN/intermittently Type of Home: House Home Access: Stairs to enter [employer] of Steps: 3   Home Layout: One level                   Additional Comments: Need to determine bathroom set up another session; pt with AMS and distractible today      Prior Functioning/Environment Prior Level of Function : Independent/Modified Independent;Driving             Mobility Comments: independent without device. history of falls ADLs Comments: (I) ADL/IADL.        OT Problem List: Decreased strength;Decreased activity tolerance;Impaired balance (sitting  and/or standing);Decreased cognition;Decreased safety awareness      OT Treatment/Interventions: Self-care/ADL training;Therapeutic exercise;DME and/or AE instruction;Therapeutic activities;Cognitive remediation/compensation    OT Goals(Current goals can be found in the care plan section) Acute Rehab OT Goals Patient Stated Goal: Pain to stop OT Goal Formulation: With patient Time For  Goal Achievement: 08/21/22 Potential to Achieve Goals: Good ADL Goals Pt Will Perform Grooming: with modified independence;standing Pt Will Perform Lower Body Dressing: with modified independence;sit to/from stand Pt Will Perform Toileting - Clothing Manipulation and hygiene: with modified independence;sit to/from stand Pt Will Perform Tub/Shower Transfer: with modified independence;shower seat;ambulating;rolling walker  OT Frequency: Min 2X/week    Co-evaluation PT/OT/SLP Co-Evaluation/Treatment: Yes     OT goals addressed during session: ADL's and self-care      AM-PAC OT "6 Clicks" Daily Activity     Outcome Measure Help from another person eating meals?: None Help from another person taking care of personal grooming?: None Help from another person toileting, which includes using toliet, bedpan, or urinal?: A Lot Help from another person bathing (including washing, rinsing, drying)?: A Lot Help from another person to put on and taking off regular upper body clothing?: A Little Help from another person to put on and taking off regular lower body clothing?: A Lot 6 Click Score: 17   End of Session Equipment Utilized During Treatment: Rolling walker (2 wheels) Nurse Communication: Mobility status  Activity Tolerance: Patient limited by fatigue;Patient limited by pain Patient left: in chair;with chair alarm set;with family/visitor present (son re-entering room at end of session (he left for session))  OT Visit Diagnosis: Unsteadiness on feet (R26.81);History of falling (Z91.81)                Time: 7425-9563 OT Time Calculation (min): 29 min Charges:  OT General Charges $OT Visit: 1 Visit OT Evaluation $OT Eval Moderate Complexity: 1 Mod  Johny Pitstick Carleene Mains, MS, OTR/L   Vania Rea 08/07/2022, 1:03 PM

## 2022-08-08 DIAGNOSIS — D62 Acute posthemorrhagic anemia: Secondary | ICD-10-CM

## 2022-08-08 DIAGNOSIS — A4150 Gram-negative sepsis, unspecified: Secondary | ICD-10-CM | POA: Diagnosis not present

## 2022-08-08 DIAGNOSIS — A419 Sepsis, unspecified organism: Secondary | ICD-10-CM | POA: Diagnosis not present

## 2022-08-08 DIAGNOSIS — I4891 Unspecified atrial fibrillation: Secondary | ICD-10-CM | POA: Diagnosis not present

## 2022-08-08 DIAGNOSIS — F03918 Unspecified dementia, unspecified severity, with other behavioral disturbance: Secondary | ICD-10-CM

## 2022-08-08 DIAGNOSIS — I214 Non-ST elevation (NSTEMI) myocardial infarction: Secondary | ICD-10-CM | POA: Diagnosis not present

## 2022-08-08 DIAGNOSIS — T796XXA Traumatic ischemia of muscle, initial encounter: Secondary | ICD-10-CM | POA: Diagnosis not present

## 2022-08-08 LAB — CBC
HCT: 20.4 % — ABNORMAL LOW (ref 39.0–52.0)
Hemoglobin: 6.8 g/dL — ABNORMAL LOW (ref 13.0–17.0)
MCH: 28.9 pg (ref 26.0–34.0)
MCHC: 33.3 g/dL (ref 30.0–36.0)
MCV: 86.8 fL (ref 80.0–100.0)
Platelets: 259 10*3/uL (ref 150–400)
RBC: 2.35 MIL/uL — ABNORMAL LOW (ref 4.22–5.81)
RDW: 12.3 % (ref 11.5–15.5)
WBC: 13 10*3/uL — ABNORMAL HIGH (ref 4.0–10.5)
nRBC: 0.2 % (ref 0.0–0.2)

## 2022-08-08 LAB — GLUCOSE, CAPILLARY
Glucose-Capillary: 206 mg/dL — ABNORMAL HIGH (ref 70–99)
Glucose-Capillary: 230 mg/dL — ABNORMAL HIGH (ref 70–99)
Glucose-Capillary: 196 mg/dL — ABNORMAL HIGH (ref 70–99)
Glucose-Capillary: 202 mg/dL — ABNORMAL HIGH (ref 70–99)

## 2022-08-08 LAB — URINALYSIS, W/ REFLEX TO CULTURE (INFECTION SUSPECTED)
Bilirubin Urine: NEGATIVE
Glucose, UA: NEGATIVE mg/dL
Ketones, ur: 5 mg/dL — AB
Leukocytes,Ua: NEGATIVE
Nitrite: NEGATIVE
Protein, ur: 100 mg/dL — AB
Specific Gravity, Urine: 1.038 — ABNORMAL HIGH (ref 1.005–1.030)
pH: 5 (ref 5.0–8.0)

## 2022-08-08 LAB — BASIC METABOLIC PANEL
Anion gap: 11 (ref 5–15)
BUN: 36 mg/dL — ABNORMAL HIGH (ref 8–23)
CO2: 20 mmol/L — ABNORMAL LOW (ref 22–32)
Calcium: 8.1 mg/dL — ABNORMAL LOW (ref 8.9–10.3)
Chloride: 104 mmol/L (ref 98–111)
Creatinine, Ser: 1.5 mg/dL — ABNORMAL HIGH (ref 0.61–1.24)
GFR, Estimated: 48 mL/min — ABNORMAL LOW (ref >60)
Glucose, Bld: 222 mg/dL — ABNORMAL HIGH (ref 70–99)
Potassium: 4.8 mmol/L (ref 3.5–5.1)
Sodium: 135 mmol/L (ref 135–145)

## 2022-08-08 LAB — PROCALCITONIN: Procalcitonin: 0.12 ng/mL

## 2022-08-08 LAB — PREPARE RBC (CROSSMATCH)

## 2022-08-08 LAB — ABO/RH: ABO/RH(D): O NEG

## 2022-08-08 LAB — HEPARIN LEVEL (UNFRACTIONATED): Heparin Unfractionated: 0.55 IU/mL (ref 0.30–0.70)

## 2022-08-08 MED ORDER — MORPHINE SULFATE (PF) 2 MG/ML IV SOLN
1.0000 mg | INTRAVENOUS | Status: DC | PRN
  Administered 2022-08-08 – 2022-08-12 (×17): 1 mg via INTRAVENOUS
  Filled 2022-08-08 (×17): qty 1

## 2022-08-08 MED ORDER — SODIUM CHLORIDE 0.9% IV SOLUTION: Freq: Once | INTRAVENOUS | Status: AC

## 2022-08-08 MED ORDER — TRAZODONE HCL 50 MG PO TABS
25.0000 mg | ORAL_TABLET | Freq: Every day | ORAL | Status: DC
  Administered 2022-08-10: 25 mg via ORAL
  Filled 2022-08-08: qty 1

## 2022-08-08 NOTE — TOC Progression Note (Signed)
Transition of Care [facility]) - Progression Note    Patient Details  Name: SHAURYA RAWDON MRN: [id number] Date of Birth: [birth year]  [facility]) CM/SW Contact  Laurena Slimmer, RN Phone Number: 08/08/2022, 11:53 AM  Clinical Narrative:    Spoke with patient's son to give  bed offers for  [facility], [facility], and [facility]. Patient son is agreeable to [facility]. He was advised authorization would have to be obtain.         Expected Discharge Plan and Services                                               Social Determinants of Health (SDOH) Interventions SDOH Screenings   Depression (PHQ2-9): Low Risk  (05/27/2022)  Tobacco Use: High Risk (06/24/2022)    Readmission Risk Interventions     No data to display

## 2022-08-08 NOTE — Progress Notes (Signed)
Occupational Therapy Treatment Patient Details Name: Russell Byrd MRN: [id number] DOB: 1945-11-06 Today's Date: 08/08/2022   History of present illness Patient is a 77 year old male with NSTEMI and atrial fibrillation with rapid ventricular response in the setting of altered mental status after lying on the floor for 3 days. Complicated by [id number] infection with cardiac catheterization on hold pending improvement in infection.   OT comments  Russell Byrd presents today with significant decline compared to yesterday's rehab evaluations. He is non-verbal today, unable to follow any directions or to assist in any ADL tasks. He moans frequently, and, based on PAINAD scale, appears to have severe pain (score 8/10). His b/l hands are clenched tightly. With assistance from therapist, pt is able to unclench L hand, unable unclench R. Nails of R hand are digging into pt's palm. Therapist places rolled washcloth in hand, places thumb in neutral posture. Attempt to engage pt in grooming task in supine and rolling in bed, but pt unable to participate. Therapist used pillows and bed features to reposition pt in bed for comfort and skin integrity. Pt's AM-PAC 6-Clicks Daily Activity Outcome Measure scoring dropping from 17 yesterday to 6 (=Total Assist in all ADL) today. MD notified.   Recommendations for follow up therapy are one component of a multi-disciplinary discharge planning process, led by the attending physician.  Recommendations may be updated based on patient status, additional functional criteria and insurance authorization.    Follow Up Recommendations  Skilled nursing-short term rehab (<3 hours/day)     Assistance Recommended at Discharge Frequent or constant Supervision/Assistance  Patient can return home with the following  Assistance with cooking/housework;Direct supervision/assist for medications management;Direct supervision/assist for financial management;Assist for transportation;Help  with stairs or ramp for entrance;A lot of help with bathing/dressing/bathroom;A lot of help with walking and/or transfers;Assistance with feeding   Equipment Recommendations       Recommendations for Other Services      Precautions / Restrictions Precautions Precautions: Fall Restrictions Weight Bearing Restrictions: No       Mobility Bed Mobility Overal bed mobility: Needs Assistance Bed Mobility: Rolling Rolling: Total assist         General bed mobility comments: Pt unable to assist in any meaningful way w/ rolling in bed; appears to be in increased discomfort with any movement of UE; b/l hands in tight fists    Transfers                   General transfer comment: Unable     Balance Overall balance assessment: Needs assistance   Sitting balance-Leahy Scale: Zero       Standing balance-Leahy Scale: Zero                             ADL either performed or assessed with clinical judgement   ADL Overall ADL's : Needs assistance/impaired Eating/Feeding: Total assistance   Grooming: Total assistance Grooming Details (indicate cue type and reason): Provided warm wet washcloth,encouraged pt to assist therapist in wiping his face and hands, but pt unable                             Functional mobility during ADLs: Total assistance;Maximal assistance General ADL Comments: today pt is Max-Total A for all ADL    Extremity/Trunk Assessment Upper Extremity Assessment Upper Extremity Assessment: LUE deficits/detail;RUE deficits/detail;Generalized weakness RUE Deficits / Details: fist tightly clenched  LUE Deficits / Details: extensive bruising, fist tightly clenched   Lower Extremity Assessment Lower Extremity Assessment: Generalized weakness        Vision       Perception     Praxis      Cognition Arousal/Alertness: Lethargic   Overall Cognitive Status: Impaired/Different from baseline                                  General Comments: Pt unable to speech, unable to follow simple directions        Exercises      Shoulder Instructions       General Comments      Pertinent Vitals/ Pain       Pain Assessment Breathing: occasional labored breathing, short period of hyperventilation Negative Vocalization: occasional moan/groan, low speech, negative/disapproving quality Facial Expression: facial grimacing Body Language: rigid, fists clenched, knees up, pushing/pulling away, strikes out Consolability: unable to console, distract or reassure PAINAD Score: 8 Pain Location: generalized, left arm, left thigh Pain Intervention(s): Repositioned, Other (comment) (notified RN and MD)  Home Living                                          Prior Functioning/Environment              Frequency  Min 2X/week        Progress Toward Goals  OT Goals(current goals can now be found in the care plan section)  Progress towards OT goals: OT to reassess next treatment  Acute Rehab OT Goals OT Goal Formulation: With patient Time For Goal Achievement: 08/21/22 Potential to Achieve Goals: Good  Plan Discharge plan remains appropriate;Frequency remains appropriate    Co-evaluation                 AM-PAC OT "6 Clicks" Daily Activity     Outcome Measure   Help from another person eating meals?: Total Help from another person taking care of personal grooming?: Total Help from another person toileting, which includes using toliet, bedpan, or urinal?: Total Help from another person bathing (including washing, rinsing, drying)?: Total Help from another person to put on and taking off regular upper body clothing?: Total Help from another person to put on and taking off regular lower body clothing?: Total 6 Click Score: 6    End of Session    OT Visit Diagnosis: Unsteadiness on feet (R26.81);History of falling (Z91.81);Muscle weakness (generalized) (M62.81)    Activity Tolerance Patient limited by pain;Treatment limited secondary to medical complications (Comment)   Patient Left in bed;with bed alarm set;with call bell/phone within reach   Nurse Communication          Time: 4431-5400 OT Time Calculation (min): 10 min  Charges: OT General Charges $OT Visit: 1 Visit OT Treatments $Self Care/Home Management : 8-22 mins   Josiah Lobo, PhD, MS, OTR/L 08/08/22, 12:13 PM

## 2022-08-08 NOTE — Progress Notes (Signed)
Progress Note   Patient: Russell Byrd RJJ:[id number] DOB: [birth year] DOA: 07/16/2022     8 DOS: the patient was seen and examined on 08/08/2022   Brief hospital course: 77 year old male with past medical history of paroxysmal atrial fibrillation, diabetes mellitus who is into the emergency room on 1/24 with confusion and fall and had been on the floor for 3 days prior to being found.  In the emergency room, patient found to have sepsis secondary to Enterobacter UTI as well as elevated.  Admitted to the hospitalist service and started on IV cefepime.  Troponins continue to trend upward and patient felt to have a non-STEMI.  Initial plan was for cardiac catheterization, but was delayed due to patient having some acute hospital delirium, likely in the setting of underlying dementia.  On 1/27, patient testing positive for COVID.  2/1, patient more confused, with decreased level of responsiveness.  Patient's hemoglobin down to 6.8 (was 8.3 the day prior).  White blood cell count elevated and procalcitonin minimally elevated.  Assessment and Plan: * Sepsis due to gram-negative UTI ([place]) with enterobacter aerogenes-solved Treated with IV fluids and completed 5-day cefepime course.  Given increasing white blood cell count and acute delirium, rechecking   Rhabdomyolysis-resolved Treated with IV fluids   NSTEMI/Elevated troponin I level - IV heparin drip with anti-coagulation monitoring by pharmacy Catheterization planned.  Initially held due to patient's acute delirium which is much improved.  Then noted to have fever and workup revealed COVID.  Cardiac cath once mentation and hemoglobin stabilized   Paroxysmal atrial fibrillation with RVR (HCC) -Heparin discontinued due to concerns for bleeding - Lopressor 25 mg PO bid    Dyslipidemia - Atorvastatin stopped due to rising LFTs (08/06/2022)   Type 2 diabetes mellitus without complications (HCC) - Novolog sliding scale tid and bedtime     Essential hypertension - Lopressor as above  - Isordil 20 mg PO tid  - Lisinopril 20 mg PO daily   Anemia Today down to 6.8.  Unclear etiology, questionable blood loss?  Transfusing 1 unit packed red blood cells and holding hemoglobin   COVID 19 infection Stable, not requiring medication at this time.  Not hypoxic  Senile dementia with acute behavioral disturbance Likely secondary to COVID and hospital delirium.  Had previously improved.  Now with decreased level of responsiveness of unclear etiology.  Could be new infection.  Overweight: Meets criteria BMI greater than 25   DVT prophylaxis: IV heparin drip as above  GI prophylaxis: Protonix 20 mg PO daily   Antibiotics: Complete 5-day course of cefepime Consultants: Cardiology Procedures: -Echocardiogram -Cardiac cath pending     Subjective: Patient confused, lying, minimal responsiveness  Physical Exam: Vitals:   08/08/22 0700 08/08/22 0800 08/08/22 1200 08/08/22 1208  BP:  116/75  104/74  Pulse: 82 83 72 74  Resp: 19 (!) 22 20 (!) 21  Temp:  98.8 F (37.1 C)    TempSrc:  Axillary    SpO2: 98% 99% 95% 100%  Weight:      Height:       HEENT: Normocephalic, atraumatic, mucous membranes are slightly dry Cardiovascular: Regular rate and rhythm, S1-S2 Pulmonary: Clear to auscultation bilaterally, poor inspiratory effort Abdominal:  Soft, ?nontender, nondistended, hypoactive bowel sounds Musculoskeletal:  No clubbing or cyanosis, trace pitting edema Skin: No skin breaks, tears or lesions Neurological:  No focal deficits Psychiatric:     No evidence of acute psychoses, confused but appropriate  Data Reviewed: Creatinine increased to 1.5 with GFR  48, procalcitonin mildly elevated 0.12, white blood cell count of 13 and hemoglobin down to 6.8  Disposition: Status is: Inpatient Prior to discharge needs -Improvement in mentation -Evaluation of possible infection -Evaluation of possible blood loss -Cardiac  cath      Author: Annita Brod, MD 08/08/2022 4:51 PM  For on call review [email or web address].

## 2022-08-08 NOTE — Progress Notes (Signed)
Rounding Note    Patient Name: Russell Byrd Date of Encounter: 08/08/2022  [place] Cardiologist: New to [place] this morning, no distress Minimally verbal, moaning hands clenched Unable to participate with PT/OT Lab work reviewed, drop in hemoglobin to 6.8, climb in WBC 13  Inpatient Medications    Scheduled Meds:  aspirin EC  81 mg Oral Daily   insulin aspart  0-5 Units Subcutaneous QHS   insulin aspart  0-9 Units Subcutaneous TID WC   isosorbide dinitrate  20 mg Oral TID   lisinopril  40 mg Oral Daily   metoprolol tartrate  25 mg Oral BID   pantoprazole  20 mg Oral Daily   sodium chloride flush  3 mL Intravenous Q12H   Continuous Infusions:  sodium chloride     sodium chloride Stopped (08/02/22 0733)   PRN Meds: sodium chloride, acetaminophen **OR** acetaminophen, guaiFENesin, magnesium hydroxide, menthol-cetylpyridinium, morphine injection, ondansetron **OR** ondansetron (ZOFRAN) IV, sodium chloride flush, traZODone   Vital Signs    Vitals:   08/08/22 0700 08/08/22 0800 08/08/22 1200 08/08/22 1208  BP:  116/75  104/74  Pulse: 82 83 72 74  Resp: 19 (!) 22 20 (!) 21  Temp:  98.8 F (37.1 C)    TempSrc:  Axillary    SpO2: 98% 99% 95% 100%  Weight:      Height:        Intake/Output Summary (Last 24 hours) at 08/08/2022 1307 Last data filed at 08/07/2022 2308 Gross per 24 hour  Intake 477.99 ml  Output 300 ml  Net 177.99 ml      08/01/2022    9:15 PM 07/28/2022    8:00 PM 06/24/2022    3:51 PM  Last 3 Weights  Weight (lbs) 164 lb 10.9 oz 175 lb 11.3 oz 175 lb 9.6 oz  Weight (kg) 74.7 kg 79.7 kg 79.652 kg      Telemetry    Atrial fibrillation- Personally Reviewed  ECG     - Personally Reviewed  Physical Exam   GEN: In bed, eyes closed, minimally arousable, nonverbal, moaning, hands clenched Neck: No JVD Cardiac: RRR, no murmurs, rubs, or gallops.  Respiratory: Clear to auscultation bilaterally. GI: Soft,   non-distended  MS: No edema; No deformity. Neuro: Lethargic Psych: Somnolent  Labs    High Sensitivity Troponin:   Recent Labs  Lab 07/15/2022 1747 07/28/2022 1924 08/01/22 0545 08/01/22 0802  TROPONINIHS 1,589* 2,131* 2,602* 2,867*     Chemistry Recent Labs  Lab 08/04/22 0535 07/28/2022 0430 08/06/22 0355 08/07/22 0146 08/08/22 0622  NA 134* 135 134* 134* 135  K 3.3* 4.4 4.1 4.3 4.8  CL 109 106 105 103 104  CO2 21* 21* 23 27 20*  GLUCOSE 191* 178* 153* 149* 222*  BUN 24* 25* 22 25* 36*  CREATININE 1.23 1.11 1.18 1.23 1.50*  CALCIUM 7.6* 7.9* 7.5* 7.8* 8.1*  MG 1.5* 2.0 1.9  --   --   PROT 5.9* 5.9* 5.6*  --   --   ALBUMIN 2.5* 2.5* 2.3*  --   --   AST 40 64* 103*  --   --   ALT 35 52* 82*  --   --   ALKPHOS 49 47 50  --   --   BILITOT 0.6 0.6 0.6  --   --   GFRNONAA >60 >60 >60 >60 48*  ANIONGAP 4* 8 6 4* 11    Lipids No results for input(s): "CHOL", "  TRIG", "HDL", "LABVLDL", "LDLCALC", "CHOLHDL" in the last 168 hours.  Hematology Recent Labs  Lab 08/06/22 0355 08/07/22 0146 08/08/22 0622  WBC 6.6 8.5 13.0*  RBC 2.98* 2.83* 2.35*  HGB 8.8* 8.3* 6.8*  HCT 25.5* 24.5* 20.4*  MCV 85.6 86.6 86.8  MCH 29.5 29.3 28.9  MCHC 34.5 33.9 33.3  RDW 12.1 12.0 12.3  PLT 183 209 259   Thyroid No results for input(s): "TSH", "FREET4" in the last 168 hours.  BNPNo results for input(s): "BNP", "PROBNP" in the last 168 hours.  DDimer No results for input(s): "DDIMER" in the last 168 hours.   Radiology    No results found.  Cardiac Studies   TTE 08/01/22 1. Left ventricular ejection fraction, by estimation, is 55 to 60%. The  left ventricle has normal function. The left ventricle has no regional  wall motion abnormalities. There is mild left ventricular hypertrophy.  Left ventricular diastolic parameters  are indeterminate.   2. Right ventricular systolic function is normal. The right ventricular  size is normal. There is normal pulmonary artery systolic pressure.    3. The mitral valve is normal in structure. Mild to moderate mitral valve  regurgitation. No evidence of mitral stenosis.   4. The aortic valve is normal in structure. Aortic valve regurgitation is  mild. No aortic stenosis is present.   5. The inferior vena cava is normal in size with greater than 50%  respiratory variability, suggesting right atrial pressure of 3 mmHg.   Patient Profile     77 y.o. male with a history of gastroesophageal reflux disease, hypertension, hyperlipidemia, gait instability, type 2 diabetes, who is being seen and evaluated for atrial fibrillation with RVR and NSTEMI.   Assessment & Plan    Non-STEMI After being found down, rapid atrial fibrillation, urinary tract infection, rhabdo Troponin 2600 Risk factors include hyperlipidemia, diabetes EKG concerning for anterolateral ischemia with ST depressions, unable to exclude old anterior MI Echocardiogram with EF 55 to 60% with no focal wall motion abnormality Catheterization initially planned for last week but  delayed given agitation, delirium, pulling on his IVs, Haldol given ,  in mitts Diagnosed with COVID, continued confusion Plan was for catheterization possibly tomorrow but now with worsening mental status, decline in hemoglobin less than 7 -Not a good candidate for catheterization today and likely tomorrow given the above, change in mental status, anemia   2)atrial fibrillation with RVR On the ground for 3 days, UTI, presenting with A-fib rate 150 bpm converted to normal sinus rhythm, normal sinus rhythm has maintained over the past week -Initially managed with heparin fusion, heparin now held in the setting of worsening anemia concerning for blood loss  continue metoprolol tartrate 25 twice daily NOAC on hold this admission given attempt to perform cardiac catheterization   3.  Urinary tract infection Treated with broad-spectrum antibiotics, blood pressure stable Climbing creatinine 1.5 up from 1.2  yesterday Oral intake may be poor, may need IV fluids at low rate   4.  Rhabdomyolysis On the ground for 3 days at home, soreness in bilateral shoulders on arrival Notes indicating gait instability at baseline Given mental status changes today, unable to work with PT OT Consider repeat UA, culture.  This growing Enterobacter 1 week ago Creatinine climbing, WBC higher   5. GERD  home PPI    Total encounter time more than 50 minutes  Greater than 50% was spent in counseling and coordination of care with the patient    For questions  or updates, please contact [place] Please consult [email or web address] for contact info under        Signed, Ida Rogue, MD  08/08/2022, 1:07 PM

## 2022-08-08 NOTE — Consult Note (Addendum)
ANTICOAGULATION CONSULT NOTE   Pharmacy Consult for Heparin Infusion Indication: chest pain/ACS and atrial fibrillation  Patient Measurements: Height: '5\' 7"'$  (170.2 cm) Weight: 74.7 kg (164 lb 10.9 oz) IBW/kg (Calculated) : 66.1 Heparin Dosing Weight: 79.7 kg  Labs: Recent Labs    08/06/22 0355 08/07/22 0146 08/08/22 0622  HGB 8.8* 8.3* 6.8*  HCT 25.5* 24.5* 20.4*  PLT 183 209 259  HEPARINUNFRC 0.59 0.45 0.55  CREATININE 1.18 1.23 1.50*     Estimated Creatinine Clearance: 39.2 mL/min (A) (by C-G formula based on SCr of 1.5 mg/dL (H)).   Medical History: Past Medical History:  Diagnosis Date   Diabetes mellitus without complication ([place])    History of kidney stones    Hyperlipidemia    Hypertension    Medications:  PTA: N/A Inpatient: Heparin infusion 1/24 >> Allergies: No AC/APT related allergies  Assessment: 77 year old male with PMH idabetes found on floor by EMS. Troponins increased from 1589 to 2131. EKG pending. Pharmacy consulted for heparin infusion in the setting of suspected ACS. Pt was in afib and will likely need DOAC at discharge. Hgb is trending down.   Goal of Therapy:  Heparin level 0.3-0.7 units/ml Monitor platelets by anticoagulation protocol: Yes  1/25 0242 HL 0.41, therapeutic x 1 1/25 1330 HL 0.14, subtherapeutic 1/26 0028 HL 0.64, therapeutic x 1  1/26 0851 HL 0.55, therapeutic x 2 1/27 0433 HL 0.54, therapeutic x 3 1/28 0535 HL 0.49, therapeutic x 4 1/29 0430 HL 0.53, therapeutic x 5 1/30 0355 HL 0.59 1/31 0146 HL 0.45 2/01 0622 HL 0.55  Plan:  --Heparin level remains therapeutic  --Continue heparin infusion at 1150 units/hr, but will hold currently due to drop in Hgb. Cards to re-assess.  --Re-check HL tomorrow AM when restarted.  --Daily CBC per protocol while on IV heparin  Oswald Hillock, PharmD, BCPS Clinical Pharmacist   08/08/2022 7:35 AM

## 2022-08-08 DEATH — deceased

## 2022-08-09 ENCOUNTER — Inpatient Hospital Stay: Payer: PPO

## 2022-08-09 DIAGNOSIS — R410 Disorientation, unspecified: Secondary | ICD-10-CM

## 2022-08-09 DIAGNOSIS — I214 Non-ST elevation (NSTEMI) myocardial infarction: Secondary | ICD-10-CM | POA: Diagnosis not present

## 2022-08-09 DIAGNOSIS — T796XXA Traumatic ischemia of muscle, initial encounter: Secondary | ICD-10-CM | POA: Diagnosis not present

## 2022-08-09 DIAGNOSIS — I639 Cerebral infarction, unspecified: Secondary | ICD-10-CM | POA: Insufficient documentation

## 2022-08-09 DIAGNOSIS — D62 Acute posthemorrhagic anemia: Secondary | ICD-10-CM | POA: Diagnosis not present

## 2022-08-09 DIAGNOSIS — D500 Iron deficiency anemia secondary to blood loss (chronic): Secondary | ICD-10-CM | POA: Diagnosis not present

## 2022-08-09 DIAGNOSIS — A4150 Gram-negative sepsis, unspecified: Secondary | ICD-10-CM | POA: Diagnosis not present

## 2022-08-09 DIAGNOSIS — I6310 Cerebral infarction due to embolism of unspecified precerebral artery: Secondary | ICD-10-CM

## 2022-08-09 DIAGNOSIS — A419 Sepsis, unspecified organism: Secondary | ICD-10-CM | POA: Diagnosis not present

## 2022-08-09 DIAGNOSIS — I4891 Unspecified atrial fibrillation: Secondary | ICD-10-CM | POA: Diagnosis not present

## 2022-08-09 LAB — BASIC METABOLIC PANEL
Anion gap: 11 (ref 5–15)
Anion gap: 9 (ref 5–15)
BUN: 51 mg/dL — ABNORMAL HIGH (ref 8–23)
BUN: 49 mg/dL — ABNORMAL HIGH (ref 8–23)
CO2: 21 mmol/L — ABNORMAL LOW (ref 22–32)
CO2: 23 mmol/L (ref 22–32)
Calcium: 7.9 mg/dL — ABNORMAL LOW (ref 8.9–10.3)
Calcium: 8 mg/dL — ABNORMAL LOW (ref 8.9–10.3)
Chloride: 107 mmol/L (ref 98–111)
Chloride: 108 mmol/L (ref 98–111)
Creatinine, Ser: 2.21 mg/dL — ABNORMAL HIGH (ref 0.61–1.24)
Creatinine, Ser: 1.95 mg/dL — ABNORMAL HIGH (ref 0.61–1.24)
GFR, Estimated: 30 mL/min — ABNORMAL LOW (ref >60)
GFR, Estimated: 35 mL/min — ABNORMAL LOW (ref >60)
Glucose, Bld: 181 mg/dL — ABNORMAL HIGH (ref 70–99)
Glucose, Bld: 181 mg/dL — ABNORMAL HIGH (ref 70–99)
Potassium: 4.5 mmol/L (ref 3.5–5.1)
Potassium: 4 mmol/L (ref 3.5–5.1)
Sodium: 139 mmol/L (ref 135–145)
Sodium: 140 mmol/L (ref 135–145)

## 2022-08-09 LAB — DIFFERENTIAL
Abs Immature Granulocytes: 0.86 10*3/uL — ABNORMAL HIGH (ref 0.00–0.07)
Basophils Absolute: 0 10*3/uL (ref 0.0–0.1)
Basophils Relative: 0 %
Eosinophils Absolute: 0 10*3/uL (ref 0.0–0.5)
Eosinophils Relative: 0 %
Immature Granulocytes: 6 %
Lymphocytes Relative: 8 %
Lymphs Abs: 1.2 10*3/uL (ref 0.7–4.0)
Monocytes Absolute: 1 10*3/uL (ref 0.1–1.0)
Monocytes Relative: 6 %
Neutro Abs: 12.4 10*3/uL — ABNORMAL HIGH (ref 1.7–7.7)
Neutrophils Relative %: 80 %

## 2022-08-09 LAB — CBC
HCT: 19.5 % — ABNORMAL LOW (ref 39.0–52.0)
HCT: 26.3 % — ABNORMAL LOW (ref 39.0–52.0)
Hemoglobin: 6.5 g/dL — ABNORMAL LOW (ref 13.0–17.0)
Hemoglobin: 8.9 g/dL — ABNORMAL LOW (ref 13.0–17.0)
MCH: 29.7 pg (ref 26.0–34.0)
MCH: 30.8 pg (ref 26.0–34.0)
MCHC: 33.3 g/dL (ref 30.0–36.0)
MCHC: 33.8 g/dL (ref 30.0–36.0)
MCV: 89 fL (ref 80.0–100.0)
MCV: 91 fL (ref 80.0–100.0)
Platelets: 274 10*3/uL (ref 150–400)
Platelets: 269 10*3/uL (ref 150–400)
RBC: 2.19 MIL/uL — ABNORMAL LOW (ref 4.22–5.81)
RBC: 2.89 MIL/uL — ABNORMAL LOW (ref 4.22–5.81)
RDW: 12.9 % (ref 11.5–15.5)
RDW: 13.1 % (ref 11.5–15.5)
WBC: 14.9 10*3/uL — ABNORMAL HIGH (ref 4.0–10.5)
WBC: 15.4 10*3/uL — ABNORMAL HIGH (ref 4.0–10.5)
nRBC: 0.8 % — ABNORMAL HIGH (ref 0.0–0.2)
nRBC: 0.5 % — ABNORMAL HIGH (ref 0.0–0.2)

## 2022-08-09 LAB — GLUCOSE, CAPILLARY
Glucose-Capillary: 186 mg/dL — ABNORMAL HIGH (ref 70–99)
Glucose-Capillary: 179 mg/dL — ABNORMAL HIGH (ref 70–99)
Glucose-Capillary: 183 mg/dL — ABNORMAL HIGH (ref 70–99)
Glucose-Capillary: 203 mg/dL — ABNORMAL HIGH (ref 70–99)
Glucose-Capillary: 154 mg/dL — ABNORMAL HIGH (ref 70–99)

## 2022-08-09 LAB — TECHNOLOGIST SMEAR REVIEW: Plt Morphology: NORMAL

## 2022-08-09 LAB — PREPARE RBC (CROSSMATCH)

## 2022-08-09 LAB — BRAIN NATRIURETIC PEPTIDE
B Natriuretic Peptide: 278.2 pg/mL — ABNORMAL HIGH (ref 0.0–100.0)
B Natriuretic Peptide: 420 pg/mL — ABNORMAL HIGH (ref 0.0–100.0)

## 2022-08-09 LAB — MRSA NEXT GEN BY PCR, NASAL: MRSA by PCR Next Gen: NOT DETECTED

## 2022-08-09 MED ORDER — LEVETIRACETAM IN NACL 1500 MG/100ML IV SOLN
1500.0000 mg | Freq: Once | INTRAVENOUS | Status: AC
  Administered 2022-08-09: 1500 mg via INTRAVENOUS
  Filled 2022-08-09: qty 100

## 2022-08-09 MED ORDER — STROKE: EARLY STAGES OF RECOVERY BOOK: Freq: Once | Status: AC

## 2022-08-09 MED ORDER — ADULT MULTIVITAMIN W/MINERALS CH
1.0000 | ORAL_TABLET | Freq: Every day | ORAL | Status: DC
  Administered 2022-08-11: 1 via ORAL
  Filled 2022-08-09 (×2): qty 1

## 2022-08-09 MED ORDER — VITAMIN C 500 MG PO TABS
500.0000 mg | ORAL_TABLET | Freq: Two times a day (BID) | ORAL | Status: DC
  Administered 2022-08-10 – 2022-08-11 (×2): 500 mg via ORAL
  Filled 2022-08-09 (×3): qty 1

## 2022-08-09 MED ORDER — SODIUM CHLORIDE 0.9% IV SOLUTION: Freq: Once | INTRAVENOUS | Status: DC

## 2022-08-09 MED ORDER — SODIUM CHLORIDE 0.9 % IV SOLN: INTRAVENOUS | Status: DC

## 2022-08-09 MED ORDER — FUROSEMIDE 10 MG/ML IJ SOLN
20.0000 mg | Freq: Once | INTRAMUSCULAR | Status: AC
  Administered 2022-08-09: 20 mg via INTRAVENOUS
  Filled 2022-08-09: qty 2

## 2022-08-09 MED ORDER — ZINC SULFATE 220 (50 ZN) MG PO CAPS
220.0000 mg | ORAL_CAPSULE | Freq: Every day | ORAL | Status: DC
  Administered 2022-08-11: 220 mg via ORAL
  Filled 2022-08-09 (×2): qty 1

## 2022-08-09 MED ORDER — METOPROLOL TARTRATE 5 MG/5ML IV SOLN
INTRAVENOUS | Status: AC
  Filled 2022-08-09: qty 5

## 2022-08-09 MED ORDER — METOPROLOL TARTRATE 5 MG/5ML IV SOLN
2.5000 mg | Freq: Four times a day (QID) | INTRAVENOUS | Status: DC | PRN
  Administered 2022-08-09: 2.5 mg via INTRAVENOUS
  Filled 2022-08-09: qty 5

## 2022-08-09 MED ORDER — SODIUM CHLORIDE 0.9% IV SOLUTION: Freq: Once | INTRAVENOUS | Status: AC

## 2022-08-09 MED ORDER — CHLORHEXIDINE GLUCONATE CLOTH 2 % EX PADS
6.0000 | MEDICATED_PAD | Freq: Every day | CUTANEOUS | Status: DC
  Administered 2022-08-09 – 2022-08-17 (×9): 6 via TOPICAL

## 2022-08-09 MED ORDER — GADOBUTROL 1 MMOL/ML IV SOLN
8.0000 mL | Freq: Once | INTRAVENOUS | Status: AC | PRN
  Administered 2022-08-09: 8 mL via INTRAVENOUS

## 2022-08-09 MED ORDER — DILTIAZEM HCL-DEXTROSE 125-5 MG/125ML-% IV SOLN (PREMIX)
5.0000 mg/h | INTRAVENOUS | Status: DC
  Administered 2022-08-09: 15 mg/h via INTRAVENOUS
  Administered 2022-08-09: 5 mg/h via INTRAVENOUS
  Filled 2022-08-09 (×2): qty 125

## 2022-08-09 MED ORDER — LORAZEPAM 2 MG/ML IJ SOLN
0.2500 mg | INTRAMUSCULAR | Status: DC | PRN
  Administered 2022-08-09 – 2022-08-12 (×7): 0.25 mg via INTRAVENOUS
  Filled 2022-08-09 (×7): qty 1

## 2022-08-09 MED ORDER — METOPROLOL TARTRATE 5 MG/5ML IV SOLN
5.0000 mg | Freq: Four times a day (QID) | INTRAVENOUS | Status: DC
  Administered 2022-08-10: 5 mg via INTRAVENOUS
  Filled 2022-08-09 (×2): qty 5

## 2022-08-09 NOTE — Progress Notes (Signed)
Progress Note   Patient: Russell Byrd OOI:[id number] DOB: Aug 10, 1945 DOA: 08/01/2022     9 DOS: the patient was seen and examined on 08/09/2022   Brief hospital course: 78 year old male with past medical history of paroxysmal atrial fibrillation, diabetes mellitus who is into the emergency room on 1/24 with confusion and fall and had been on the floor for 3 days prior to being found.  In the emergency room, patient found to have sepsis secondary to Enterobacter UTI as well as elevated.  Admitted to the hospitalist service and started on IV cefepime.  Troponins continue to trend upward and patient felt to have a non-STEMI.  Initial plan was for cardiac catheterization, but was delayed due to patient having some acute hospital delirium, likely in the setting of underlying dementia.  On 1/27, patient testing positive for COVID.  2/1, patient more confused, with decreased level of responsiveness.  Patient's hemoglobin down to 6.8 (was 8.3 the day prior) with creatinine at 1.5, previously normal.  White blood cell count elevated, but infection workup negative.  Patient ordered 1 unit packed red blood cells.  Heparin stopped at 8 AM.  2/2: Patient about the same, now with facial droop.  Stat CT scan of head done this morning noting 2 areas of low-density in right lower occipital temporal lobe and lower right frontal lobe concerning for acute/subacute infarcts.  In addition, despite 1 unit packed red blood cell transfusion on previous day, hemoglobin down to 6.5 and creatinine up further to 2.3.  Patient ordered 2 more units packed red blood cells.  GI consulted and given acute CVA/COVID/A-fib/non-STEMI, recommending conservative measures at this time.  MRI ordered and neurology consulted.  By late morning, patient developed rapid atrial fibrillation and started on Cardizem drip and transferred to stepdown unit.  Assessment and Plan: Acute embolic CVA: Multi embolic.  Neurology consulted.  MRI pending.   Curiously, patient's delirium started in the early morning hours of 2/1, prior to heparin being discontinued.  It is unclear if CVAs occurred after heparin discontinued Timeframe seems too short.  Given period of time that he was initially down prior to coming into the hospital, check lower extremity Dopplers which were negative for DVT as an additional source for embolic phenomenon.  MRI should help confirm timeframe of CVAs.  Not candidate for anticoagulation given bleeding.   Sepsis due to gram-negative UTI (HCC) with enterobacter aerogenes-resolved Treated with IV fluids and completed 5-day cefepime course.  Given increasing white blood cell count and acute delirium, rechecking   Rhabdomyolysis-resolved Treated with IV fluids   NSTEMI/Elevated troponin I level - IV heparin drip with anti-coagulation monitoring by pharmacy Catheterization planned.  Initially held due to patient's acute delirium which is much improved.  Then noted to have fever and workup revealed COVID.  Cardiac cath once mentation and hemoglobin stabilized.  Rechecking labs concerning for demand ischemia from drop in the blood.   Paroxysmal atrial fibrillation with RVR (HCC) -Heparin discontinued due to concerns for bleeding. Started having much more rapid ventricular rate on 2/2 and placed on Cardizem drip plus scheduled IV Lopressor.   Dyslipidemia - Atorvastatin stopped due to rising LFTs (08/06/2022)   Type 2 diabetes mellitus without complications (HCC) - Novolog sliding scale tid and bedtime    Essential hypertension - Lopressor as above  - Isordil 20 mg PO tid  - Lisinopril held due to concerns for renal failure and hypotension  ?  Blood loss anemia Hemoglobin down to 6.8 despite blood, went down  further with increase in creatinine.  GI consulted although noted signs noted of overt bleeding.  At this time, recommendation is for conservative measures and no scope.  Replacing hemoglobin.  Recheck CBC this  afternoon posttransfusion and transfuse further if needed.   COVID 19 infection Stable, not requiring medication at this time.  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  Then improved, then decline starting 2/1.  Possibly secondary to acute CVA.    Overweight: Meets criteria BMI greater than 25   DVT prophylaxis: IV heparin drip as above  GI prophylaxis: Protonix 20 mg PO daily   Antibiotics: Complete 5-day course of cefepime  Consultants: -Cardiology -Gastroenterology -Neurology -Discussed with critical care  Procedures: -Echocardiogram -Cardiac cath pending     Subjective: Patient confused, moans, difficult to interact with.  Physical Exam: Vitals:   08/09/22 1415 08/09/22 1430 08/09/22 1445 08/09/22 1606  BP:    108/73  Pulse: (!) 119 (!) 130 (!) 130 (!) 127  Resp: (!) 29 (!) 41 (!) 28 (!) 28  Temp:    99.6 F (37.6 C)  TempSrc:    Axillary  SpO2: 97% 100% 97%   Weight:      Height:       HEENT: Normocephalic, atraumatic, mucous membranes are slightly dry, right facial droop. Cardiovascular: Irregular rhythm, rate controlled Pulmonary: Clear to auscultation bilaterally, poor inspiratory effort Abdominal:  Soft, does not appear to have any abdominal pain, nondistended, hypoactive bowel sounds Musculoskeletal:  No clubbing or cyanosis, trace pitting edema Skin: No skin breaks, tears or lesions Neurological:  \Limited exam due to compliance.  Facial droop is noteworthy.  Unable to participate in finger-to-nose, strength exam Psychiatric:     Acutely confused  Data Reviewed: Worsening creatinine at 2.3.  Hemoglobin down to 6.5.  BNP at 278.  White blood cell count up to 14.9.  Disposition: Status is: Inpatient Prior to discharge needs -Improvement in mentation -Further evaluation of embolic CVA -Stabilization of hemoglobin -Cardiac cath -Disposition determination  once stabilized -Control of atrial fibrillation    65 minutes spent in the care of this critically ill patient including medical decision making, discussion of care with specialists, review and interpretation of labs and radiology studies and physical examination.  Author: Annita Brod, MD 08/09/2022 5:25 PM  For on call review [email or web address].

## 2022-08-09 NOTE — Consult Note (Signed)
NEUROLOGY CONSULTATION NOTE   Date of service: August 09, 2022 Patient Name: Russell Byrd MRN:  [id number] DOB:  05-Feb-1946 Reason for consult: multifocal ischemic infarcts Requesting physician: Dr. Gevena Barre _ _ _   _ __   _ __ _ _  __ __   _ __   __ _  History of Present Illness   77 yo gentleman presenting with sepsis 2/2 UTI (treated), COVID (+), new dx a fib and NSTEMI on whom neurology is consulted for acute infarcts. He was on heparin gtt for a fib until 2/1 when his Hgb started to drop and it was placed on hold. 12 hrs prior to that he became confused and then he developed L facial droop on 2/2 prompting a CT head showing multiple infarcts. Stroke workup:  CT head wo contrast 1. New areas of low-density within the lower RIGHT occipital-temporal lobe and lower RIGHT frontal lobe, consistent with edema, and consistent with acute-to-subacute infarcts. Given the multifocality, findings are suspicious for emboli. Recommend brain MRI for further characterization. 2. No intracranial hemorrhage. 3. Chronic small vessel ischemic changes within the bilateral periventricular white matter regions.  CT personally reviewed; I agree with above interpretation  BLE dopplers - no e/o DVT  TTE 1/25: no intracardiac clot  Stroke Labs     Component Value Date/Time   CHOL 101 08/10/2022 0347   CHOL 135 05/27/2022 1556   CHOL 127 04/20/2018 1619   TRIG 164 (H) 08/10/2022 0347   TRIG 156 (H) 04/20/2018 1619   HDL 22 (L) 08/10/2022 0347   HDL 35 (L) 05/27/2022 1556   CHOLHDL 4.6 08/10/2022 0347   VLDL 33 08/10/2022 0347   VLDL 31 (H) 04/20/2018 1619   LDLCALC 46 08/10/2022 0347   LDLCALC 76 05/27/2022 1556   LABVLDL 24 05/27/2022 1556    Lab Results  Component Value Date/Time   HGBA1C 7.3 (H) 08/01/2022 12:48 PM   HGBA1C 8.1 (H) 05/27/2022 03:54 PM      ROS   UTA 2/2 mental status  Past History   I have reviewed the following:  Past Medical History:  Diagnosis  Date   Diabetes mellitus without complication ([place])    History of kidney stones    Hyperlipidemia    Hypertension    Past Surgical History:  Procedure Laterality Date   KNEE SURGERY Bilateral    arthroscopic   Family History  Problem Relation Age of Onset   Diabetes Father    Heart disease Father    Diabetes Mother    Social History   Socioeconomic History   Marital status: Married    Spouse name: Not on file   Number of children: Not on file   Years of education: Not on file   Highest education level: Not on file  Occupational History   Not on file  Tobacco Use   Smoking status: Never   Smokeless tobacco: Current    Types: Chew  Vaping Use   Vaping Use: Never used  Substance and Sexual Activity   Alcohol use: Not Currently   Drug use: No   Sexual activity: Not Currently  Other Topics Concern   Not on file  Social History Narrative   Not on file   Social Determinants of Health   Financial Resource Strain: Not on file  Food Insecurity: Not on file  Transportation Needs: Not on file  Physical Activity: Not on file  Stress: Not on file  Social Connections: Not on file   Allergies  Allergen Reactions   Amlodipine Swelling   Flomax [Tamsulosin] Swelling    Pt states swelling to face and swelling to lower extremities   Hydrochlorothiazide Other (See Comments)    Urinary frequency   Prednisone Other (See Comments)    steroids   Tetracycline     Other reaction(s): Unknown   Tetracyclines & Related    Tramadol Other (See Comments)    Keeps him awake    Medications   Medications Prior to Admission  Medication Sig Dispense Refill Last Dose   Acetaminophen (TYLENOL 8 HOUR PO) Take by mouth daily.   Past Week   atenolol (TENORMIN) 25 MG tablet Take 1 tablet (25 mg total) by mouth 2 (two) times daily. 90 tablet 1 Past Week   atorvastatin (LIPITOR) 20 MG tablet Take 1 tablet (20 mg total) by mouth daily. 90 tablet 1 Past Week at 2000   benazepril (LOTENSIN)  40 MG tablet Take 1 tablet (40 mg total) by mouth daily. 90 tablet 0 Past Week   lansoprazole (PREVACID) 30 MG capsule Take 1 capsule (30 mg total) by mouth daily at 12 noon. 90 capsule 0 Past Week at 1200   metFORMIN (GLUCOPHAGE) 500 MG tablet Take 2 tablets (1,000 mg total) by mouth 2 (two) times daily with a meal. 360 tablet 0 Past Week   Misc Natural Products (OSTEO BI-FLEX/5-LOXIN ADVANCED PO) Take by mouth.   Past Week   Multiple Vitamins-Minerals (CENTRUM SILVER ADULT 50+) TABS Take by mouth daily.   Past Week   vitamin E 400 UNIT capsule Take 400 Units by mouth daily.   Past Week   Zinc 50 MG CAPS Take 50 mg by mouth daily.   Past Week   blood glucose meter kit and supplies KIT Dispense based on patient and insurance preference; One touch. Use up to three times daily as directed. (FOR ICD-9 250.00, 250.01). 1 each 12    hydrALAZINE (APRESOLINE) 10 MG tablet Take 0.5 tablets (5 mg total) by mouth 3 (three) times daily. (Patient not taking: Reported on 07/18/2022) 45 tablet 0 Not Taking   Ibuprofen 200 MG CAPS Take by mouth.      OneTouch Delica Lancets 21J MISC Apply topically.      ONETOUCH ULTRA test strip 1 each 2 (two) times daily.         Current Facility-Administered Medications:    [START ON 08/10/2022]  stroke: early stages of recovery book, , Does not apply, Once, Derek Jack, MD   0.9 %  sodium chloride infusion (Manually program via Guardrails IV Fluids), , Intravenous, Once, Annita Brod, MD   0.9 %  sodium chloride infusion, , Intravenous, Continuous, Annita Brod, MD   acetaminophen (TYLENOL) tablet 1,000 mg, 1,000 mg, Oral, Q6H PRN, 1,000 mg at 08/07/22 2037 **OR** acetaminophen (TYLENOL) suppository 650 mg, 650 mg, Rectal, Q6H PRN, Annita Brod, MD, 650 mg at 07/15/2022 9417   ascorbic acid (VITAMIN C) tablet 500 mg, 500 mg, Oral, BID, Annita Brod, MD   aspirin EC tablet 81 mg, 81 mg, Oral, Daily, Annita Brod, MD, 81 mg at 08/07/22 0948    Chlorhexidine Gluconate Cloth 2 % PADS 6 each, 6 each, Topical, Daily, Annita Brod, MD, 6 each at 08/09/22 1300   diltiazem (CARDIZEM) 125 mg in dextrose 5% 125 mL (1 mg/mL) infusion, 5-15 mg/hr, Intravenous, Titrated, Annita Brod, MD, Last Rate: 15 mL/hr at 08/09/22 1700, 15 mg/hr at 08/09/22 1700   guaiFENesin (ROBITUSSIN) 100 MG/5ML  liquid 5 mL, 5 mL, Oral, Q4H PRN, Annita Brod, MD, 5 mL at 08/04/22 2130   insulin aspart (novoLOG) injection 0-5 Units, 0-5 Units, Subcutaneous, QHS, Annita Brod, MD, 2 Units at 08/08/22 2119   insulin aspart (novoLOG) injection 0-9 Units, 0-9 Units, Subcutaneous, TID WC, Annita Brod, MD, 3 Units at 08/09/22 1701   isosorbide dinitrate (ISORDIL) tablet 20 mg, 20 mg, Oral, TID, Annita Brod, MD, 20 mg at 08/07/22 2037   magnesium hydroxide (MILK OF MAGNESIA) suspension 30 mL, 30 mL, Oral, Daily PRN, Annita Brod, MD   menthol-cetylpyridinium (CEPACOL) lozenge 3 mg, 1 lozenge, Oral, PRN, Annita Brod, MD, 3 mg at 08/04/22 1658   metoprolol tartrate (LOPRESSOR) injection 2.5 mg, 2.5 mg, Intravenous, Q6H PRN, Annita Brod, MD, 2.5 mg at 08/09/22 1158   metoprolol tartrate (LOPRESSOR) injection 5 mg, 5 mg, Intravenous, Q6H, Annita Brod, MD   morphine (PF) 2 MG/ML injection 1 mg, 1 mg, Intravenous, Q3H PRN, Annita Brod, MD, 1 mg at 08/09/22 1014   multivitamin with minerals tablet 1 tablet, 1 tablet, Oral, Daily, Annita Brod, MD   ondansetron (ZOFRAN) tablet 4 mg, 4 mg, Oral, Q6H PRN **OR** ondansetron (ZOFRAN) injection 4 mg, 4 mg, Intravenous, Q6H PRN, Annita Brod, MD   pantoprazole (PROTONIX) EC tablet 20 mg, 20 mg, Oral, Daily, Gevena Barre K, MD, 20 mg at 08/07/22 0948   traZODone (DESYREL) tablet 25 mg, 25 mg, Oral, QHS, Annita Brod, MD   zinc sulfate capsule 220 mg, 220 mg, Oral, Daily, Annita Brod, MD  Vitals   Vitals:   08/09/22 1645 08/09/22 1700 08/09/22  1715 08/09/22 1800  BP:  116/66  (!) 113/59  Pulse: (!) 130 (!) 129 (!) 130 (!) 102  Resp: (!) 30 (!) 21 (!) 27 (!) 25  Temp:  99.4 F (37.4 C)    TempSrc:      SpO2: 97% 99% 98% 98%  Weight:      Height:         Body mass index is 26.45 kg/m.  Physical Exam   Gen: alert, altered Resp: normal WOB CV: RRR   MS: alert, altered, does not attend to examiner, does not answer orientation questions or follow simple commands Speech: no intelligible speech CN: PERRL, does not blink to threat, (+) corneals and oculocephalics, UMN L facial droop Motor & sensory: flexion in BUE to noxious stimuli, withdrawal BLE to noxious stimuli Reflexes: 2+ symm throughout toes w/d only bilat Coordination, gait: UTA            NIHSS components Score: Comment  1a Level of Conscious 0'[x]'$  1'[]'$  2'[]'$  3'[]'$         1b LOC Questions 0'[]'$  1'[]'$  2'[x]'$           1c LOC Commands 0'[]'$  1'[]'$  2'[x]'$           2 Best Gaze 0'[x]'$  1'[]'$  2'[]'$           3 Visual 0'[x]'$  1'[]'$  2'[]'$  3'[]'$         4 Facial Palsy 0'[]'$  1'[x]'$  2'[]'$  3'[]'$         5a Motor Arm - left 0'[]'$  1'[]'$  2'[x]'$  3'[]'$  4'[]'$  UN'[]'$     5b Motor Arm - Right 0'[]'$  1'[]'$  2'[x]'$  3'[]'$  4'[]'$  UN'[]'$     6a Motor Leg - Left 0'[]'$  1'[]'$  2'[]'$  3'[]'$  4'[x]'$  UN'[]'$     6b Motor Leg - Right 0'[]'$  1'[]'$  2'[]'$  3'[]'$  4'[x]'$  UN'[]'$     7 Limb Ataxia 0'[x]'$   1$'[]'L$  2'[]'$  3'[]'$  UN'[]'$       8 Sensory 0'[x]'$  1'[]'$  2'[]'$  UN'[]'$         9 Best Language 0'[]'$  1'[]'$  2'[x]'$  3'[]'$         10 Dysarthria 0'[]'$  1'[]'$  2'[x]'$  UN'[]'$         11 Extinct. and Inattention 0'[x]'$  1'[]'$  2'[]'$           TOTAL:  21       Labs   CBC:  Recent Labs  Lab 08/08/22 0622 08/09/22 0257  WBC 13.0* 14.9*  HGB 6.8* 6.5*  HCT 20.4* 19.5*  MCV 86.8 89.0  PLT 259 248    Basic Metabolic Panel:  Lab Results  Component Value Date   NA 139 08/09/2022   K 4.5 08/09/2022   CO2 21 (L) 08/09/2022   GLUCOSE 181 (H) 08/09/2022   BUN 51 (H) 08/09/2022   CREATININE 2.21 (H) 08/09/2022   CALCIUM 7.9 (L) 08/09/2022   GFRNONAA 30 (L) 08/09/2022   GFRAA 73 04/17/2020   Lipid Panel:  Lab Results  Component Value Date    LDLCALC 76 05/27/2022   HgbA1c:  Lab Results  Component Value Date   HGBA1C 7.3 (H) 08/01/2022   Urine Drug Screen: No results found for: "LABOPIA", "COCAINSCRNUR", "LABBENZ", "AMPHETMU", "THCU", "LABBARB"  Alcohol Level No results found for: "ETH"   Impression   77 yo gentleman presenting with sepsis 2/2 UTI (treated), COVID (+), new dx a fib and NSTEMI on whom neurology is consulted for acute infarcts. He was on heparin gtt for a fib until 2/1 when his Hgb started to drop and it was placed on hold. 12 hrs prior to that he became confused and then he developed L facial droop on 2/2 prompting a CT head showing multiple infarcts. He required blood today and yesterday and source of bleeding has not been identified. He remains altered but protecting his airway.   Recommendations   - Permissive HTN x48 hrs from sx onset or until stroke ruled out by MRI goal BP <220/110. PRN labetalol or hydralazine if BP above these parameters. Avoid oral antihypertensives. Patient is on cardizem gtt for a fib w/ RVR, recommend as much permissive HTN as this allows - MRI brain wo contrast - MRA H&N - TTE completed last week showed no clot, order add-on bubble - BLE [place] neg for DVT - Check A1c and LDL + add statin per guidelines - Anticoag, antiplatelets on hold 2/2 severe blood loss anemia requiring transfusion today and yesterday - q4 hr neuro checks - STAT head CT for any change in neuro exam - Tele - PT/OT/SLP - Stroke education - Amb referral to neurology upon discharge - Will continue to follow ______________________________________________________________________   Thank you for the opportunity to take part in the care of this patient. If you have any further questions, please contact the neurology consultation attending.  Signed,  Su Monks, MD [facility] [phone]  If 7pm- 7am, please page neurology on call as listed in [place].  **Any copied and pasted documentation  in this note was written by me in another application not billed for and pasted by me into this document.

## 2022-08-09 NOTE — Consult Note (Addendum)
Gastroenterology Consultation  Referring Provider:     Dr [place] Pink Admit date 1/245/24 Consult date       08/09/22  Reason for Consultation:     anemia         HPI:   Russell Byrd is a 77 y.o. male medical history significant for PAF, dementia, hypertension, dyslipidemia and urolithiasis, who was admitted with altered mental status- do note he was down on the floor x 3d at home after the fall prior to receiving help Found to be septic (UTI) with rhabdomyolsis in rapid afib and noted so have NSTEMI. He has been treated with fluids, antibiotics, heparin and antiarrythmics. He was to undergo cardiac cath however developed acute delirium and is not felt to have new CVA today. He was found to have covid last Saturday. Gi has been consulted for hemoglobin drop from 13+ on presentation to 8.3 to 6.8. There have been no overt signs of GIB through this time (no melena/hematochezia, hematemesis). He has been started on pantoprazole while here and appears to have been taking prevacid '30mg'$  daily at home. 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 No acute localizing process in the abdomen or pelvis. 2. Stable partially calcified mesenteric masses. 3. Stable left adrenal nodule. 4. Colonic diverticulosis. 5. Prostate enlargement This exam was compared to PET from 2022 and CT a/p from 7/23- evidently this mass dates back to 2015 [facility]) Patient is currently nonverbal and somewhat obtunded- information is gained from family, chart, and nurse. Daughter reports he takes prevacid and home as he had problems with another ppi but  she is unable to remember the name. They state he has no chronic gi problems other than the net for which he follows with [facility] and has been stable. They report he has never had a colonoscopy and has declined this repeatedly. He has had not signs of GIB and no abdominal pain. He has had no witnessed hemeatmesis,  melena/hematochezia by nursing staff. He is receiving prbc's presently.  PREVIOUS ENDOSCOPIES:            none  Past Medical History:  Diagnosis Date   Diabetes mellitus without complication ([place])    History of kidney stones    Hyperlipidemia    Hypertension     Past Surgical History:  Procedure Laterality Date   KNEE SURGERY Bilateral    arthroscopic    Family History  Problem Relation Age of Onset   Diabetes Father    Heart disease Father    Diabetes Mother      Social History   Tobacco Use   Smoking status: Never   Smokeless tobacco: Current    Types: Chew  Vaping Use   Vaping Use: Never used  Substance Use Topics   Alcohol use: Not Currently   Drug use: No    Prior to Admission medications   Medication Sig Start Date End Date Taking? Authorizing Provider  Acetaminophen (TYLENOL 8 HOUR PO) Take by mouth daily.   Yes [provider]  atenolol (TENORMIN) 25 MG tablet Take 1 tablet (25 mg total) by mouth 2 (two) times daily. 06/24/22  Yes Johnson, Megan P, DO  atorvastatin (LIPITOR) 20 MG tablet Take 1 tablet (20 mg total) by mouth daily. 06/24/22  Yes Johnson, Megan P, DO  benazepril (LOTENSIN) 40 MG tablet Take 1 tablet (40 mg total) by mouth daily. 06/24/22  Yes Johnson, Megan P, DO  lansoprazole (PREVACID) 30 MG capsule  Take 1 capsule (30 mg total) by mouth daily at 12 noon. 06/24/22  Yes Johnson, Megan P, DO  metFORMIN (GLUCOPHAGE) 500 MG tablet Take 2 tablets (1,000 mg total) by mouth 2 (two) times daily with a meal. 06/24/22  Yes Johnson, Megan P, DO  Misc Natural Products (OSTEO BI-FLEX/5-LOXIN ADVANCED PO) Take by mouth.   Yes [provider]  Multiple Vitamins-Minerals (CENTRUM SILVER ADULT 50+) TABS Take by mouth daily.   Yes [provider]  vitamin E 400 UNIT capsule Take 400 Units by mouth daily.   Yes [provider]  Zinc 50 MG CAPS Take 50 mg by mouth daily.   Yes [provider]  blood glucose meter kit  and supplies KIT Dispense based on patient and insurance preference; One touch. Use up to three times daily as directed. (FOR ICD-9 250.00, 250.01). 04/17/20   Johnson, Megan P, DO  hydrALAZINE (APRESOLINE) 10 MG tablet Take 0.5 tablets (5 mg total) by mouth 3 (three) times daily. Patient not taking: Reported on 07/14/2022 06/27/22   Park Liter P, DO  Ibuprofen 200 MG CAPS Take by mouth.    [provider]  OneTouch Delica Lancets 52D MISC Apply topically. 05/10/20   [provider]  ONETOUCH ULTRA test strip 1 each 2 (two) times daily. 05/10/20   [provider]    Current Facility-Administered Medications  Medication Dose Route Frequency Provider Last Rate Last Admin   0.9 %  sodium chloride infusion (Manually program via Guardrails IV Fluids)   Intravenous Once Sharion Settler, NP       0.9% sodium chloride infusion  1 mL/kg/hr Intravenous Continuous Gerrie Nordmann, NP   Stopped at 08/02/22 0733   acetaminophen (TYLENOL) tablet 1,000 mg  1,000 mg Oral Q6H PRN Lucienne Minks, MD   1,000 mg at 08/07/22 2037   Or   acetaminophen (TYLENOL) suppository 650 mg  650 mg Rectal Q6H PRN Lucienne Minks, MD   650 mg at 07/30/2022 0426   aspirin EC tablet 81 mg  81 mg Oral Daily Mansy, Jan A, MD   81 mg at 08/07/22 7824   Chlorhexidine Gluconate Cloth 2 % PADS 6 each  6 each Topical Daily Annita Brod, MD       guaiFENesin (ROBITUSSIN) 100 MG/5ML liquid 5 mL  5 mL Oral Q4H PRN Lucienne Minks, MD   5 mL at 08/04/22 2130   insulin aspart (novoLOG) injection 0-5 Units  0-5 Units Subcutaneous QHS Lucienne Minks, MD   2 Units at 08/08/22 2119   insulin aspart (novoLOG) injection 0-9 Units  0-9 Units Subcutaneous TID WC Lucienne Minks, MD   2 Units at 08/09/22 1013   isosorbide dinitrate (ISORDIL) tablet 20 mg  20 mg Oral TID Lucienne Minks, MD   20 mg at 08/07/22 2037   magnesium hydroxide (MILK OF MAGNESIA) suspension 30 mL  30 mL Oral Daily PRN Mansy, Jan A, MD        menthol-cetylpyridinium (CEPACOL) lozenge 3 mg  1 lozenge Oral PRN Lucienne Minks, MD   3 mg at 08/04/22 1658   metoprolol tartrate (LOPRESSOR) tablet 25 mg  25 mg Oral BID Minna Merritts, MD   25 mg at 08/07/22 2037   morphine (PF) 2 MG/ML injection 1 mg  1 mg Intravenous Q3H PRN Annita Brod, MD   1 mg at 08/09/22 1014   ondansetron (ZOFRAN) tablet 4 mg  4 mg Oral Q6H PRN Mansy, Arvella Merles, MD  Or   ondansetron (ZOFRAN) injection 4 mg  4 mg Intravenous Q6H PRN Mansy, Jan A, MD       pantoprazole (PROTONIX) EC tablet 20 mg  20 mg Oral Daily Minna Merritts, MD   20 mg at 08/07/22 6063   traZODone (DESYREL) tablet 25 mg  25 mg Oral QHS Annita Brod, MD        Allergies as of 07/13/2022 - Review Complete 08/02/2022  Allergen Reaction Noted   Amlodipine Swelling 05/27/2022   Flomax [tamsulosin] Swelling 02/04/2022   Hydrochlorothiazide Other (See Comments) 05/27/2022   Prednisone Other (See Comments) 03/28/2015   Tetracycline  03/28/2015   Tetracyclines & related  03/28/2015   Tramadol Other (See Comments) 09/11/2018     Review of Systems:    All systems reviewed and negative except where noted in HPI with exception of sob and weakness    Physical Exam:  Vital signs in last 24 hours: Temp:  [98 F (36.7 C)-100.2 F (37.9 C)] 98.1 F (36.7 C) (02/02 1102) Pulse Rate:  [68-92] 80 (02/02 1112) Resp:  [16-34] 24 (02/02 1112) BP: (104-175)/(45-153) 112/61 (02/02 1112) SpO2:  [88 %-100 %] 97 % (02/02 1112) Last BM Date : 08/08/22 General:   Ill appearing elderly man in some mild distress Head:  Normocephalic and atraumatic. Eyes:   No icterus.   Conjunctiva pale pink. Ears:  Normal auditory acuity. Mouth: Mucosa pink moist, no lesions.lips dry Neck:  Supple; no masses felt Lungs:  Respirations somewhat labored and tachypneic at times.. Lungs decreased  to auscultation bilaterally.   No wheezes, crackles, or rhonchi.  Heart:  S1S2, irregular rythm, tachycardic. no  MRG. Generalized mild edema. Rapid af on monitor- dilitazem drip has been started. Abdomen:   Flat, soft, nondistended, nontender. Normal bowel sounds. No appreciable masses or hepatomegaly. No rebound signs or other peritoneal signs. Rectal:  Not performed.  Msk:  MAEW weakly No clubbing or cyanosis. Strength 3/5. Marland Kitchen Neurologic: Opens eyes to touch and spontaneously but does not respond to questions nor make eye contact;  not following commands at this time. Has some agitation and spontaneous movement of upper extremities.  Skin:  Warm, dry, pale pink without significant lesions or rashes. Psych: Flat affect.   LAB RESULTS: Recent Labs    08/07/22 0146 08/08/22 0622 08/09/22 0257  WBC 8.5 13.0* 14.9*  HGB 8.3* 6.8* 6.5*  HCT 24.5* 20.4* 19.5*  PLT 209 259 274   BMET Recent Labs    08/07/22 0146 08/08/22 0622 08/09/22 0257  NA 134* 135 139  K 4.3 4.8 4.5  CL 103 104 107  CO2 27 20* 21*  GLUCOSE 149* 222* 181*  BUN 25* 36* 51*  CREATININE 1.23 1.50* 2.21*  CALCIUM 7.8* 8.1* 7.9*   LFT No results for input(s): "PROT", "ALBUMIN", "AST", "ALT", "ALKPHOS", "BILITOT", "BILIDIR", "IBILI" in the last 72 hours. PT/INR No results for input(s): "LABPROT", "INR" in the last 72 hours.  STUDIES: CT HEAD WO CONTRAST (5MM)  Result Date: 08/09/2022 CLINICAL DATA:  Altered mental status, facial droop. EXAM: CT HEAD WITHOUT CONTRAST TECHNIQUE: Contiguous axial images were obtained from the base of the skull through the vertex without intravenous contrast. RADIATION DOSE REDUCTION: This exam was performed according to the departmental dose-optimization program which includes automated exposure control, adjustment of the mA and/or kV according to patient size and/or use of iterative reconstruction technique. COMPARISON:  Head CT dated 08/06/2022. FINDINGS: Brain: New area of low-density within the RIGHT lower occipital-temporal lobe. Associated  sulcal effacement. No associated midline shift or  herniation. Additional new area of low-density within the lower RIGHT frontal lobe. Chronic small vessel ischemic changes within the bilateral periventricular white matter regions. No parenchymal or extra-axial hemorrhage. Vascular: Chronic calcified atherosclerotic changes of the large vessels at the skull base. No unexpected hyperdense vessel. Skull: Normal. Negative for fracture or focal lesion. Sinuses/Orbits: No acute finding. Other: None. IMPRESSION: 1. New areas of low-density within the lower RIGHT occipital-temporal lobe and lower RIGHT frontal lobe, consistent with edema, and consistent with acute-to-subacute infarcts. Given the multifocality, findings are suspicious for emboli. Recommend brain MRI for further characterization. 2. No intracranial hemorrhage. 3. Chronic small vessel ischemic changes within the bilateral periventricular white matter regions. Critical Value/emergent results were called by telephone at the time of interpretation on 08/09/2022 at 10:12 am to provider [facility] , who verbally acknowledged these results. Electronically Signed   By: Franki Cabot M.D.   On: 08/09/2022 10:12       Impression / Plan:   Acute anemia- no signs of GIB- this is likely multifactorial given patient's grave clinical condition and we will follow distantly. Would continue ppi but no plans for any sedated procedures at present.  Agree with care and neuro consult- if obvious signs of GIB present would reonsider based on clinical presentation  Thank you very much for this consult. These services were provided by Stephens November, NP-C, in collaboration with Ronne Binning, DO, with whom I have discussed this patient in full.   Stephens November, NP-C

## 2022-08-09 NOTE — Inpatient Diabetes Management (Signed)
Inpatient Diabetes Program Recommendations  AACE/ADA: New Consensus Statement on Inpatient Glycemic Control   Target Ranges:  Prepandial:   less than 140 mg/dL      Peak postprandial:   less than 180 mg/dL (1-2 hours)      Critically ill patients:  140 - 180 mg/dL    Latest Reference Range & Units 08/08/22 08:18 08/08/22 12:03 08/08/22 16:56 08/08/22 20:32 08/09/22 07:42  Glucose-Capillary 70 - 99 mg/dL 206 (H) 230 (H) 196 (H) 202 (H) 186 (H)   Review of Glycemic Control  Current orders for Inpatient glycemic control: Novolog 0-9 units TID with meals, Novolog 0-5 units QHS   Inpatient Diabetes Program Recommendations:     Insulin: Please consider ordering Novolog 2 units TID with meals for meal coverage if patient eats at least 50% of meals.   Thanks, Barnie Alderman, RN, MSN, [place] Diabetes Coordinator Inpatient Diabetes Program [phone] (Team Pager from 8am to [place])

## 2022-08-09 NOTE — TOC Progression Note (Signed)
Transition of Care [facility]) - Progression Note    Patient Details  Name: Russell Byrd MRN: [id number] Date of Birth: [birth year]  [facility] CM/SW Contact  Laurena Slimmer, RN Phone Number: 08/09/2022, 9:21 AM  Clinical Narrative:    Retrieved call from patient's daughter Margarita Grizzle. She request patient be transferred today to either [facility] or [place]. She would like to speak with MD. MD notified.         Expected Discharge Plan and Services                                               Social Determinants of Health (SDOH) Interventions SDOH Screenings   Depression (PHQ2-9): Low Risk  (05/27/2022)  Tobacco Use: High Risk (06/24/2022)    Readmission Risk Interventions     No data to display

## 2022-08-09 NOTE — Progress Notes (Signed)
Just noted Hgb dropped from 6.8 to 6.5 after one unit of blood transfusion. I just noted he has another unit to be released and after releasing it the blood bank said the 2nd blood is not authorized by the ordered provider to be given and needs to be reordered. Notified Randol Kern and received another unit.

## 2022-08-09 NOTE — Progress Notes (Addendum)
Initial Nutrition Assessment  DOCUMENTATION CODES:   Not applicable  INTERVENTION:   -Magic cup TID with meals, each supplement provides 290 kcal and 9 grams of protein  -Downgrade diet to dysphagia 3 for ease of intake -Mighty Shake TID with meals, each supplement provides 220 kcals and 6 grams protein -MVI with minerals daily -500 mg vitamin C BID -220 mg zinc sulfate daily x 14 days -If pt remains encephalopathic unable to take adequate po's, consider initiation of nutrition support if this aligns with pt's goals of care:   Initiate Osmolite 1.5 @ 20 ml/hr and increase by 10 ml every 4 hours to goal rate of 50 ml/hr.   60 ml Prosource TF daily  150 ml free water flush every 4 hours   Tube feeding regimen provides 1880 kcal (100% of needs), 95 grams of protein, and 914 ml of H2O. Total free water: 1814 ml daily  NUTRITION DIAGNOSIS:   Inadequate oral intake related to lethargy/confusion as evidenced by per patient/family report.  GOAL:   Patient will meet greater than or equal to 90% of their needs  MONITOR:   PO intake, Supplement acceptance  REASON FOR ASSESSMENT:   Low Braden    ASSESSMENT:   Pt with past medical history of paroxysmal atrial fibrillation, diabetes mellitus who presents with confusion and fall and had been on the floor for 3 days prior to being found.  Pt admitted with sepsis secondary to UTI.   Reviewed I/O's: +7.5 ml x 24 hours and +2.3 L since admission  UOP: 350 ml x 24 hours   Pt lying in bed at time of visit.   RD brought meal tray into pt room. History obtained from 2 daughters at bedside, who reports pt was eating normally PTA. Over the hospital admission, pt was making progress, eating and able to feed himself. Noted meal completions 50%. Per daughter, pt has been encephalopathic for 2 days with no signs of improvement from their stand point. They also noted pt with lt facial droop and are concerned about possible stroke. They  expressed concern about this. Emotional support provided and concerns communicated to medical team.  Reviewed wt hx; pt has experienced a 4.6% wt loss over the past 3 months, which is not significant for time frame.   Medications reviewed and include cardizem.   Lab Results  Component Value Date   HGBA1C 7.3 (H) 08/01/2022   PTA DM medications are 1000 mg metformin BID.   Labs reviewed: CBGS: 179-183 (inpatient orders for glycemic control are 0-5 units insulin aspart daily at bedtime and 0-9 units insulin aspart TID with meals).    NUTRITION - FOCUSED PHYSICAL EXAM:  Flowsheet Row Most Recent Value  Orbital Region No depletion  Upper Arm Region No depletion  Thoracic and Lumbar Region No depletion  Buccal Region No depletion  Temple Region No depletion  Clavicle Bone Region No depletion  Clavicle and Acromion Bone Region No depletion  Scapular Bone Region No depletion  Dorsal Hand No depletion  Patellar Region No depletion  Anterior Thigh Region No depletion  Posterior Calf Region No depletion  Edema (RD Assessment) Mild  Hair Reviewed  Eyes Reviewed  Mouth Reviewed  Skin Reviewed  Nails Reviewed       Diet Order:   Diet Order             Diet heart healthy/carb modified Room service appropriate? No; Fluid consistency: Thin  Diet effective now  EDUCATION NEEDS:   No education needs have been identified at this time  Skin:  Skin Assessment: Skin Integrity Issues: Skin Integrity Issues:: Stage II Stage II: coccyx, lt thigh  Last BM:  08/08/22 (type 6)  Height:   Ht Readings from Last 1 Encounters:  08/01/22 '5\' 7"'$  (1.702 m)    Weight:   Wt Readings from Last 1 Encounters:  08/01/22 74.7 kg    Ideal Body Weight:  67.3 kg  BMI:  Body mass index is 25.79 kg/m.  Estimated Nutritional Needs:   Kcal:  1800-2000  Protein:  90-105 grams  Fluid:  > 1.8 L    Loistine Chance, RD, LDN, [place] Registered Dietitian II Certified Diabetes  Care and Education Specialist Please refer to [facility] for RD and/or RD on-call/weekend/after hours pager

## 2022-08-09 NOTE — Progress Notes (Signed)
Rounding Note    Patient Name: Russell Byrd Date of Encounter: 08/09/2022  [facility] Cardiologist: New to [facility]  Subjective   Somnolent this morning as with yesterday Daughter at the bedside, Continued moaning, contractures of arms Will not open eyes, nonverbal Unable to exclude facial droop on left Unable to participate with PT/OT Lab work reviewed, drop in hemoglobin from the 9 range down to 6.8 yesterday, 6.5 today, WBC trending upwards 14.9  Inpatient Medications    Scheduled Meds:  sodium chloride   Intravenous Once   aspirin EC  81 mg Oral Daily   Chlorhexidine Gluconate Cloth  6 each Topical Daily   insulin aspart  0-5 Units Subcutaneous QHS   insulin aspart  0-9 Units Subcutaneous TID WC   isosorbide dinitrate  20 mg Oral TID   metoprolol tartrate       pantoprazole  20 mg Oral Daily   traZODone  25 mg Oral QHS   Continuous Infusions:  sodium chloride Stopped (08/02/22 0733)   diltiazem (CARDIZEM) infusion 5 mg/hr (08/09/22 1239)   PRN Meds: acetaminophen **OR** acetaminophen, guaiFENesin, magnesium hydroxide, menthol-cetylpyridinium, metoprolol tartrate, metoprolol tartrate, morphine injection, ondansetron **OR** ondansetron (ZOFRAN) IV   Vital Signs    Vitals:   08/09/22 1102 08/09/22 1106 08/09/22 1112 08/09/22 1148  BP: (!) 155/66 (!) 160/57 112/61 (!) 156/123  Pulse: 91  80   Resp: 16  (!) 24 (!) 24  Temp: 98.1 F (36.7 C)   98.4 F (36.9 C)  TempSrc: Axillary   Axillary  SpO2:   97% 97%  Weight:      Height:        Intake/Output Summary (Last 24 hours) at 08/09/2022 1240 Last data filed at 08/09/2022 0200 Gross per 24 hour  Intake 357.5 ml  Output 350 ml  Net 7.5 ml      08/01/2022    9:15 PM 07/11/2022    8:00 PM 06/24/2022    3:51 PM  Last 3 Weights  Weight (lbs) 164 lb 10.9 oz 175 lb 11.3 oz 175 lb 9.6 oz  Weight (kg) 74.7 kg 79.7 kg 79.652 kg      Telemetry    Normal sinus rhythm rate in the 80s-90- Personally  Reviewed  ECG     - Personally Reviewed  Physical Exam   GEN: Laying supine in bed, eyes closed, minimally responsive Neck: No JVD Cardiac: RRR, no murmurs, rubs, or gallops.  Respiratory: Clear to auscultation bilaterally. GI: Soft,  non-distended  MS: No edema; No deformity. Neuro: Lethargic Psych: Somnolent  Labs    High Sensitivity Troponin:   Recent Labs  Lab 07/09/2022 1747 08/03/2022 1924 08/01/22 0545 08/01/22 0802  TROPONINIHS 1,589* 2,131* 2,602* 2,867*     Chemistry Recent Labs  Lab 08/04/22 0535 07/10/2022 0430 08/06/22 0355 08/07/22 0146 08/08/22 0622 08/09/22 0257  NA 134* 135 134* 134* 135 139  K 3.3* 4.4 4.1 4.3 4.8 4.5  CL 109 106 105 103 104 107  CO2 21* 21* 23 27 20* 21*  GLUCOSE 191* 178* 153* 149* 222* 181*  BUN 24* 25* 22 25* 36* 51*  CREATININE 1.23 1.11 1.18 1.23 1.50* 2.21*  CALCIUM 7.6* 7.9* 7.5* 7.8* 8.1* 7.9*  MG 1.5* 2.0 1.9  --   --   --   PROT 5.9* 5.9* 5.6*  --   --   --   ALBUMIN 2.5* 2.5* 2.3*  --   --   --   AST 40 64* 103*  --   --   --  ALT 35 52* 82*  --   --   --   ALKPHOS 49 47 50  --   --   --   BILITOT 0.6 0.6 0.6  --   --   --   GFRNONAA >60 >60 >60 >60 48* 30*  ANIONGAP 4* 8 6 4* 11 11    Lipids No results for input(s): "CHOL", "TRIG", "HDL", "LABVLDL", "LDLCALC", "CHOLHDL" in the last 168 hours.  Hematology Recent Labs  Lab 08/07/22 0146 08/08/22 0622 08/09/22 0257  WBC 8.5 13.0* 14.9*  RBC 2.83* 2.35* 2.19*  HGB 8.3* 6.8* 6.5*  HCT 24.5* 20.4* 19.5*  MCV 86.6 86.8 89.0  MCH 29.3 28.9 29.7  MCHC 33.9 33.3 33.3  RDW 12.0 12.3 12.9  PLT 209 259 274   Thyroid No results for input(s): "TSH", "FREET4" in the last 168 hours.  BNP Recent Labs  Lab 08/09/22 0257  BNP 278.2*    DDimer No results for input(s): "DDIMER" in the last 168 hours.   Radiology    CT HEAD WO CONTRAST (5MM)  Result Date: 08/09/2022 CLINICAL DATA:  Altered mental status, facial droop. EXAM: CT HEAD WITHOUT CONTRAST TECHNIQUE:  Contiguous axial images were obtained from the base of the skull through the vertex without intravenous contrast. RADIATION DOSE REDUCTION: This exam was performed according to the departmental dose-optimization program which includes automated exposure control, adjustment of the mA and/or kV according to patient size and/or use of iterative reconstruction technique. COMPARISON:  Head CT dated 08/02/2022. FINDINGS: Brain: New area of low-density within the RIGHT lower occipital-temporal lobe. Associated sulcal effacement. No associated midline shift or herniation. Additional new area of low-density within the lower RIGHT frontal lobe. Chronic small vessel ischemic changes within the bilateral periventricular white matter regions. No parenchymal or extra-axial hemorrhage. Vascular: Chronic calcified atherosclerotic changes of the large vessels at the skull base. No unexpected hyperdense vessel. Skull: Normal. Negative for fracture or focal lesion. Sinuses/Orbits: No acute finding. Other: None. IMPRESSION: 1. New areas of low-density within the lower RIGHT occipital-temporal lobe and lower RIGHT frontal lobe, consistent with edema, and consistent with acute-to-subacute infarcts. Given the multifocality, findings are suspicious for emboli. Recommend brain MRI for further characterization. 2. No intracranial hemorrhage. 3. Chronic small vessel ischemic changes within the bilateral periventricular white matter regions. Critical Value/emergent results were called by telephone at the time of interpretation on 08/09/2022 at 10:12 am to provider [facility] , who verbally acknowledged these results. Electronically Signed   By: Franki Cabot M.D.   On: 08/09/2022 10:12    Cardiac Studies   TTE 08/01/22 1. Left ventricular ejection fraction, by estimation, is 55 to 60%. The  left ventricle has normal function. The left ventricle has no regional  wall motion abnormalities. There is mild left ventricular hypertrophy.   Left ventricular diastolic parameters  are indeterminate.   2. Right ventricular systolic function is normal. The right ventricular  size is normal. There is normal pulmonary artery systolic pressure.   3. The mitral valve is normal in structure. Mild to moderate mitral valve  regurgitation. No evidence of mitral stenosis.   4. The aortic valve is normal in structure. Aortic valve regurgitation is  mild. No aortic stenosis is present.   5. The inferior vena cava is normal in size with greater than 50%  respiratory variability, suggesting right atrial pressure of 3 mmHg.   Patient Profile     77 y.o. male with a history of gastroesophageal reflux disease, hypertension, hyperlipidemia, gait  instability, type 2 diabetes, who is being seen and evaluated for atrial fibrillation with RVR and NSTEMI.   Assessment & Plan    Non-STEMI After being found down, rapid atrial fibrillation, urinary tract infection, rhabdo Troponin 2600 Risk factors include hyperlipidemia, diabetes EKG concerning for anterolateral ischemia with ST depressions, unable to exclude old anterior MI Echocardiogram with EF 55 to 60% with no focal wall motion abnormality Catheterization initially planned for last week but  delayed given agitation, delirium, pulling on his IVs, Haldol given ,  in mitts Diagnosed with COVID, continued confusion Plan was for catheterization possibly next week acute mental status changes yesterday, worsening anemia as delayed procedure -Significant improvement in mentation, still not a good candidate for cardiac catheterization, continued anemia, mental status changes  2) encephalopathy Acute onset yesterday and by his bedside, for infection negative Left facial droop notable today, CT scan head concerning for stroke Not a good candidate for heparin given anemia  3)atrial fibrillation with RVR On the ground for 3 days, UTI, presenting with A-fib rate 150 bpm converted to normal sinus rhythm  shortly after admission,  normal sinus rhythm has maintained over the past 2 weeks -Initially managed with heparin fusion, heparin  held in the setting of worsening anemia concerning for blood loss  continue metoprolol tartrate 25 twice daily NOAC on hold this admission given anemia, discussion for cardiac catheterization earlier in the week   3.  Urinary tract infection Treated with broad-spectrum antibiotics, blood pressure stable Climbing creatinine 2.2, up from 1.5 up from 1.2 yesterday Oral intake may be poor,  Started on IV fluids   4.  Rhabdomyolysis On the ground for 3 days at home, soreness in bilateral shoulders on arrival Notes indicating gait instability at baseline Given mental status changes yesterday and today, unable to work with PT OT Infection workup negative   5. GERD  home PPI   Case discussed with hospitalist service  Total encounter time more than 50 minutes  Greater than 50% was spent in counseling and coordination of care with the patient    For questions or updates, please contact [place] Please consult [email or web address] for contact info under        Signed, Ida Rogue, MD  08/09/2022, 12:40 PM

## 2022-08-09 NOTE — Progress Notes (Signed)
OT Cancellation Note  Patient Details Name: Russell Byrd MRN: [id number] DOB: Dec 05, 1945   Cancelled Treatment:    Reason Eval/Treat Not Completed: Other (comment) (Multiple medical issues with transfer to ICU. Please re consult when pt is medically approrpiate to participate in OT.)  Shanon Payor, OTD OTR/L  08/09/22, 2:55 PM

## 2022-08-09 NOTE — Progress Notes (Addendum)
1930 patient responds to pain only moaning unable to make any needs known daughter at bedside MRI called will take patient down for MRI asap. Made patient NPO doesn't follow commands unsafe to have oral intake  2220 MRI completed patient restless PRN ativan not effective 2315 Cardizem off at this time for HR WNL BP had decreased as patient went to sleep.

## 2022-08-09 NOTE — Progress Notes (Signed)
Interim neurology note  Neurology was consulted on this 77 yo gentleman admitted for one week now with acute ischemic infarcts. He initially presented with sepsis 2/2 UTI (treated), new a fib, and NSTEMI. He was on heparin until yesterday (cath put on hold after he tested covid (+)). The night before last he became confused. His hgb started to drop and heparin was placed on hold yesterday AM. Over the course of the day yesterday he worsening AMS and then today L facial droop therefore team ordered head CT which showed acute infarcts with edema in the R occipital and R frontal lobes. He required blood today and yesterday and source of bleeding has not been identified. He remains altered but protecting his airway.  - Permissive HTN x48 hrs from sx onset or until stroke ruled out by MRI goal BP <220/110. PRN labetalol or hydralazine if BP above these parameters. Avoid oral antihypertensives. Patient is on cardizem gtt for a fib w/ RVR, recommend as much permissive HTN as this allows - MRI brain wo contrast - MRA H&N - TTE completed last week showed no clot, order add-on bubble - BLE [place] neg for DVT - Check A1c and LDL + add statin per guidelines - Anticoag, antiplatelets on hold 2/2 severe blood loss anemia requiring transfusion today and yesterday - q4 hr neuro checks - STAT head CT for any change in neuro exam - Tele - PT/OT/SLP - Stroke education - Amb referral to neurology upon discharge - Full consult note to follow  Su Monks, MD [facility] [phone]  If 7pm- 7am, please page neurology on call as listed in [place].

## 2022-08-09 NOTE — Progress Notes (Signed)
PT Cancellation Note  Patient Details Name: Russell Byrd MRN: [id number] DOB: 01-17-1946   Cancelled Treatment:    Reason Eval/Treat Not Completed: Medical issues which prohibited therapy (Multiple medical issues ongoing with transfer to ICU. Discussed with attending MD via secure chat, PT to sign off at this time. Please re-consult PT when appropriate.)  Minna Merritts, PT, MPT  Percell Locus 08/09/2022, 2:46 PM

## 2022-08-09 NOTE — Plan of Care (Signed)
Problem: Fluid Volume: Goal: Hemodynamic stability will improve Outcome: Not Progressing   Problem: Clinical Measurements: Goal: Diagnostic test results will improve Outcome: Not Progressing Goal: Signs and symptoms of infection will decrease Outcome: Not Progressing   Problem: Respiratory: Goal: Ability to maintain adequate ventilation will improve Outcome: Not Progressing   Problem: Education: Goal: Understanding of CV disease, CV risk reduction, and recovery process will improve Outcome: Not Progressing Goal: Individualized Educational Video(s) Outcome: Not Progressing   Problem: Activity: Goal: Ability to return to baseline activity level will improve Outcome: Not Progressing   Problem: Cardiovascular: Goal: Ability to achieve and maintain adequate cardiovascular perfusion will improve Outcome: Not Progressing Goal: Vascular access site(s) Level 0-1 will be maintained Outcome: Not Progressing   Problem: Health Behavior/Discharge Planning: Goal: Ability to safely manage health-related needs after discharge will improve Outcome: Not Progressing   Problem: Education: Goal: Ability to describe self-care measures that may prevent or decrease complications (Diabetes Survival Skills Education) will improve Outcome: Not Progressing Goal: Individualized Educational Video(s) Outcome: Not Progressing   Problem: Coping: Goal: Ability to adjust to condition or change in health will improve Outcome: Not Progressing   Problem: Fluid Volume: Goal: Ability to maintain a balanced intake and output will improve Outcome: Not Progressing   Problem: Health Behavior/Discharge Planning: Goal: Ability to identify and utilize available resources and services will improve Outcome: Not Progressing Goal: Ability to manage health-related needs will improve Outcome: Not Progressing   Problem: Metabolic: Goal: Ability to maintain appropriate glucose levels will improve Outcome: Not  Progressing   Problem: Nutritional: Goal: Maintenance of adequate nutrition will improve Outcome: Not Progressing Goal: Progress toward achieving an optimal weight will improve Outcome: Not Progressing   Problem: Skin Integrity: Goal: Risk for impaired skin integrity will decrease Outcome: Not Progressing   Problem: Tissue Perfusion: Goal: Adequacy of tissue perfusion will improve Outcome: Not Progressing   Problem: Education: Goal: Knowledge of General Education information will improve Description: Including pain rating scale, medication(s)/side effects and non-pharmacologic comfort measures Outcome: Not Progressing   Problem: Health Behavior/Discharge Planning: Goal: Ability to manage health-related needs will improve Outcome: Not Progressing   Problem: Clinical Measurements: Goal: Ability to maintain clinical measurements within normal limits will improve Outcome: Not Progressing Goal: Will remain free from infection Outcome: Not Progressing Goal: Diagnostic test results will improve Outcome: Not Progressing Goal: Respiratory complications will improve Outcome: Not Progressing Goal: Cardiovascular complication will be avoided Outcome: Not Progressing   Problem: Activity: Goal: Risk for activity intolerance will decrease Outcome: Not Progressing   Problem: Nutrition: Goal: Adequate nutrition will be maintained Outcome: Not Progressing   Problem: Coping: Goal: Level of anxiety will decrease Outcome: Not Progressing   Problem: Elimination: Goal: Will not experience complications related to bowel motility Outcome: Not Progressing Goal: Will not experience complications related to urinary retention Outcome: Not Progressing   Problem: Safety: Goal: Ability to remain free from injury will improve Outcome: Not Progressing   Problem: Skin Integrity: Goal: Risk for impaired skin integrity will decrease Outcome: Not Progressing   Problem: Education: Goal:  Knowledge of disease or condition will improve Outcome: Not Progressing Goal: Knowledge of secondary prevention will improve (MUST DOCUMENT ALL) Outcome: Not Progressing Goal: Knowledge of patient specific risk factors will improve [place] N/A or DELETE if not current risk factor) Outcome: Not Progressing   Problem: Ischemic Stroke/TIA Tissue Perfusion: Goal: Complications of ischemic stroke/TIA will be minimized Outcome: Not Progressing   Problem: Coping: Goal: Will verbalize positive feelings about self Outcome: Not Progressing  Goal: Will identify appropriate support needs Outcome: Not Progressing   Problem: Health Behavior/Discharge Planning: Goal: Ability to manage health-related needs will improve Outcome: Not Progressing Goal: Goals will be collaboratively established with patient/family Outcome: Not Progressing   Problem: Self-Care: Goal: Ability to participate in self-care as condition permits will improve Outcome: Not Progressing Goal: Verbalization of feelings and concerns over difficulty with self-care will improve Outcome: Not Progressing Goal: Ability to communicate needs accurately will improve Outcome: Not Progressing Patient total care non-verbal unable to make any needs known doesn't follow commands

## 2022-08-10 ENCOUNTER — Inpatient Hospital Stay: Payer: PPO

## 2022-08-10 DIAGNOSIS — A419 Sepsis, unspecified organism: Secondary | ICD-10-CM | POA: Diagnosis not present

## 2022-08-10 DIAGNOSIS — R58 Hemorrhage, not elsewhere classified: Secondary | ICD-10-CM

## 2022-08-10 DIAGNOSIS — I214 Non-ST elevation (NSTEMI) myocardial infarction: Secondary | ICD-10-CM | POA: Diagnosis not present

## 2022-08-10 DIAGNOSIS — A4150 Gram-negative sepsis, unspecified: Secondary | ICD-10-CM | POA: Diagnosis not present

## 2022-08-10 DIAGNOSIS — T796XXA Traumatic ischemia of muscle, initial encounter: Secondary | ICD-10-CM | POA: Diagnosis not present

## 2022-08-10 DIAGNOSIS — I2489 Other forms of acute ischemic heart disease: Secondary | ICD-10-CM

## 2022-08-10 DIAGNOSIS — I480 Paroxysmal atrial fibrillation: Secondary | ICD-10-CM | POA: Diagnosis not present

## 2022-08-10 DIAGNOSIS — I639 Cerebral infarction, unspecified: Secondary | ICD-10-CM | POA: Diagnosis not present

## 2022-08-10 DIAGNOSIS — I4891 Unspecified atrial fibrillation: Secondary | ICD-10-CM | POA: Diagnosis not present

## 2022-08-10 LAB — TYPE AND SCREEN
ABO/RH(D): O NEG
Antibody Screen: NEGATIVE
Unit division: 0
Unit division: 0
Unit division: 0

## 2022-08-10 LAB — BASIC METABOLIC PANEL
Anion gap: 12 (ref 5–15)
BUN: 45 mg/dL — ABNORMAL HIGH (ref 8–23)
CO2: 17 mmol/L — ABNORMAL LOW (ref 22–32)
Calcium: 7.9 mg/dL — ABNORMAL LOW (ref 8.9–10.3)
Chloride: 114 mmol/L — ABNORMAL HIGH (ref 98–111)
Creatinine, Ser: 1.81 mg/dL — ABNORMAL HIGH (ref 0.61–1.24)
GFR, Estimated: 38 mL/min — ABNORMAL LOW (ref >60)
Glucose, Bld: 156 mg/dL — ABNORMAL HIGH (ref 70–99)
Potassium: 4.1 mmol/L (ref 3.5–5.1)
Sodium: 143 mmol/L (ref 135–145)

## 2022-08-10 LAB — PROCALCITONIN: Procalcitonin: <0.1 ng/mL

## 2022-08-10 LAB — CBC
HCT: 33.5 % — ABNORMAL LOW (ref 39.0–52.0)
Hemoglobin: 10.6 g/dL — ABNORMAL LOW (ref 13.0–17.0)
MCH: 30.5 pg (ref 26.0–34.0)
MCHC: 31.6 g/dL (ref 30.0–36.0)
MCV: 96.5 fL (ref 80.0–100.0)
Platelets: 235 10*3/uL (ref 150–400)
RBC: 3.47 MIL/uL — ABNORMAL LOW (ref 4.22–5.81)
RDW: 13.5 % (ref 11.5–15.5)
WBC: 13.8 10*3/uL — ABNORMAL HIGH (ref 4.0–10.5)
nRBC: 0.6 % — ABNORMAL HIGH (ref 0.0–0.2)

## 2022-08-10 LAB — BPAM RBC
Blood Product Expiration Date: 202403062359
Blood Product Expiration Date: 202403062359
Blood Product Expiration Date: 202403062359
ISSUE DATE / TIME: 202402011739
ISSUE DATE / TIME: 202402021037
ISSUE DATE / TIME: 202402021556
Unit Type and Rh: 5100
Unit Type and Rh: 5100
Unit Type and Rh: 5100

## 2022-08-10 LAB — LIPID PANEL
Cholesterol: 101 mg/dL (ref 0–200)
HDL: 22 mg/dL — ABNORMAL LOW (ref >40)
LDL Cholesterol: 46 mg/dL (ref 0–99)
Total CHOL/HDL Ratio: 4.6 RATIO
Triglycerides: 164 mg/dL — ABNORMAL HIGH (ref <150)
VLDL: 33 mg/dL (ref 0–40)

## 2022-08-10 LAB — GLUCOSE, CAPILLARY
Glucose-Capillary: 138 mg/dL — ABNORMAL HIGH (ref 70–99)
Glucose-Capillary: 142 mg/dL — ABNORMAL HIGH (ref 70–99)
Glucose-Capillary: 132 mg/dL — ABNORMAL HIGH (ref 70–99)
Glucose-Capillary: 114 mg/dL — ABNORMAL HIGH (ref 70–99)
Glucose-Capillary: 119 mg/dL — ABNORMAL HIGH (ref 70–99)

## 2022-08-10 LAB — TROPONIN I (HIGH SENSITIVITY)
Troponin I (High Sensitivity): 111 ng/L (ref <18)
Troponin I (High Sensitivity): 117 ng/L (ref <18)

## 2022-08-10 MED ORDER — FUROSEMIDE 10 MG/ML IJ SOLN
20.0000 mg | Freq: Once | INTRAMUSCULAR | Status: AC
  Administered 2022-08-10: 20 mg via INTRAVENOUS
  Filled 2022-08-10: qty 2

## 2022-08-10 MED ORDER — ATROPINE SULFATE 1 MG/10ML IJ SOSY
PREFILLED_SYRINGE | INTRAMUSCULAR | Status: AC
  Filled 2022-08-10: qty 10

## 2022-08-10 NOTE — Plan of Care (Signed)
Problem: Fluid Volume: Goal: Hemodynamic stability will improve Outcome: Not Progressing   Problem: Clinical Measurements: Goal: Diagnostic test results will improve Outcome: Not Progressing Goal: Signs and symptoms of infection will decrease Outcome: Not Progressing   Problem: Respiratory: Goal: Ability to maintain adequate ventilation will improve Outcome: Not Progressing   Problem: Education: Goal: Understanding of CV disease, CV risk reduction, and recovery process will improve Outcome: Not Progressing Goal: Individualized Educational Video(s) Outcome: Not Progressing   Problem: Activity: Goal: Ability to return to baseline activity level will improve Outcome: Not Progressing   Problem: Cardiovascular: Goal: Ability to achieve and maintain adequate cardiovascular perfusion will improve Outcome: Not Progressing Goal: Vascular access site(s) Level 0-1 will be maintained Outcome: Not Progressing   Problem: Education: Goal: Ability to describe self-care measures that may prevent or decrease complications (Diabetes Survival Skills Education) will improve Outcome: Not Progressing Goal: Individualized Educational Video(s) Outcome: Not Progressing   Problem: Coping: Goal: Ability to adjust to condition or change in health will improve Outcome: Not Progressing   Problem: Health Behavior/Discharge Planning: Goal: Ability to identify and utilize available resources and services will improve Outcome: Not Progressing Goal: Ability to manage health-related needs will improve Outcome: Not Progressing   Problem: Metabolic: Goal: Ability to maintain appropriate glucose levels will improve Outcome: Not Progressing   Problem: Nutritional: Goal: Maintenance of adequate nutrition will improve Outcome: Not Progressing Goal: Progress toward achieving an optimal weight will improve Outcome: Not Progressing   Problem: Skin Integrity: Goal: Risk for impaired skin integrity will  decrease Outcome: Not Progressing   Problem: Tissue Perfusion: Goal: Adequacy of tissue perfusion will improve Outcome: Not Progressing   Problem: Education: Goal: Knowledge of General Education information will improve Description: Including pain rating scale, medication(s)/side effects and non-pharmacologic comfort measures Outcome: Not Progressing   Problem: Health Behavior/Discharge Planning: Goal: Ability to manage health-related needs will improve Outcome: Not Progressing   Problem: Clinical Measurements: Goal: Ability to maintain clinical measurements within normal limits will improve Outcome: Not Progressing Goal: Will remain free from infection Outcome: Not Progressing Goal: Diagnostic test results will improve Outcome: Not Progressing Goal: Respiratory complications will improve Outcome: Not Progressing Goal: Cardiovascular complication will be avoided Outcome: Not Progressing   Problem: Activity: Goal: Risk for activity intolerance will decrease Outcome: Not Progressing   Problem: Nutrition: Goal: Adequate nutrition will be maintained Outcome: Not Progressing   Problem: Coping: Goal: Level of anxiety will decrease Outcome: Not Progressing   Problem: Elimination: Goal: Will not experience complications related to bowel motility Outcome: Not Progressing Goal: Will not experience complications related to urinary retention Outcome: Not Progressing   Problem: Pain Managment: Goal: General experience of comfort will improve Outcome: Not Progressing   Problem: Safety: Goal: Ability to remain free from injury will improve Outcome: Not Progressing   Problem: Skin Integrity: Goal: Risk for impaired skin integrity will decrease Outcome: Not Progressing   Problem: Education: Goal: Knowledge of disease or condition will improve Outcome: Not Progressing Goal: Knowledge of secondary prevention will improve (MUST DOCUMENT ALL) Outcome: Not Progressing Goal:  Knowledge of patient specific risk factors will improve [place] N/A or DELETE if not current risk factor) Outcome: Not Progressing   Problem: Ischemic Stroke/TIA Tissue Perfusion: Goal: Complications of ischemic stroke/TIA will be minimized Outcome: Not Progressing   Problem: Coping: Goal: Will verbalize positive feelings about self Outcome: Not Progressing Goal: Will identify appropriate support needs Outcome: Not Progressing   Problem: Health Behavior/Discharge Planning: Goal: Ability to manage health-related needs will improve Outcome:  Not Progressing Goal: Goals will be collaboratively established with patient/family Outcome: Not Progressing   Problem: Self-Care: Goal: Ability to participate in self-care as condition permits will improve Outcome: Not Progressing Goal: Verbalization of feelings and concerns over difficulty with self-care will improve Outcome: Not Progressing Goal: Ability to communicate needs accurately will improve Outcome: Not Progressing

## 2022-08-10 NOTE — Progress Notes (Signed)
Progress Note   Patient: Russell Byrd ZOX:[id number] DOB: June 14, 1946 DOA: 08/07/2022     10 DOS: the patient was seen and examined on 08/10/2022   Brief hospital course: 77 year old male with past medical history of paroxysmal atrial fibrillation, diabetes mellitus who is into the emergency room on 1/24 with confusion and fall and had been on the floor for 3 days prior to being found.  In the emergency room, patient found to have sepsis secondary to Enterobacter UTI as well as elevated.  Admitted to the hospitalist service and started on IV cefepime.  Troponins continue to trend upward and patient felt to have a non-STEMI.  Initial plan was for cardiac catheterization, but was delayed due to patient having some acute hospital delirium, likely in the setting of underlying dementia.  On 1/27, patient testing positive for COVID.  2/1, patient more confused, with decreased level of responsiveness.  Patient's hemoglobin down to 6.8 (was 8.3 the day prior) with creatinine at 1.5, previously normal.  White blood cell count elevated, but infection workup negative.  Patient ordered 1 unit packed red blood cells.  Heparin stopped at 8 AM.  2/2: Patient about the same, now with facial droop.  Stat CT scan of head done this morning noting 2 areas of low-density in right lower occipital temporal lobe and lower right frontal lobe concerning for acute/subacute infarcts.  In addition, despite 1 unit packed red blood cell transfusion on previous day, hemoglobin down to 6.5 and creatinine up further to 2.3.  Patient ordered 2 more units packed red blood cells.  By late morning, patient developed rapid atrial fibrillation and started on Cardizem drip and transferred to stepdown unit.  GI consulted, recommending conservative measures.  2/3: MRI done night prior confirms multiple areas of embolic CVA.  Neurology feels likely occurred secondary to atrial fibs despite heparin infusion.  CT of abdomen/pelvis 2/3 notes small  intramuscular hematoma within the left pelvic sidewall and small amount of right retroperitoneal hemorrhage with left retroperitoneal hematoma approximately 940 cc of blood.  Case discussed with vascular surgery who will formally consult, but at this time, given multiple other issues and the fact that he appears to have stabilized from bleeding standpoint, no immediate intervention planned.  Assessment and Plan: Acute embolic CVA: Multi embolic.  Neurology consulted.  Curiously, patient's delirium started in the early morning hours of 2/1, prior to heparin being discontinued.  It is unclear if CVAs occurred after heparin discontinued or during.  Lower extremity Dopplers negative for DVT.  Given retroperitoneal bleed, not a candidate for generalized anticoagulation.  Sepsis due to gram-negative UTI (HCC) with enterobacter aerogenes-resolved Treated with IV fluids and completed 5-day cefepime course.  Given increasing white blood cell count and acute delirium, rechecking   Rhabdomyolysis-resolved Treated with IV fluids   NSTEMI/Elevated troponin I level - IV heparin drip with anti-coagulation monitoring by pharmacy Catheterization planned.  Initially held due to patient's acute delirium which is much improved.  Then noted to have fever and workup revealed COVID.  Cardiac cath once mentation and hemoglobin stabilized.  Troponins mildly elevated, consistent with demand ischemia.   Paroxysmal atrial fibrillation with RVR (HCC) -Heparin discontinued due to concerns for bleeding. Started having much more rapid ventricular rate on 2/2 and placed on Cardizem drip plus scheduled IV Lopressor.  By morning of 2/3, had converted back to normal sinus rhythm and Cardizem and Lopressor were stopped.   Dyslipidemia - Atorvastatin stopped due to rising LFTs (08/06/2022)   Type 2 diabetes  mellitus without complications (HCC) - Novolog sliding scale tid and bedtime    Essential hypertension - Lopressor as above   - Isordil 20 mg PO tid  - Lisinopril held due to concerns for renal failure and hypotension  Retroperitoneal bleed Possibly from initial fall prior to hospitalization and then being started on heparin during hospitalization.  Status post 3 units packed red blood cells.  Hemoglobin this morning at 10.6.  Continue to monitor.  Vascular surgery consulted although recommending no immediate intervention due to other comorbidities   COVID 19 infection Stable, not requiring medication at this time.  Not hypoxic  Senile dementia with acute behavioral disturbance and acute cognitive delirium Initial episode secondary to COVID and hospital delirium and now with multi embolic CVA.   Will attempt to place NG tube for feeding options.  Overweight: Meets criteria BMI greater than 25   DVT prophylaxis: IV heparin drip as above  GI prophylaxis: Protonix 20 mg PO daily   Antibiotics: Complete 5-day course of cefepime  Consultants: -Cardiology -Gastroenterology -Neurology -Discussed with critical care Vascular surgery  Procedures: -Echocardiogram -Cardiac cath pending     Subjective: Patient remains confused, moans  Physical Exam: Vitals:   08/10/22 1100 08/10/22 1200 08/10/22 1300 08/10/22 1400  BP: 139/66 (!) 151/55 (!) 139/52 (!) 177/53  Pulse: 64 65 69 81  Resp: 20 (!) '26 18 16  '$ Temp:  98 F (36.7 C)    TempSrc:  Axillary    SpO2: 98% 97% 97% 97%  Weight:      Height:       HEENT: Normocephalic, atraumatic, mucous membranes are slightly dry, right facial droop. Cardiovascular: Irregular rhythm, rate controlled Pulmonary: Clear to auscultation bilaterally, poor inspiratory effort Abdominal:  Soft, nondistended, hypoactive bowel sounds Musculoskeletal:  No clubbing or cyanosis, trace pitting edema Skin: No skin breaks, tears or lesions Neurological:  \Limited exam due to compliance.  Facial droop is noteworthy.  Unable to participate in finger-to-nose, strength  exam Psychiatric:     Acutely delirious, noninteractive  Data Reviewed: Worsening creatinine at 2.3.  Hemoglobin down to 6.5.  BNP at 278.  White blood cell count up to 14.9.  Disposition: Status is: Inpatient Prior to discharge needs -Improvement in mentation -Further evaluation of embolic CVA -Cardiac cath -Disposition determination once stabilized -Determination of any intervention for retroperitoneal bleed    35 minutes spent in the care of this critically ill patient including medical decision making, discussion of care with specialists, review and interpretation of labs and radiology studies and physical examination.  Author: Annita Brod, MD 08/10/2022 3:56 PM  For on call review [email or web address].

## 2022-08-10 NOTE — Progress Notes (Signed)
Neurology progress note  S: Patient received 0.'25mg'$  ativan and NGT placed just prior to my exam. He is awake but not following commands.  O:   Vitals:   08/10/22 1700 08/10/22 1800  BP: (!) 140/54 (!) 154/77  Pulse: (!) 27 73  Resp: (!) 25 (!) 31  Temp:    SpO2: 95% 93%   Gen: alert, altered Resp: normal WOB CV: RRR  MS: alert, altered, does not attend to examiner, does not answer orientation questions or follow simple commands Speech: no intelligible speech CN: PERRL, does not blink to threat, (+) corneals and oculocephalics, UMN L facial droop Motor & sensory: flexion in BUE to noxious stimuli, withdrawal BLE to noxious stimuli Reflexes: 2+ symm throughout toes w/d only bilat Coordination, gait: UTA  NIHSS components Score: Comment  1a Level of Conscious 0'[x]'$  1'[]'$  2'[]'$  3'[]'$      1b LOC Questions 0'[]'$  1'[]'$  2'[x]'$       1c LOC Commands 0'[]'$  1'[]'$  2'[x]'$       2 Best Gaze 0'[x]'$  1'[]'$  2'[]'$       3 Visual 0'[x]'$  1'[]'$  2'[]'$  3'[]'$      4 Facial Palsy 0'[]'$  1'[x]'$  2'[]'$  3'[]'$      5a Motor Arm - left 0'[]'$  1'[]'$  2'[x]'$  3'[]'$  4'[]'$  UN'[]'$    5b Motor Arm - Right 0'[]'$  1'[]'$  2'[x]'$  3'[]'$  4'[]'$  UN'[]'$    6a Motor Leg - Left 0'[]'$  1'[]'$  2'[]'$  3'[]'$  4'[x]'$  UN'[]'$    6b Motor Leg - Right 0'[]'$  1'[]'$  2'[]'$  3'[]'$  4'[x]'$  UN'[]'$    7 Limb Ataxia 0'[x]'$  1'[]'$  2'[]'$  3'[]'$  UN'[]'$     8 Sensory 0'[x]'$  1'[]'$  2'[]'$  UN'[]'$      9 Best Language 0'[]'$  1'[]'$  2'[x]'$  3'[]'$      10 Dysarthria 0'[]'$  1'[]'$  2'[x]'$  UN'[]'$      11 Extinct. and Inattention 0'[x]'$  1'[]'$  2'[]'$       TOTAL:  21    MRI brain wwo contrast MRA H&N  1. Multifocal acute ischemia, with the largest area being the right PCA territory. No hemorrhage or mass effect. 2. Proximal occlusion of the right PCA P1 segment. 3. 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 4. Severe stenosis of the left MCA proximal M2 inferior division.  CNS imaging personally reviewed; I agree with above interpretation   A/P: 77 yo gentleman presenting with sepsis 2/2 UTI (treated), COVID (+), new dx a fib and NSTEMI on whom neurology is consulted for acute infarcts. He  was on heparin gtt for a fib until 2/1 when his Hgb started to drop and it was placed on hold. 12 hrs prior to that he became confused and then he developed L facial droop on 2/2 prompting a CT head showing multiple infarcts. MRI brain confirmed multifocal acute ischemia, largest infarct in the R PCA territory, without hemorrhage or mass effect. BLE dopplers were negative. TTE showed no clot on admission but suspect source was cardioembolic from source not seen on TTE.   - Permissive HTN x48 hrs from sx onset goal BP <220/110. PRN labetalol or hydralazine if BP above these parameters. Avoid oral antihypertensives. Patient is on cardizem gtt for a fib w/ RVR, recommend as much permissive HTN as this allows - TTE completed last week showed no clot, order add-on bubble - BLE [place] neg for DVT - Check A1c and LDL + add statin per guidelines - Anticoag, antiplatelets on hold 2/2 blood loss anemia requiring transfusion yesterday and the day prior. Unclear where source of bleeding is; workup ongoing - q4 hr neuro checks - STAT head CT  for any change in neuro exam - Tele - PT/OT/SLP - Stroke education - Amb referral to neurology upon discharge - Will continue to follow  Su Monks, MD [facility] [phone]  If 7pm- 7am, please page neurology on call as listed in [place].

## 2022-08-10 NOTE — Progress Notes (Signed)
Rounding Note    Patient Name: Russell Byrd Date of Encounter: 08/10/2022  [facility] Cardiologist: Dr. Rockey Situ  Subjective   Went into afib yesterday around 11:23 AM, converted to NSR overnight around 3:50 AM. This AM, no family at bedside. Received PRBC yesterday, Hgb incremented.  Inpatient Medications    Scheduled Meds:  sodium chloride   Intravenous Once   vitamin C  500 mg Oral BID   Chlorhexidine Gluconate Cloth  6 each Topical Daily   insulin aspart  0-5 Units Subcutaneous QHS   insulin aspart  0-9 Units Subcutaneous TID WC   isosorbide dinitrate  20 mg Oral TID   multivitamin with minerals  1 tablet Oral Daily   pantoprazole  20 mg Oral Daily   traZODone  25 mg Oral QHS   zinc sulfate  220 mg Oral Daily   Continuous Infusions:  diltiazem (CARDIZEM) infusion Stopped (08/09/22 2311)   PRN Meds: acetaminophen **OR** acetaminophen, guaiFENesin, LORazepam, magnesium hydroxide, menthol-cetylpyridinium, metoprolol tartrate, morphine injection, ondansetron **OR** ondansetron (ZOFRAN) IV   Vital Signs    Vitals:   08/10/22 0400 08/10/22 0500 08/10/22 0600 08/10/22 0703  BP: (!) 120/44 (!) 105/42 (!) 121/47 (!) 141/54  Pulse: 64 61 (!) 56   Resp: (!) 28 (!) 27 (!) 26   Temp:      TempSrc:      SpO2: 96% 95% 90%   Weight:  77.8 kg    Height:        Intake/Output Summary (Last 24 hours) at 08/10/2022 1111 Last data filed at 08/10/2022 0700 Gross per 24 hour  Intake 2174.7 ml  Output 1195 ml  Net 979.7 ml      08/10/2022    5:00 AM 08/09/2022   12:30 PM 08/01/2022    9:15 PM  Last 3 Weights  Weight (lbs) 171 lb 8.3 oz 168 lb 14 oz 164 lb 10.9 oz  Weight (kg) 77.8 kg 76.6 kg 74.7 kg      Telemetry    Went into afib 08/09/22 11:23 AM, converted to SR 3:50 AM today- Personally Reviewed  ECG    SR at 64 bpm - Personally Reviewed  Physical Exam   GEN: Well nourished, well developed in no acute distress NECK: No JVD CARDIAC: regular rhythm,  normal S1 and S2, no rubs or gallops. No murmur. VASCULAR: Radial pulses 2+ bilaterally.  RESPIRATORY:  Clear to auscultation without rales, wheezing or rhonchi  ABDOMEN: Soft, non-tender, non-distended MUSCULOSKELETAL:  Moves all 4 limbs independently SKIN: Warm and dry, no edema NEUROLOGIC/PSYCHIATRIC: minimally interactive  Labs    High Sensitivity Troponin:   Recent Labs  Lab 07/21/2022 1924 08/01/22 0545 08/01/22 0802 08/09/22 1952 08/10/22 0347  TROPONINIHS 2,131* 2,602* 2,867* 111* 117*     Chemistry Recent Labs  Lab 08/04/22 0535 07/16/2022 0430 08/06/22 0355 08/07/22 0146 08/09/22 0257 08/09/22 1934 08/10/22 0347  NA 134* 135 134*   < > 139 140 143  K 3.3* 4.4 4.1   < > 4.5 4.0 4.1  CL 109 106 105   < > 107 108 114*  CO2 21* 21* 23   < > 21* 23 17*  GLUCOSE 191* 178* 153*   < > 181* 181* 156*  BUN 24* 25* 22   < > 51* 49* 45*  CREATININE 1.23 1.11 1.18   < > 2.21* 1.95* 1.81*  CALCIUM 7.6* 7.9* 7.5*   < > 7.9* 8.0* 7.9*  MG 1.5* 2.0 1.9  --   --   --   --  PROT 5.9* 5.9* 5.6*  --   --   --   --   ALBUMIN 2.5* 2.5* 2.3*  --   --   --   --   AST 40 64* 103*  --   --   --   --   ALT 35 52* 82*  --   --   --   --   ALKPHOS 49 47 50  --   --   --   --   BILITOT 0.6 0.6 0.6  --   --   --   --   GFRNONAA >60 >60 >60   < > 30* 35* 38*  ANIONGAP 4* 8 6   < > '11 9 12   '$ < > = values in this interval not displayed.    Lipids  Recent Labs  Lab 08/10/22 0347  CHOL 101  TRIG 164*  HDL 22*  LDLCALC 46  CHOLHDL 4.6    Hematology Recent Labs  Lab 08/09/22 0257 08/09/22 1934 08/10/22 0347  WBC 14.9* 15.4* 13.8*  RBC 2.19* 2.89* 3.47*  HGB 6.5* 8.9* 10.6*  HCT 19.5* 26.3* 33.5*  MCV 89.0 91.0 96.5  MCH 29.7 30.8 30.5  MCHC 33.3 33.8 31.6  RDW 12.9 13.1 13.5  PLT 274 269 235   Thyroid No results for input(s): "TSH", "FREET4" in the last 168 hours.  BNP Recent Labs  Lab 08/09/22 0257 08/09/22 1934  BNP 278.2* 420.0*    DDimer No results for input(s):  "DDIMER" in the last 168 hours.   Radiology    CT ABDOMEN PELVIS WO CONTRAST  Result Date: 08/10/2022 CLINICAL DATA:  Evaluate for retroperitoneal hematoma. Patient on heparin with persistent anemia. EXAM: CT ABDOMEN AND PELVIS WITHOUT CONTRAST TECHNIQUE: Multidetector CT imaging of the abdomen and pelvis was performed following the standard protocol without IV contrast. RADIATION DOSE REDUCTION: This exam was performed according to the departmental dose-optimization program which includes automated exposure control, adjustment of the mA and/or kV according to patient size and/or use of iterative reconstruction technique. COMPARISON:  08/04/2022 FINDINGS: Lower chest: Small bilateral pleural effusions with overlying atelectasis identified. Cardiac enlargement and coronary artery calcifications. Hepatobiliary: No focal liver abnormality is seen. No gallstones, gallbladder wall thickening, or biliary dilatation. Pancreas: Unremarkable. No pancreatic ductal dilatation or surrounding inflammatory changes. Spleen: Normal in size without focal abnormality. Adrenals/Urinary Tract: Left adrenal nodule measures 1.7 cm and is stable from 05/02/2021 most likely representing a benign adenoma. No follow-up imaging recommended. No nephrolithiasis or hydronephrosis. Bilateral kidney cysts are unchanged. No follow-up imaging recommended. The left retroperitoneal hematoma has mass effect upon the mid left ureter resulting in its medial deviation. No signs of significant hydronephrosis. Urinary bladder is partially decompressed around a Foley catheter balloon with gas in the lumen. Stomach/Bowel: Stomach appears normal. There is no bowel wall thickening, inflammation or distension. Distal colonic diverticulosis without signs of acute diverticulitis. Vascular/Lymphatic: Aortic atherosclerosis. No aneurysm. Partially calcified soft tissue mass within the central mesentery is unchanged, image 31/2. No abdominopelvic adenopathy.  Reproductive: Prostate is unremarkable. Other: Left retroperitoneal hematoma is identified and appears new compared with the previous exam. This measures 10.8 by 17.1 x 9.7 cm (volume = 940 cm^3), image 39/5 and image 47/2 there is acute on subacute components within this mass. Within the left pelvic sidewall there is a small intramuscular hematoma which measures 4.0 x 2.3 by 9.2 cm (volume = 44 cm^3), image 77/2 and image 62/5. Small amount of hemorrhage is identified within the presacral  soft tissue space, image 77/2. There is also a small amount of right retroperitoneal hemorrhage, image 55/2. No signs of pneumoperitoneum. Anasarca identified within the body wall bilaterally. Musculoskeletal: No acute osseous abnormality. Lumbar degenerative disc disease. IMPRESSION: 1. Left retroperitoneal hematoma is identified and appears new compared with the previous exam. This measures 10.8 x 17.1 x 9.7 cm (volume = 940 cc). There is acute on subacute blood products within this mass. 2. There is a small intramuscular hematoma within the left pelvic sidewall which measures 4.0 x 2.3 by 9.2 cm (volume = 44 cc). 3. Small amount of hemorrhage is identified within the presacral soft tissue space. There is also a small amount of right retroperitoneal hemorrhage. 4. Small bilateral pleural effusions with overlying atelectasis. 5. Cardiac enlargement and coronary artery calcifications. 6. Stable appearance of partially calcified soft tissue mass within the central mesentery. 7.  Aortic Atherosclerosis (ICD10-I70.0). Electronically Signed   By: Kerby Moors M.D.   On: 08/10/2022 11:03   MR BRAIN W WO CONTRAST  Result Date: 08/09/2022 CLINICAL DATA:  Acute neurologic deficit EXAM: MRI HEAD WITHOUT AND WITH CONTRAST MRA HEAD WITHOUT CONTRAST MRA NECK WITHOUT AND WITH CONTRAST TECHNIQUE: Multiplanar, multiecho pulse sequences of the brain and surrounding structures were obtained without and with intravenous contrast. Angiographic  images of the Circle of Willis were obtained using MRA technique without intravenous contrast. Angiographic images of the neck were obtained using MRA technique without and with intravenous contrast. Carotid stenosis measurements (when applicable) are obtained utilizing NASCET criteria, using the distal internal carotid diameter as the denominator. CONTRAST:  16m GADAVIST GADOBUTROL 1 MMOL/ML IV SOLN COMPARISON:  Head CT 08/09/2022 FINDINGS: MRI HEAD FINDINGS Brain: There is multifocal acute ischemia, with the largest area being the right PCA territory. There is also a right ACA territory infarct affecting the inferior right frontal lobe. There are smaller foci of acute ischemia within the thalami. No chronic microhemorrhage or siderosis. There is multifocal hyperintense T2-weighted signal within the white matter. Generalized volume loss. The midline structures are normal. There is no abnormal contrast enhancement. Vascular: Normal flow voids. Skull and upper cervical spine: Normal marrow signal. Sinuses/Orbits: Negative. Other: None. MRA HEAD FINDINGS POSTERIOR CIRCULATION: --Vertebral arteries: Normal --Inferior cerebellar arteries: Normal. --Basilar artery: Normal. --Superior cerebellar arteries: Normal. --Posterior cerebral arteries: Proximal occlusion of the right PCA P1 segment. Normal left PCA. ANTERIOR CIRCULATION: --Intracranial internal carotid arteries: Normal. --Anterior cerebral arteries (ACA): Severe stenosis of the mid A2 segment of the right ACA. Left ACA normal. --Middle cerebral arteries (MCA): Severe stenosis of the left MCA proximal M2 inferior division. Normal right MCA. MRA NECK FINDINGS Normal carotid and vertebral systems.  Visible aorta is normal. IMPRESSION: 1. Multifocal acute ischemia, with the largest area being the right PCA territory. No hemorrhage or mass effect. 2. Proximal occlusion of the right PCA P1 segment. 3. 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 4. Severe  stenosis of the left MCA proximal M2 inferior division. Electronically Signed   By: KUlyses JarredM.D.   On: 08/09/2022 22:59   MR ANGIO HEAD WO CONTRAST  Result Date: 08/09/2022 CLINICAL DATA:  Acute neurologic deficit EXAM: MRI HEAD WITHOUT AND WITH CONTRAST MRA HEAD WITHOUT CONTRAST MRA NECK WITHOUT AND WITH CONTRAST TECHNIQUE: Multiplanar, multiecho pulse sequences of the brain and surrounding structures were obtained without and with intravenous contrast. Angiographic images of the Circle of Willis were obtained using MRA technique without intravenous contrast. Angiographic images of the neck were obtained  using MRA technique without and with intravenous contrast. Carotid stenosis measurements (when applicable) are obtained utilizing NASCET criteria, using the distal internal carotid diameter as the denominator. CONTRAST:  14m GADAVIST GADOBUTROL 1 MMOL/ML IV SOLN COMPARISON:  Head CT 08/09/2022 FINDINGS: MRI HEAD FINDINGS Brain: There is multifocal acute ischemia, with the largest area being the right PCA territory. There is also a right ACA territory infarct affecting the inferior right frontal lobe. There are smaller foci of acute ischemia within the thalami. No chronic microhemorrhage or siderosis. There is multifocal hyperintense T2-weighted signal within the white matter. Generalized volume loss. The midline structures are normal. There is no abnormal contrast enhancement. Vascular: Normal flow voids. Skull and upper cervical spine: Normal marrow signal. Sinuses/Orbits: Negative. Other: None. MRA HEAD FINDINGS POSTERIOR CIRCULATION: --Vertebral arteries: Normal --Inferior cerebellar arteries: Normal. --Basilar artery: Normal. --Superior cerebellar arteries: Normal. --Posterior cerebral arteries: Proximal occlusion of the right PCA P1 segment. Normal left PCA. ANTERIOR CIRCULATION: --Intracranial internal carotid arteries: Normal. --Anterior cerebral arteries (ACA): Severe stenosis of the mid A2 segment  of the right ACA. Left ACA normal. --Middle cerebral arteries (MCA): Severe stenosis of the left MCA proximal M2 inferior division. Normal right MCA. MRA NECK FINDINGS Normal carotid and vertebral systems.  Visible aorta is normal. IMPRESSION: 1. Multifocal acute ischemia, with the largest area being the right PCA territory. No hemorrhage or mass effect. 2. Proximal occlusion of the right PCA P1 segment. 3. 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 4. Severe stenosis of the left MCA proximal M2 inferior division. Electronically Signed   By: KUlyses JarredM.D.   On: 08/09/2022 22:59   MR ANGIO NECK W WO CONTRAST  Result Date: 08/09/2022 CLINICAL DATA:  Acute neurologic deficit EXAM: MRI HEAD WITHOUT AND WITH CONTRAST MRA HEAD WITHOUT CONTRAST MRA NECK WITHOUT AND WITH CONTRAST TECHNIQUE: Multiplanar, multiecho pulse sequences of the brain and surrounding structures were obtained without and with intravenous contrast. Angiographic images of the Circle of Willis were obtained using MRA technique without intravenous contrast. Angiographic images of the neck were obtained using MRA technique without and with intravenous contrast. Carotid stenosis measurements (when applicable) are obtained utilizing NASCET criteria, using the distal internal carotid diameter as the denominator. CONTRAST:  816mGADAVIST GADOBUTROL 1 MMOL/ML IV SOLN COMPARISON:  Head CT 08/09/2022 FINDINGS: MRI HEAD FINDINGS Brain: There is multifocal acute ischemia, with the largest area being the right PCA territory. There is also a right ACA territory infarct affecting the inferior right frontal lobe. There are smaller foci of acute ischemia within the thalami. No chronic microhemorrhage or siderosis. There is multifocal hyperintense T2-weighted signal within the white matter. Generalized volume loss. The midline structures are normal. There is no abnormal contrast enhancement. Vascular: Normal flow voids. Skull and upper cervical spine:  Normal marrow signal. Sinuses/Orbits: Negative. Other: None. MRA HEAD FINDINGS POSTERIOR CIRCULATION: --Vertebral arteries: Normal --Inferior cerebellar arteries: Normal. --Basilar artery: Normal. --Superior cerebellar arteries: Normal. --Posterior cerebral arteries: Proximal occlusion of the right PCA P1 segment. Normal left PCA. ANTERIOR CIRCULATION: --Intracranial internal carotid arteries: Normal. --Anterior cerebral arteries (ACA): Severe stenosis of the mid A2 segment of the right ACA. Left ACA normal. --Middle cerebral arteries (MCA): Severe stenosis of the left MCA proximal M2 inferior division. Normal right MCA. MRA NECK FINDINGS Normal carotid and vertebral systems.  Visible aorta is normal. IMPRESSION: 1. Multifocal acute ischemia, with the largest area being the right PCA territory. No hemorrhage or mass effect. 2. Proximal occlusion of the  right PCA P1 segment. 3. 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 4. Severe stenosis of the left MCA proximal M2 inferior division. Electronically Signed   By: Ulyses Jarred M.D.   On: 08/09/2022 22:59   US Venous Img Lower Bilateral (DVT)  Result Date: 08/09/2022 CLINICAL DATA:  Stroke EXAM: BILATERAL LOWER EXTREMITY VENOUS DOPPLER ULTRASOUND TECHNIQUE: Gray-scale sonography with graded compression, as well as color Doppler and duplex ultrasound were performed to evaluate the lower extremity deep venous systems from the level of the common femoral vein and including the common femoral, femoral, profunda femoral, popliteal and calf veins including the posterior tibial, peroneal and gastrocnemius veins when visible. The superficial great saphenous vein was also interrogated. Spectral Doppler was utilized to evaluate flow at rest and with distal augmentation maneuvers in the common femoral, femoral and popliteal veins. COMPARISON:  None Available. FINDINGS: RIGHT LOWER EXTREMITY Common Femoral Vein: No evidence of thrombus. Normal compressibility,  respiratory phasicity and response to augmentation. Saphenofemoral Junction: No evidence of thrombus. Normal compressibility and flow on color Doppler imaging. Profunda Femoral Vein: No evidence of thrombus. Normal compressibility and flow on color Doppler imaging. Femoral Vein: No evidence of thrombus. Normal compressibility, respiratory phasicity and response to augmentation. Popliteal Vein: No evidence of thrombus. Normal compressibility, respiratory phasicity and response to augmentation. Calf Veins: No evidence of thrombus. Normal compressibility and flow on color Doppler imaging. LEFT LOWER EXTREMITY Common Femoral Vein: No evidence of thrombus. Normal compressibility, respiratory phasicity and response to augmentation. Saphenofemoral Junction: No evidence of thrombus. Normal compressibility and flow on color Doppler imaging. Profunda Femoral Vein: No evidence of thrombus. Normal compressibility and flow on color Doppler imaging. Femoral Vein: No evidence of thrombus. Normal compressibility, respiratory phasicity and response to augmentation. Popliteal Vein: No evidence of thrombus. Normal compressibility, respiratory phasicity and response to augmentation. Calf Veins: No evidence of thrombus. Normal compressibility and flow on color Doppler imaging. IMPRESSION: No evidence of deep venous thrombosis in either lower extremity. Electronically Signed   By: Albin Felling M.D.   On: 08/09/2022 14:14   CT HEAD WO CONTRAST (5MM)  Result Date: 08/09/2022 CLINICAL DATA:  Altered mental status, facial droop. EXAM: CT HEAD WITHOUT CONTRAST TECHNIQUE: Contiguous axial images were obtained from the base of the skull through the vertex without intravenous contrast. RADIATION DOSE REDUCTION: This exam was performed according to the departmental dose-optimization program which includes automated exposure control, adjustment of the mA and/or kV according to patient size and/or use of iterative reconstruction technique.  COMPARISON:  Head CT dated 08/01/2022. FINDINGS: Brain: New area of low-density within the RIGHT lower occipital-temporal lobe. Associated sulcal effacement. No associated midline shift or herniation. Additional new area of low-density within the lower RIGHT frontal lobe. Chronic small vessel ischemic changes within the bilateral periventricular white matter regions. No parenchymal or extra-axial hemorrhage. Vascular: Chronic calcified atherosclerotic changes of the large vessels at the skull base. No unexpected hyperdense vessel. Skull: Normal. Negative for fracture or focal lesion. Sinuses/Orbits: No acute finding. Other: None. IMPRESSION: 1. New areas of low-density within the lower RIGHT occipital-temporal lobe and lower RIGHT frontal lobe, consistent with edema, and consistent with acute-to-subacute infarcts. Given the multifocality, findings are suspicious for emboli. Recommend brain MRI for further characterization. 2. No intracranial hemorrhage. 3. Chronic small vessel ischemic changes within the bilateral periventricular white matter regions. Critical Value/emergent results were called by telephone at the time of interpretation on 08/09/2022 at 10:12 am to provider [facility] , who verbally  acknowledged these results. Electronically Signed   By: Franki Cabot M.D.   On: 08/09/2022 10:12    Cardiac Studies   TTE 08/01/22 1. Left ventricular ejection fraction, by estimation, is 55 to 60%. The  left ventricle has normal function. The left ventricle has no regional  wall motion abnormalities. There is mild left ventricular hypertrophy.  Left ventricular diastolic parameters  are indeterminate.   2. Right ventricular systolic function is normal. The right ventricular  size is normal. There is normal pulmonary artery systolic pressure.   3. The mitral valve is normal in structure. Mild to moderate mitral valve  regurgitation. No evidence of mitral stenosis.   4. The aortic valve is normal in  structure. Aortic valve regurgitation is  mild. No aortic stenosis is present.   5. The inferior vena cava is normal in size with greater than 50%  respiratory variability, suggesting right atrial pressure of 3 mmHg.   Patient Profile     77 y.o. male with a history of gastroesophageal reflux disease, hypertension, hyperlipidemia, gait instability, type 2 diabetes, who is being seen and evaluated for atrial fibrillation with RVR and NSTEMI.   Assessment & Plan    Non-STEMI vs. Demand ischemia -in the setting of being found down for an extended period of time, rapid afib, UTI, Covid and rhabdo as factors -ECG with anterolateral ST depressions, echo with preserved EF without wall motion abnormalities -there had been discussion regarding cath this admission. 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 Converted to NSR this AM -CHA2DS2/VAS Stroke Risk Points=6 -as above, despite elevated risk and recent stroke, he had blood loss anemia of unclear origin on heparin drip and cannot currently be anticoagulated -also as above, no plans for cath this admission. Once bleeding/anemia evaluated and stable, consider DOAC -diltiazem drip stopped. He is currently in SR with IV metoprolol. Recommend restarting oral metoprolol for better control of rate once clear for oral meds from neuro perspective   UTI Rhabdomyolysis Acute  CVA -per primary team, CCM, and neurology  [place] will sign off, but please contact us with questions or concerns Medication Recommendations:  See above. Once stable from bleeding/neuro perspective, consider DOAC, metoprolol, and atorvastatin as above Other recommendations (labs, testing, etc):  none Follow up as an outpatient:  After discharge, he will need outpatient follow up with Dr. Rockey Situ; please contact  the cardiology service once he nears discharge and we will arrange for an appointment.   CRITICAL CARE Patient is critically ill with multiple organ systems affected and requires high complexity decision making. Total critical care time: 35 minutes. This time includes gathering of history, evaluation of patient's response to treatment, examination of patient, review of laboratory and imaging studies, and coordination with consultants. Greater than 50% of time spent in direct patient care.   For questions or updates, please contact [place] Please consult [email or web address] for contact info under    Signed,  Buford Dresser, MD  08/10/2022, 11:11 AM

## 2022-08-10 NOTE — Progress Notes (Addendum)
      Daily Progress Note  I was asked to review this patient's studies.  Pt's chart briefly reviewed.  Pt has NOT undergone any instrumentation, so his bleed is a spontaneous bleeding on anticoagulation.  Further evaluation requires CTA abd/pelvis so the non-contrast CT is inadequate.  Most of these spontaneous bleeds are embolized by IR not Vascular Surgery.  - would start with CTA abd/pelvis to look for any continued bleeding - If no obvious contrast blush, chance of successful embolization unlikely   Adele Barthel, MD, FACS, FSVS Covering for [facility]: [phone]  08/10/2022, 5:18 PM

## 2022-08-10 NOTE — Progress Notes (Signed)
SLP Cancellation Note  Patient Details Name: Russell Byrd MRN: [id number] DOB: [birth year]   Cancelled treatment:       Reason Eval/Treat Not Completed: Medical issues which prohibited therapy;Patient's level of consciousness;Patient not medically ready (chart reviewed; consulted NSG re: pt's status today)  Upon observation at room, pt is lethargic/confused; Bilat. Mitts present. Per NSG, pt had a significant decline in status yesterday PM. Made NPO also. NSG note yesterday PM: "1930- patient responds to pain only moaning unable to make any needs known daughter at bedside MRI called will take patient down for MRI asap. Made patient NPO doesn't follow commands unsafe to have oral intake  2220- MRI completed patient restless PRN ativan not effective.". Pt is not appropriate at this time for a cognitive-linguistic evaluation in setting of decline in overall status since yesterday PM. ST services will f/u w/ pt's status for appropriate Time for a cognitive-linguistic evaluation; when pt can fully participate meaningfully. Pt could benefit from a BSE potentially to assess safety of swallow function when appropriate to return to an oral diet. NSG agreed.  Recommend frequent oral care for hygiene and stimulation of swallowing; aspiration precautions.      Orinda Kenner, MS, CCC-SLP Speech Language Pathologist Rehab Services; [place] [phone] (ascom) [name] 08/10/2022, 10:51 AM

## 2022-08-10 NOTE — Progress Notes (Signed)
PT Cancellation Note  Patient Details Name: Russell Byrd MRN: [id number] DOB: 06/14/1946   Cancelled Treatment:    Reason Eval/Treat Not Completed: Patient's level of consciousness. Per Neurology will hold and wait for improvement of mentation to initiate PT/OT.    [place] Noori A [place] 08/10/2022, 1:20 PM

## 2022-08-10 NOTE — Progress Notes (Addendum)
3736 patient converted to NSR 0415 12 lead completed for rhythm change

## 2022-08-10 NOTE — Progress Notes (Signed)
1945 patient has eyes open not following commands family said patient was having purposeful movement fulling at OG tube and foley when taking off mitts and asking patient unable to to follow commands or to make patient moving arms around but not purposeful. Ex-wife at bedside states patient is trying to talk with her when she ask him questions, unfortunately after multiple attempts I could not get patient to do anything purposeful at all patient as pupils corneal's cough and gag also has extension to any stimuli.  2015 patient very restless PRN pain medication given 2115 PRN anxiety medication given pain medication unsuccessful in helping patient relax

## 2022-08-11 DIAGNOSIS — I214 Non-ST elevation (NSTEMI) myocardial infarction: Secondary | ICD-10-CM | POA: Diagnosis not present

## 2022-08-11 DIAGNOSIS — A419 Sepsis, unspecified organism: Secondary | ICD-10-CM | POA: Diagnosis not present

## 2022-08-11 DIAGNOSIS — E119 Type 2 diabetes mellitus without complications: Secondary | ICD-10-CM

## 2022-08-11 DIAGNOSIS — T796XXA Traumatic ischemia of muscle, initial encounter: Secondary | ICD-10-CM | POA: Diagnosis not present

## 2022-08-11 DIAGNOSIS — I4891 Unspecified atrial fibrillation: Secondary | ICD-10-CM | POA: Diagnosis not present

## 2022-08-11 DIAGNOSIS — I639 Cerebral infarction, unspecified: Secondary | ICD-10-CM | POA: Diagnosis not present

## 2022-08-11 DIAGNOSIS — I480 Paroxysmal atrial fibrillation: Secondary | ICD-10-CM

## 2022-08-11 LAB — GLUCOSE, CAPILLARY
Glucose-Capillary: 112 mg/dL — ABNORMAL HIGH (ref 70–99)
Glucose-Capillary: 117 mg/dL — ABNORMAL HIGH (ref 70–99)
Glucose-Capillary: 125 mg/dL — ABNORMAL HIGH (ref 70–99)
Glucose-Capillary: 126 mg/dL — ABNORMAL HIGH (ref 70–99)
Glucose-Capillary: 75 mg/dL (ref 70–99)
Glucose-Capillary: 94 mg/dL (ref 70–99)

## 2022-08-11 LAB — BASIC METABOLIC PANEL
Anion gap: 9 (ref 5–15)
BUN: 40 mg/dL — ABNORMAL HIGH (ref 8–23)
CO2: 23 mmol/L (ref 22–32)
Calcium: 8.4 mg/dL — ABNORMAL LOW (ref 8.9–10.3)
Chloride: 115 mmol/L — ABNORMAL HIGH (ref 98–111)
Creatinine, Ser: 1.42 mg/dL — ABNORMAL HIGH (ref 0.61–1.24)
GFR, Estimated: 51 mL/min — ABNORMAL LOW (ref >60)
Glucose, Bld: 126 mg/dL — ABNORMAL HIGH (ref 70–99)
Potassium: 4 mmol/L (ref 3.5–5.1)
Sodium: 147 mmol/L — ABNORMAL HIGH (ref 135–145)

## 2022-08-11 LAB — CBC
HCT: 27.5 % — ABNORMAL LOW (ref 39.0–52.0)
Hemoglobin: 8.7 g/dL — ABNORMAL LOW (ref 13.0–17.0)
MCH: 30 pg (ref 26.0–34.0)
MCHC: 31.6 g/dL (ref 30.0–36.0)
MCV: 94.8 fL (ref 80.0–100.0)
Platelets: 278 10*3/uL (ref 150–400)
RBC: 2.9 MIL/uL — ABNORMAL LOW (ref 4.22–5.81)
RDW: 13.6 % (ref 11.5–15.5)
WBC: 12.4 10*3/uL — ABNORMAL HIGH (ref 4.0–10.5)
nRBC: 0.2 % (ref 0.0–0.2)

## 2022-08-11 LAB — PHOSPHORUS: Phosphorus: 2.2 mg/dL — ABNORMAL LOW (ref 2.5–4.6)

## 2022-08-11 LAB — MAGNESIUM: Magnesium: 2.1 mg/dL (ref 1.7–2.4)

## 2022-08-11 MED ORDER — ONDANSETRON HCL 4 MG/2ML IJ SOLN: 4.0000 mg | Freq: Four times a day (QID) | INTRAMUSCULAR | Status: DC | PRN

## 2022-08-11 MED ORDER — ACETAMINOPHEN 500 MG PO TABS
1000.0000 mg | ORAL_TABLET | Freq: Four times a day (QID) | ORAL | Status: DC | PRN
  Administered 2022-08-12 – 2022-08-17 (×4): 1000 mg
  Filled 2022-08-11 (×4): qty 2

## 2022-08-11 MED ORDER — HYDRALAZINE HCL 20 MG/ML IJ SOLN
10.0000 mg | Freq: Four times a day (QID) | INTRAMUSCULAR | Status: DC | PRN
  Administered 2022-08-11 – 2022-08-12 (×3): 10 mg via INTRAVENOUS
  Filled 2022-08-11 (×3): qty 1

## 2022-08-11 MED ORDER — INSULIN ASPART 100 UNIT/ML IJ SOLN
0.0000 [IU] | INTRAMUSCULAR | Status: DC
  Administered 2022-08-12: 1 [IU] via SUBCUTANEOUS
  Administered 2022-08-12: 2 [IU] via SUBCUTANEOUS
  Administered 2022-08-12: 3 [IU] via SUBCUTANEOUS
  Administered 2022-08-12 (×2): 5 [IU] via SUBCUTANEOUS
  Administered 2022-08-12 – 2022-08-13 (×3): 2 [IU] via SUBCUTANEOUS
  Administered 2022-08-13: 5 [IU] via SUBCUTANEOUS
  Administered 2022-08-13: 2 [IU] via SUBCUTANEOUS
  Administered 2022-08-13 – 2022-08-14 (×4): 3 [IU] via SUBCUTANEOUS
  Administered 2022-08-14 – 2022-08-15 (×9): 2 [IU] via SUBCUTANEOUS
  Administered 2022-08-15: 3 [IU] via SUBCUTANEOUS
  Filled 2022-08-11 (×21): qty 1

## 2022-08-11 MED ORDER — ONDANSETRON HCL 4 MG PO TABS: 4.0000 mg | ORAL_TABLET | Freq: Four times a day (QID) | ORAL | Status: DC | PRN

## 2022-08-11 MED ORDER — ACETAMINOPHEN 650 MG RE SUPP: 650.0000 mg | Freq: Four times a day (QID) | RECTAL | Status: DC | PRN

## 2022-08-11 MED ORDER — VITAL HIGH PROTEIN PO LIQD: 1000.0000 mL | ORAL | Status: DC

## 2022-08-11 MED ORDER — PROSOURCE TF20 ENFIT COMPATIBL EN LIQD
60.0000 mL | Freq: Every day | ENTERAL | Status: DC
  Administered 2022-08-11 – 2022-08-13 (×3): 60 mL
  Filled 2022-08-11 (×3): qty 60

## 2022-08-11 MED ORDER — ZINC SULFATE 220 (50 ZN) MG PO CAPS
220.0000 mg | ORAL_CAPSULE | Freq: Every day | ORAL | Status: DC
  Administered 2022-08-12 – 2022-08-18 (×7): 220 mg
  Filled 2022-08-11 (×7): qty 1

## 2022-08-11 MED ORDER — OSMOLITE 1.2 CAL PO LIQD
1000.0000 mL | ORAL | Status: DC
  Administered 2022-08-11 – 2022-08-13 (×3): 1000 mL

## 2022-08-11 MED ORDER — ISOSORBIDE DINITRATE 20 MG PO TABS
20.0000 mg | ORAL_TABLET | Freq: Three times a day (TID) | ORAL | Status: DC
  Administered 2022-08-11 – 2022-08-18 (×21): 20 mg
  Filled 2022-08-11 (×23): qty 1

## 2022-08-11 MED ORDER — FAMOTIDINE 20 MG PO TABS
20.0000 mg | ORAL_TABLET | Freq: Every day | ORAL | Status: DC
  Administered 2022-08-11 – 2022-08-18 (×8): 20 mg
  Filled 2022-08-11 (×8): qty 1

## 2022-08-11 MED ORDER — VITAMIN C 500 MG PO TABS
500.0000 mg | ORAL_TABLET | Freq: Two times a day (BID) | ORAL | Status: DC
  Administered 2022-08-11 – 2022-08-18 (×14): 500 mg
  Filled 2022-08-11 (×14): qty 1

## 2022-08-11 MED ORDER — TRAZODONE HCL 50 MG PO TABS
25.0000 mg | ORAL_TABLET | Freq: Every day | ORAL | Status: DC
  Administered 2022-08-11 – 2022-08-17 (×7): 25 mg
  Filled 2022-08-11 (×7): qty 1

## 2022-08-11 MED ORDER — GUAIFENESIN 100 MG/5ML PO LIQD: 5.0000 mL | ORAL | Status: DC | PRN

## 2022-08-11 MED ORDER — ADULT MULTIVITAMIN W/MINERALS CH
1.0000 | ORAL_TABLET | Freq: Every day | ORAL | Status: DC
  Administered 2022-08-12 – 2022-08-13 (×2): 1
  Filled 2022-08-11 (×2): qty 1

## 2022-08-11 MED ORDER — FREE WATER
100.0000 mL | Freq: Four times a day (QID) | Status: DC
  Administered 2022-08-11 – 2022-08-17 (×23): 100 mL

## 2022-08-11 NOTE — Progress Notes (Signed)
SLP Cancellation Note  Patient Details Name: SAXTON CHAIN MRN: [id number] DOB: 03-21-1946   Cancelled treatment:       Reason Eval/Treat Not Completed: Medical issues which prohibited therapy;Patient's level of consciousness;Patient not medically ready. Per chart review, pt's LOA not appropriate for SLP evaluations. Will continue efforts as appropriate.  Cherrie Gauze, M.S., [facility] [phone] [place])  Quintella Baton 08/11/2022, 11:28 AM

## 2022-08-11 NOTE — Progress Notes (Signed)
Nutrition Follow-up RD working remotely.   DOCUMENTATION CODES:   Not applicable  INTERVENTION:  - ordered Osmolite 1.2 @ 25 ml/hr to advance by 10 ml every 12 hours to reach goal rate of 65 ml/hr with 60 ml Prosource TF20 once/day and 100 ml water QID.  - at goal rate, this regimen will provide 1952 kcal, 106 grams protein, and 1679 ml water.   NUTRITION DIAGNOSIS:   Inadequate oral intake related to lethargy/confusion as evidenced by per patient/family report. -ongoing  GOAL:   Patient will meet greater than or equal to 90% of their needs -unmet, unable to meet without TF  MONITOR:   TF tolerance, Labs, Weight trends, Skin  REASON FOR ASSESSMENT:   Consult Enteral/tube feeding initiation and management  ASSESSMENT:   Pt with past medical history of paroxysmal atrial fibrillation, diabetes mellitus who presents with confusion and fall and had been on the floor for 3 days prior to being found.  Patient was most recently seen by a RD on 2/2. SLP unable to evaluate patient today due to level of consciousness. NGT placed in L nare yesterday evening; side port in stomach per abdominal x-ray on 2/3.    Weight today is 169 lb and weight has fluctuated throughout hospitalization (165-176 lb). Non-pitting edema to BLE and mild pitting edema to BUE.    Labs reviewed; CBGs: 94-126 mg/dl, Na: 147 mmol/l, Cl: 115 mmol/l, BUN: 40 mg/dl, creatinine: 1.42 mg/dl, Ca: 8.4 mg/dl, GFR: 51 ml/min.  Medications reviewed; 500 mg ascorbic acid/day, 20 mg pepcid/day, sliding scale novolog, 1 tablet multivitamin/day per tube, 220 mg zinc sulfate/day.   Diet Order:   Diet Order             Diet NPO time specified  Diet effective now                   EDUCATION NEEDS:   No education needs have been identified at this time  Skin:  Skin Assessment: Skin Integrity Issues: Skin Integrity Issues:: Stage II Stage II: coccyx, lt thigh  Last BM:  2/1 (type 6 x1, medium  amount)  Height:   Ht Readings from Last 1 Encounters:  08/01/22 '5\' 7"'$  (1.702 m)    Weight:   Wt Readings from Last 1 Encounters:  08/11/22 76.5 kg     BMI:  Body mass index is 26.41 kg/m.  Estimated Nutritional Needs:  Kcal:  1800-2000 Protein:  90-105 grams Fluid:  > 1.8 L     Jarome Matin, MS, RD, LDN, CNSC Clinical Dietitian PRN/Relief staff On-call/weekend pager # available in [facility]

## 2022-08-11 NOTE — Progress Notes (Addendum)
Neurology progress note  S: Patient found to have RP bleed explaining his anemia. No new neurologic events. Remains altered.  O:   Vitals:   08/11/22 1800 08/11/22 1900  BP:    Pulse: 80 82  Resp: (!) 26 (!) 23  Temp:    SpO2: 96% 95%   Gen: alert, altered Resp: normal WOB CV: RRR  MS: alert, altered, does not attend to examiner, does not answer orientation questions or follow simple commands Speech: no intelligible speech CN: PERRL, does not blink to threat, (+) corneals and oculocephalics, UMN L facial droop Motor & sensory: flexion in BUE to noxious stimuli, withdrawal BLE to noxious stimuli. +eyelid apraxia Reflexes: 2+ symm throughout toes w/d only bilat Coordination, gait: UTA  NIHSS components Score: Comment  1a Level of Conscious 0'[x]'$  1'[]'$  2'[]'$  3'[]'$      1b LOC Questions 0'[]'$  1'[]'$  2'[x]'$       1c LOC Commands 0'[]'$  1'[]'$  2'[x]'$       2 Best Gaze 0'[x]'$  1'[]'$  2'[]'$       3 Visual 0'[x]'$  1'[]'$  2'[]'$  3'[]'$      4 Facial Palsy 0'[]'$  1'[x]'$  2'[]'$  3'[]'$      5a Motor Arm - left 0'[]'$  1'[]'$  2'[x]'$  3'[]'$  4'[]'$  UN'[]'$    5b Motor Arm - Right 0'[]'$  1'[]'$  2'[x]'$  3'[]'$  4'[]'$  UN'[]'$    6a Motor Leg - Left 0'[]'$  1'[]'$  2'[]'$  3'[]'$  4'[x]'$  UN'[]'$    6b Motor Leg - Right 0'[]'$  1'[]'$  2'[]'$  3'[]'$  4'[x]'$  UN'[]'$    7 Limb Ataxia 0'[x]'$  1'[]'$  2'[]'$  3'[]'$  UN'[]'$     8 Sensory 0'[x]'$  1'[]'$  2'[]'$  UN'[]'$      9 Best Language 0'[]'$  1'[]'$  2'[x]'$  3'[]'$      10 Dysarthria 0'[]'$  1'[]'$  2'[x]'$  UN'[]'$      11 Extinct. and Inattention 0'[x]'$  1'[]'$  2'[]'$       TOTAL:  21    MRI brain wwo contrast MRA H&N  1. Multifocal acute ischemia, with the largest area being the right PCA territory. No hemorrhage or mass effect. 2. Proximal occlusion of the right PCA P1 segment. 3. 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 4. Severe stenosis of the left MCA proximal M2 inferior division.  CNS imaging personally reviewed; I agree with above interpretation   A/P: 77 yo gentleman presenting with sepsis 2/2 UTI (treated), COVID (+), new dx a fib and NSTEMI on whom neurology is consulted for acute infarcts. He was on heparin gtt  for a fib until 2/1 when his Hgb started to drop and it was placed on hold. 12 hrs prior to that he became confused and then he developed L facial droop on 2/2 prompting a CT head showing multiple infarcts. MRI brain confirmed multifocal acute ischemia, largest infarct in the R PCA territory, without hemorrhage or mass effect. BLE dopplers were negative. TTE showed no clot on admission but suspect source was cardioembolic from source not seen on TTE.   Patient found to have RP bleed preventing administration of antiplatelets at this time. Anticoagulation would be contraindicated in the setting of acute large ischemic infarct and is certainly contraindicated in the setting of RP bleed.  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  - Goal normotension, avoid hypotension - TTE completed last week showed no clot - BLE [place] neg for DVT - Check A1c and LDL + add statin per guidelines - Antiplatelets on hold in setting of RP bleed, restart when able - q4 hr neuro checks - STAT head CT for any change in neuro  exam - Tele - PT/OT/SLP - Stroke education - Amb referral to neurology upon discharge - Will continue to follow  Su Monks, MD [facility] [phone]  If 7pm- 7am, please page neurology on call as listed in [place].

## 2022-08-11 NOTE — Evaluation (Signed)
Physical Therapy RE-Evaluation Patient Details Name: Russell Byrd MRN: [id number] DOB: 1946-01-25 Today's Date: 08/11/2022  History of Present Illness  Patient is a 77 year old male with NSTEMI and atrial fibrillation with rapid ventricular response in the setting of altered mental status after lying on the floor for 3 days. Complicated by [id number]-51 infection with cardiac catheterization on hold pending improvement in infection. Stay complicated by acute CVA in R PCA and noted cognition change. Re-evaluation performed this date.  Clinical Impression  RE-evaluation performed this date. Pt is a pleasant 77 year old male who was admitted for NSTEMI and now, new CVA. Pt performs bed mobility with total assist and is unable to further perform transfers/ambulation at this time. Pt demonstrates deficits with strength/mobility/cognition. Appears to have tone in R UE/LE. Unable to open eyes. Placed wash cloth on face, unable to move and didn't increase alertness. Would benefit from skilled PT to address above deficits and promote optimal return to PLOF. At this time, due to cognition, pt will be placed on PT trial to assess ability to consistently participate in skilled and meaningful services. Will plan on 3 session trial for continued assessment.     Recommendations for follow up therapy are one component of a multi-disciplinary discharge planning process, led by the attending physician.  Recommendations may be updated based on patient status, additional functional criteria and insurance authorization.  Follow Up Recommendations Skilled nursing-short term rehab (<3 hours/day) Can patient physically be transported by private vehicle: No    Assistance Recommended at Discharge Frequent or constant Supervision/Assistance  Patient can return home with the following  Two people to help with walking and/or transfers;Two people to help with bathing/dressing/bathroom    Equipment Recommendations  (TBD)   Recommendations for Other Services       Functional Status Assessment Patient has had a recent decline in their functional status and/or demonstrates limited ability to make significant improvements in function in a reasonable and predictable amount of time     Precautions / Restrictions Precautions Precautions: Fall Restrictions Weight Bearing Restrictions: No      Mobility  Bed Mobility Overal bed mobility: Needs Assistance Bed Mobility: Rolling Rolling: Total assist         General bed mobility comments: unable to assist in rolling. Attempted to reach arms to assist, unable to follow commands.    Transfers                   General transfer comment: Unable    Ambulation/Gait                  Stairs            Wheelchair Mobility    Modified Rankin (Stroke Patients Only)       Balance                                             Pertinent Vitals/Pain Pain Assessment Pain Assessment: PAINAD Breathing: occasional labored breathing, short period of hyperventilation Negative Vocalization: occasional moan/groan, low speech, negative/disapproving quality Facial Expression: facial grimacing Body Language: rigid, fists clenched, knees up, pushing/pulling away, strikes out Consolability: no need to console PAINAD Score: 6 Pain Intervention(s): Limited activity within patient's tolerance    Home Living Family/patient expects to be discharged to:: Private residence Living Arrangements: Alone Available Help at Discharge: Family;Available PRN/intermittently Type of Home:  House Home Access: Stairs to enter   [employer] of Steps: 3   Home Layout: One level Home Equipment: [place] - single point Additional Comments: Need to determine bathroom set up another session; pt with AMS and distractible today. History obtained from previous chart- pt non verbal and unable to give accurate history    Prior Function Prior  Level of Function : Independent/Modified Independent;Driving             Mobility Comments: independent without device. history of falls- previous note as pt unable to given history at this time ADLs Comments: (I) ADL/IADL.     Hand Dominance        Extremity/Trunk Assessment   Upper Extremity Assessment Upper Extremity Assessment: Difficult to assess due to impaired cognition (spontaneous movement noted in B UE appears grossly 3/5- tone noted in R UE) RUE Deficits / Details: fist tightly clenched    Lower Extremity Assessment Lower Extremity Assessment: Difficult to assess due to impaired cognition (tone noted in R LE in extensor pattern. Unable to formally assess)       Communication   Communication: HOH  Cognition Arousal/Alertness: Suspect due to medications Behavior During Therapy: Restless Overall Cognitive Status: Impaired/Different from baseline                                 General Comments: unable to speak and keeps eyes closed. Unable to follow commands        General Comments      Exercises     Assessment/Plan    PT Assessment Patient needs continued PT services  PT Problem List Decreased strength;Decreased range of motion;Decreased activity tolerance;Decreased balance;Decreased mobility;Decreased cognition;Decreased safety awareness;Decreased knowledge of precautions       PT Treatment Interventions DME instruction;Gait training;Stair training;Functional mobility training;Therapeutic activities;Therapeutic exercise;Balance training;Cognitive remediation;Neuromuscular re-education;Patient/family education    PT Goals (Current goals can be found in the Care Plan section)  Acute Rehab PT Goals Patient Stated Goal: none stated PT Goal Formulation: Patient unable to participate in goal setting Time For Goal Achievement: 08/25/22 Potential to Achieve Goals: Fair    Frequency Min 2X/week     Co-evaluation                AM-PAC PT "6 Clicks" Mobility  Outcome Measure Help needed turning from your back to your side while in a flat bed without using bedrails?: Total Help needed moving from lying on your back to sitting on the side of a flat bed without using bedrails?: Total Help needed moving to and from a bed to a chair (including a wheelchair)?: Total Help needed standing up from a chair using your arms (e.g., wheelchair or bedside chair)?: Total Help needed to walk in hospital room?: Total Help needed climbing 3-5 steps with a railing? : Total 6 Click Score: 6    End of Session   Activity Tolerance: Treatment limited secondary to agitation Patient left: in bed;with bed alarm set Nurse Communication: Mobility status PT Visit Diagnosis: Unsteadiness on feet (R26.81);Muscle weakness (generalized) (M62.81)    Time: 1287-8676 PT Time Calculation (min) (ACUTE ONLY): 12 min   Charges:   PT Evaluation $PT Re-evaluation: 1 Re-eval          Greggory Stallion, PT, DPT, GCS [phone]   Tauno Falotico 08/11/2022, 10:49 AM

## 2022-08-11 NOTE — Progress Notes (Signed)
Progress Note   Patient: Russell Byrd SVX:[id number] DOB: Jan 07, 1946 DOA: 07/24/2022     11 DOS: the patient was seen and examined on 08/11/2022   Brief hospital course: 77 year old male with past medical history of paroxysmal atrial fibrillation, diabetes mellitus who is into the emergency room on 1/24 with confusion and fall and had been on the floor for 3 days prior to being found.  In the emergency room, patient found to have sepsis secondary to Enterobacter UTI as well as elevated.  Admitted to the hospitalist service and started on IV cefepime.  Troponins continue to trend upward and patient felt to have a non-STEMI.  Initial plan was for cardiac catheterization, but was delayed due to patient having some acute hospital delirium, likely in the setting of underlying dementia.  On 1/27, patient testing positive for COVID.  2/1, patient more confused, with decreased level of responsiveness.  Patient's hemoglobin down to 6.8 (was 8.3 the day prior) with creatinine at 1.5, previously normal.  White blood cell count elevated, but infection workup negative.  Patient ordered 1 unit packed red blood cells.  Heparin stopped at 8 AM.  2/2: Patient about the same, now with facial droop.  Stat CT scan of head done this morning noting 2 areas of low-density in right lower occipital temporal lobe and lower right frontal lobe concerning for acute/subacute infarcts.  In addition, despite 1 unit packed red blood cell transfusion on previous day, hemoglobin down to 6.5 and creatinine up further to 2.3.  Patient ordered 2 more units packed red blood cells.  By late morning, patient developed rapid atrial fibrillation and started on Cardizem drip and transferred to stepdown unit.  GI consulted, recommending conservative measures.  2/3: MRI done night prior confirms multiple areas of embolic CVA.  Neurology feels likely occurred secondary to atrial fibs despite heparin infusion.  Atrial fibrillation converted back to  normal sinus rhythm.  CT of abdomen/pelvis 2/3 notes small intramuscular hematoma within the left pelvic sidewall and small amount of right retroperitoneal hemorrhage with left retroperitoneal hematoma approximately 940 cc of blood.  Vascular surgery recommended consulting interventional radiology for possible intervention.  2/4: NG tube placed and tube feeds being initiated.  Patient with little change in mentation.  Renal function continues to improve although hemoglobin slightly lower.  Assessment and Plan: Acute embolic CVA: Multi embolic.  Neurology consulted.  Curiously, patient's delirium started in the early morning hours of 2/1, prior to heparin being discontinued.  It is unclear if CVAs occurred after heparin discontinued or during.  Lower extremity Dopplers negative for DVT.  Given retroperitoneal bleed, not a candidate for generalized anticoagulation.  Now with elevated blood pressures from CVA, allowing for permissive hypertension.  Sepsis due to gram-negative UTI (HCC) with enterobacter aerogenes-resolved Treated with IV fluids and completed 5-day cefepime course.  White blood cell count increased back up, secondary to stress margination from CVA, trending back downward.   Rhabdomyolysis-resolved Treated with IV fluids.   NSTEMI/Elevated troponin I level - IV heparin drip with anti-coagulation monitoring by pharmacy Catheterization planned.  Initially held due to patient's acute delirium which is much improved.  Then noted to have fever and workup revealed COVID.  Cardiac cath once mentation and hemoglobin stabilized.  Troponins mildly elevated, consistent with demand ischemia from blood loss.   Paroxysmal atrial fibrillation with RVR (HCC) -Heparin discontinued due to concerns for bleeding. Started having much more rapid ventricular rate on 2/2 and placed on Cardizem drip plus scheduled IV Lopressor.  By morning of 2/3, had converted back to normal sinus rhythm and Cardizem and  Lopressor were stopped.  Not a candidate for further generalized anticoagulation given retroperitoneal bleed.   Dyslipidemia - Atorvastatin stopped due to rising LFTs (08/06/2022)   Type 2 diabetes mellitus without complications (HCC) - Novolog sliding scale tid and bedtime    Essential hypertension - Lopressor as above  - Isordil 20 mg PO tid  - Lisinopril held due to concerns for renal failure and hypotension  Retroperitoneal bleed Possibly from initial fall prior to hospitalization and then being started on heparin during hospitalization.  Status post 3 units packed red blood cells.  Hemoglobin this morning at 10.6.  Continue to monitor.  Vascular surgery consulted recommending interventional radiology consultation.  IR consult pending.  Hemoglobin slightly lower today (8.7, down from 10.6), but in the setting of elevated blood pressures and improving creatinine, previous hemoglobin likely from hemoconcentration from previous blood loss.   COVID 19 infection Stable, not requiring medication at this time.  Not hypoxic  Senile dementia with acute behavioral disturbance and acute cognitive delirium Initial episode secondary to COVID and hospital delirium and now with multi embolic CVA.   It remains to be seen if patient will improve from mentation standpoint.  NG tube has been placed and nutrition consulted for tube feeds.  Overweight: Meets criteria BMI greater than 25  Hypernatremia: Secondary to decreased p.o. intake.  Will add some free water flush through his NG tube.   DVT prophylaxis: IV heparin drip as above  GI prophylaxis: Protonix 20 mg PO daily   Antibiotics: Complete 5-day course of cefepime  Consultants: -Cardiology -Gastroenterology -Neurology -Discussed with critical care Vascular surgery  Procedures: -Echocardiogram -Cardiac cath pending-TBD if will be done.     Subjective: Patient remains confused, moans  Physical Exam: Vitals:   08/11/22 1100  08/11/22 1200 08/11/22 1300 08/11/22 1400  BP: (!) 156/64 (!) 179/70  (!) 198/68  Pulse: 70 73 89 79  Resp: (!) 26 (!) 23 (!) 24 (!) 22  Temp:      TempSrc:      SpO2: 95% 98% 98% 96%  Weight:      Height:       HEENT: Normocephalic, atraumatic, mucous membranes are slightly dry, right facial droop. Cardiovascular: regular rate and rhythm, S1-S2 Pulmonary: Clear to auscultation bilaterally, poor inspiratory effort Abdominal:  Soft, nondistended, hypoactive bowel sounds Musculoskeletal:  No clubbing or cyanosis, trace pitting edema Skin: No skin breaks, tears or lesions Neurological: Limited exam due to compliance.  Facial droop is mild.  Unable to participate in finger-to-nose, strength exam Psychiatric: Noun does not follow commands, may occasionally open his eyes.  Seems to respond to voice  Data Reviewed: Hemoglobin slightly down to 8.7.  White blood cell count and creatinine improving.  Sodium at 147.  Disposition: Status is: Inpatient Prior to discharge needs -Improvement in mentation -Further evaluation of embolic CVA -Cardiac cath -Disposition determination once stabilized -Determination of any intervention for retroperitoneal bleed      Author: Annita Brod, MD 08/11/2022 3:21 PM  For on call review [email or web address].

## 2022-08-12 DIAGNOSIS — I214 Non-ST elevation (NSTEMI) myocardial infarction: Secondary | ICD-10-CM | POA: Diagnosis not present

## 2022-08-12 DIAGNOSIS — A419 Sepsis, unspecified organism: Secondary | ICD-10-CM | POA: Diagnosis not present

## 2022-08-12 DIAGNOSIS — I639 Cerebral infarction, unspecified: Secondary | ICD-10-CM | POA: Diagnosis not present

## 2022-08-12 DIAGNOSIS — I4891 Unspecified atrial fibrillation: Secondary | ICD-10-CM | POA: Diagnosis not present

## 2022-08-12 LAB — GLUCOSE, CAPILLARY
Glucose-Capillary: 123 mg/dL — ABNORMAL HIGH (ref 70–99)
Glucose-Capillary: 161 mg/dL — ABNORMAL HIGH (ref 70–99)
Glucose-Capillary: 185 mg/dL — ABNORMAL HIGH (ref 70–99)
Glucose-Capillary: 196 mg/dL — ABNORMAL HIGH (ref 70–99)
Glucose-Capillary: 202 mg/dL — ABNORMAL HIGH (ref 70–99)
Glucose-Capillary: 254 mg/dL — ABNORMAL HIGH (ref 70–99)
Glucose-Capillary: 259 mg/dL — ABNORMAL HIGH (ref 70–99)

## 2022-08-12 LAB — BASIC METABOLIC PANEL
Anion gap: 11 (ref 5–15)
BUN: 33 mg/dL — ABNORMAL HIGH (ref 8–23)
CO2: 21 mmol/L — ABNORMAL LOW (ref 22–32)
Calcium: 8.3 mg/dL — ABNORMAL LOW (ref 8.9–10.3)
Chloride: 113 mmol/L — ABNORMAL HIGH (ref 98–111)
Creatinine, Ser: 1.2 mg/dL (ref 0.61–1.24)
GFR, Estimated: >60 mL/min (ref >60)
Glucose, Bld: 165 mg/dL — ABNORMAL HIGH (ref 70–99)
Potassium: 3.5 mmol/L (ref 3.5–5.1)
Sodium: 145 mmol/L (ref 135–145)

## 2022-08-12 LAB — CBC
HCT: 28.7 % — ABNORMAL LOW (ref 39.0–52.0)
Hemoglobin: 9.4 g/dL — ABNORMAL LOW (ref 13.0–17.0)
MCH: 30.5 pg (ref 26.0–34.0)
MCHC: 32.8 g/dL (ref 30.0–36.0)
MCV: 93.2 fL (ref 80.0–100.0)
Platelets: 277 10*3/uL (ref 150–400)
RBC: 3.08 MIL/uL — ABNORMAL LOW (ref 4.22–5.81)
RDW: 13.9 % (ref 11.5–15.5)
WBC: 11.8 10*3/uL — ABNORMAL HIGH (ref 4.0–10.5)
nRBC: 0.2 % (ref 0.0–0.2)

## 2022-08-12 LAB — MAGNESIUM
Magnesium: 2.1 mg/dL (ref 1.7–2.4)
Magnesium: 2 mg/dL (ref 1.7–2.4)

## 2022-08-12 LAB — PHOSPHORUS
Phosphorus: 2.4 mg/dL — ABNORMAL LOW (ref 2.5–4.6)
Phosphorus: 2.2 mg/dL — ABNORMAL LOW (ref 2.5–4.6)

## 2022-08-12 LAB — OCCULT BLOOD X 1 CARD TO LAB, STOOL: Fecal Occult Bld: NEGATIVE

## 2022-08-12 LAB — TROPONIN I (HIGH SENSITIVITY): Troponin I (High Sensitivity): 91 ng/L — ABNORMAL HIGH (ref <18)

## 2022-08-12 LAB — BRAIN NATRIURETIC PEPTIDE: B Natriuretic Peptide: 899.1 pg/mL — ABNORMAL HIGH (ref 0.0–100.0)

## 2022-08-12 MED ORDER — ALBUMIN HUMAN 25 % IV SOLN
12.5000 g | Freq: Once | INTRAVENOUS | Status: AC
  Administered 2022-08-12: 12.5 g via INTRAVENOUS
  Filled 2022-08-12: qty 50

## 2022-08-12 MED ORDER — MORPHINE SULFATE (PF) 2 MG/ML IV SOLN
2.0000 mg | INTRAVENOUS | Status: DC | PRN
  Administered 2022-08-12: 2 mg via INTRAVENOUS
  Filled 2022-08-12: qty 1

## 2022-08-12 MED ORDER — FUROSEMIDE 10 MG/ML IJ SOLN
20.0000 mg | Freq: Two times a day (BID) | INTRAMUSCULAR | Status: DC
  Administered 2022-08-12 – 2022-08-15 (×8): 20 mg via INTRAVENOUS
  Filled 2022-08-12 (×7): qty 2

## 2022-08-12 MED ORDER — MORPHINE SULFATE (PF) 2 MG/ML IV SOLN
2.0000 mg | Freq: Once | INTRAVENOUS | Status: AC
  Administered 2022-08-12: 2 mg via INTRAVENOUS
  Filled 2022-08-12: qty 1

## 2022-08-12 NOTE — Progress Notes (Addendum)
  [facility] paged, regarding the need for pain medication. All PRN doses have been administered without relief.

## 2022-08-12 NOTE — Progress Notes (Signed)
Progress Note   Patient: Russell Byrd DOB: 26-Mar-1946 DOA: 08/02/2022     12 DOS: the patient was seen and examined on 08/12/2022   Brief hospital course: 77 year old male with past medical history of paroxysmal atrial fibrillation, diabetes mellitus who is into the emergency room on 1/24 with confusion and fall and had been on the floor for 3 days prior to being found.  In the emergency room, patient found to have sepsis secondary to Enterobacter UTI as well as elevated.  Admitted to the hospitalist service and started on IV cefepime.  Troponins continue to trend upward and patient felt to have a non-STEMI.  Initial plan was for cardiac catheterization, but was delayed due to patient having some acute hospital delirium, likely in the setting of underlying dementia.  On 1/27, patient testing positive for COVID.  2/1, patient more confused, with decreased level of responsiveness.  Patient's hemoglobin down to 6.8 (was 8.3 the day prior) with creatinine at 1.5, previously normal.  White blood cell count elevated, but infection workup negative.  Patient ordered 1 unit packed red blood cells.  Heparin stopped at 8 AM.  2/2: Patient about the same, now with facial droop.  Stat CT scan of head done this morning noting 2 areas of low-density in right lower occipital temporal lobe and lower right frontal lobe concerning for acute/subacute infarcts.  In addition, despite 1 unit packed red blood cell transfusion on previous day, hemoglobin down to 6.5 and creatinine up further to 2.3.  Patient ordered 2 more units packed red blood cells.  By late morning, patient developed rapid atrial fibrillation and started on Cardizem drip and transferred to stepdown unit.  GI consulted, recommending conservative measures.  2/3: MRI done night prior confirms multiple areas of embolic CVA.  Neurology feels likely occurred secondary to atrial fibs despite heparin infusion.  Atrial fibrillation converted back to  normal sinus rhythm.  CT of abdomen/pelvis 2/3 notes small intramuscular hematoma within the left pelvic sidewall and small amount of right retroperitoneal hemorrhage with left retroperitoneal hematoma approximately 940 cc of blood.  Vascular surgery recommended consulting interventional radiology for possible intervention.  2/4: NG tube placed and tube feeds being initiated.  Patient with little change in mentation.  Renal function continues to improve although hemoglobin slightly lower.  2/5: Renal function and white blood cell count almost fully normalized.  Hemoglobin trending upward.  Patient seen by interventional radiology who recommended no intervention at this time.  Patient's BNP trending upward, at 900 today.  Restarted on Lasix with IV albumin added.  Mentation unchanged.  Assessment and Plan: Acute embolic CVA: Multi embolic.  Neurology consulted.  Curiously, patient's delirium started in the early morning hours of 2/1, prior to heparin being discontinued.  It is unclear if CVAs occurred after heparin discontinued or during.  Lower extremity Dopplers negative for DVT.  Given retroperitoneal bleed, not a candidate for generalized anticoagulation.  Now with elevated blood pressures from CVA, allowing for permissive hypertension.  Sepsis due to gram-negative UTI (HCC) with enterobacter aerogenes-resolved Treated with IV fluids and completed 5-day cefepime course.  White blood cell count increased back up, secondary to stress margination from CVA, trending back downward.   Rhabdomyolysis-resolved Treated with IV fluids.   NSTEMI/Elevated troponin I level - IV heparin drip with anti-coagulation monitoring by pharmacy Catheterization planned.  Initially held due to patient's acute delirium which is much improved.  Then noted to have fever and workup revealed COVID.  Cardiac cath once mentation  and hemoglobin stabilized.  Troponins mildly re-elevated, consistent with demand ischemia from  blood loss, but have since come back down following volume resuscitation.   Paroxysmal atrial fibrillation with RVR (HCC)-currently in normal sinus rhythm -Heparin discontinued due to concerns for bleeding. Started having much more rapid ventricular rate on 2/2 and placed on Cardizem drip plus scheduled IV Lopressor.  By morning of 2/3, had converted back to normal sinus rhythm and Cardizem and Lopressor were stopped.  Not a candidate for further generalized anticoagulation given retroperitoneal bleed.   Dyslipidemia - Atorvastatin stopped due to rising LFTs (08/06/2022)   Type 2 diabetes mellitus without complications (HCC) -Sliding scale every 4 hours   Essential hypertension - Lopressor as above  - Isordil 20 mg PO tid  - Lisinopril held due to concerns for renal failure and hypotension  Retroperitoneal bleed Possibly from initial fall prior to hospitalization and then being started on heparin during hospitalization.  Status post 3 units packed red blood cells.  Hemoglobin this morning at 10.6.  Continue to monitor.  Vascular surgery consulted recommending interventional radiology consultation.  IR evaluated patient and given fact that patient's hemoglobin appears to have improved with stable vital signs, recommended no intervention at this time given that intervention would include CT angiogram and contrast dye could impair his kidneys again.  In addition, aspiration of existing blood will be contraindicated due to invasive and infection risk plus blood itself acting like tamponade.  Hemoglobin trending upward along with improvement in creatinine.   COVID 19 infection Stable, not requiring medication at this time.  Not hypoxic.  Can stop COVID restrictions on 2/6.  Senile dementia with acute behavioral disturbance and acute cognitive delirium Initial episode secondary to COVID and hospital delirium and now with multi embolic CVA.   It remains to be seen if patient will improve from  mentation standpoint.  NG tube has been placed and nutrition consulted for tube feeds.  Overweight: Meets criteria BMI greater than 25  Hypernatremia-resolved: Secondary to decreased p.o. intake.  Proved with adding some free water flush through his NG tube.   DVT prophylaxis: IV heparin drip as above  GI prophylaxis: Protonix 20 mg PO daily   Antibiotics: Complete 5-day course of cefepime  Consultants: -Cardiology -Gastroenterology -Neurology -Discussed with critical care Vascular surgery -Interventional radiology  Procedures: -Echocardiogram -Cardiac cath pending-TBD if will be done.     Subjective: Mentation does not change.  Responds to voice, but does not open eyes.  Physical Exam: Vitals:   08/12/22 0700 08/12/22 0800 08/12/22 0900 08/12/22 1000  BP: (!) 183/83 (!) 156/116 (!) 188/82 (!) 185/67  Pulse: 79 95 93 96  Resp: 18 15 (!) 24 16  Temp:  98.8 F (37.1 C)    TempSrc:  Axillary    SpO2: 98% 98% 100% 99%  Weight:      Height:       HEENT: Normocephalic, atraumatic, mucous membranes are slightly dry, right facial droop. Cardiovascular: regular rate and rhythm, S1-S2 Pulmonary: Clear to auscultation bilaterally, poor inspiratory effort Abdominal:  Soft, nondistended, hypoactive bowel sounds Musculoskeletal:  No clubbing or cyanosis, trace pitting edema Skin: No skin breaks, tears or lesions Neurological: Limited exam due to compliance.  Facial droop is mild.  Unable to participate in finger-to-nose, strength exam Psychiatric: Noun does not follow commands, may occasionally open his eyes.  Seems to respond to voice  Data Reviewed: Sodium at 145.  BNP at 899.  Troponin down to 91.  Creatinine down to 1.2.  Hemoglobin improved to 9.4.  White blood cell count of 11.8.  Disposition: Status is: Inpatient Prior to discharge needs -Following mentation for possible improvement -? Cardiac cath -Will need skilled nursing      Author: Annita Brod,  MD 08/12/2022 11:49 AM  For on call review [email or web address].

## 2022-08-12 NOTE — Progress Notes (Signed)
Interventional Radiology Progress Note  77 yo male admit for sepsis via ED 08/01/22, with subsequent NSTEMI, and also left retroperitoneal bleed with acute blood loss anemia.   VIR contacted regarding impression on the retroperitoneal hemorrhage.   Image reviewed, chart/labs reviewed, events and vitals reviewed.    Discussed with Dr. Maryland Pink.    Natural history of retroperitoneal hemorrhage after discontinuation of anti-coagulation/anti-platelets is cessation.  Evidence that this hemorrhage is ceased include his stabilizing H&H (after transfusion), and normal vital signs.    Currently there is indefinite hold for left cardiac cath/possible intervention. Should he receive stents, there would be indication for DAPT and his risk of bleeding would be higher.    Aggressive management of retroperitoneal hemorrhage would include CTA with ~100cc contrast dose, and then any possible intervention that might be indicated, including angio with additional contrast.   Currently his creatine is recovering after acute renal insufficiency.    At this point I do not see an advantage to aggressive management of the RP, as likely has stopped.    VIR is available to reassess should he have any change in status, or with questions/concerns.   Signed,  Dulcy Fanny. Earleen Newport, DO

## 2022-08-12 NOTE — Progress Notes (Addendum)
Physical Therapy Treatment Patient Details Name: Russell Byrd MRN: [id number] DOB: [birth year] Today's Date: 08/12/2022   History of Present Illness Patient is a 77 year old male with NSTEMI and atrial fibrillation with rapid ventricular response in the setting of altered mental status after lying on the floor for 3 days. Complicated by [id number] infection with cardiac catheterization on hold pending improvement in infection. Stay complicated by acute CVA in R PCA and noted cognition change. Re-evaluation performed 08/11/22.    PT Comments    Patient has eyes closed throughout session. He appears restless and occasionally moans. ROM provided to extremities with encouragement to participate. He does not actively participate and is resistive at times, especially with the RUE. The patient does withdrawal to noxious stimulation to nail bed BLE. Elevated head of bed from 30 to 45 degrees for stimulation in attempts to increase alertness/participation without success. The patient does not follow any commands. The patient does not turn his head to loud noise and has eyes closed throughout. Total assistance for repositioning for skin integrity. No progress made since last visit. Will continue to follow on a trial basis.    Recommendations for follow up therapy are one component of a multi-disciplinary discharge planning process, led by the attending physician.  Recommendations may be updated based on patient status, additional functional criteria and insurance authorization.  Follow Up Recommendations  Skilled nursing-short term rehab (<3 hours/day) Can patient physically be transported by private vehicle: No   Assistance Recommended at Discharge Frequent or constant Supervision/Assistance  Patient can return home with the following Two people to help with walking and/or transfers;Two people to help with bathing/dressing/bathroom   Equipment Recommendations  None recommended by PT    Recommendations  for Other Services       Precautions / Restrictions Precautions Precautions: Fall Restrictions Weight Bearing Restrictions: No     Mobility  Bed Mobility Overal bed mobility: Needs Assistance Bed Mobility: Rolling Rolling: Total assist         General bed mobility comments: Head of bed elevated from 30 to 45 degrees for stimulation in attemt to increased alertness/arousal. The patient appears restless but does not open eyes or participate/follow commands. Total assistance for repositioning to the right side in bed.    Transfers                        Ambulation/Gait                   Stairs             Wheelchair Mobility    Modified Rankin (Stroke Patients Only)       Balance                                            Cognition Arousal/Alertness: Lethargic Behavior During Therapy: Restless Overall Cognitive Status: Impaired/Different from baseline                                 General Comments: patient is unable to follow commands this date. eyes closed throughout session. with manual eye lid opening, patient does appear to move eyes towards voice        Exercises General Exercises - Upper Extremity Elbow Flexion: PROM, Both, 5 reps Elbow Extension: PROM, Both,  5 reps General Exercises - Lower Extremity Ankle Circles/Pumps: PROM, Strengthening, Both, 10 reps, Supine Heel Slides: PROM, Strengthening, Both, 10 reps, Supine Hip ABduction/ADduction: PROM, Strengthening, Both, 10 reps, Supine Straight Leg Raises: PROM, Strengthening, Both, 10 reps, Supine Other Exercises Other Exercises: verbal and tactile cues for participation with extremity movement. The patient provied no active movement and is resistive at times, especailly with any RUE movement.    General Comments General comments (skin integrity, edema, etc.): no significant change in heart rate with mobility      Pertinent Vitals/Pain  Pain Assessment Pain Assessment: CPOT Facial Expression: Tense Body Movements: Protection Muscle Tension: Very tense or rigid Compliance with ventilator (intubated pts.): N/A Vocalization (extubated pts.): Sighing, moaning CPOT Total: 5 Pain Intervention(s): Limited activity within patient's tolerance, Monitored during session    Home Living                          Prior Function            PT Goals (current goals can now be found in the care plan section) Acute Rehab PT Goals Patient Stated Goal: none stated PT Goal Formulation: Patient unable to participate in goal setting Time For Goal Achievement: 08/25/22 Potential to Achieve Goals: Poor Progress towards PT goals: Not progressing toward goals - comment    Frequency    Min 2X/week      PT Plan Current plan remains appropriate    Co-evaluation              AM-PAC PT "6 Clicks" Mobility   Outcome Measure  Help needed turning from your back to your side while in a flat bed without using bedrails?: Total Help needed moving from lying on your back to sitting on the side of a flat bed without using bedrails?: Total Help needed moving to and from a bed to a chair (including a wheelchair)?: Total Help needed standing up from a chair using your arms (e.g., wheelchair or bedside chair)?: Total Help needed to walk in hospital room?: Total Help needed climbing 3-5 steps with a railing? : Total 6 Click Score: 6    End of Session   Activity Tolerance:  (limited cognition) Patient left: in bed;with call bell/phone within reach;with bed alarm set Nurse Communication: Mobility status PT Visit Diagnosis: Unsteadiness on feet (R26.81);Muscle weakness (generalized) (M62.81)     Time: 8315-1761 PT Time Calculation (min) (ACUTE ONLY): 20 min  Charges:  $Therapeutic Activity: 8-22 mins                     Minna Merritts, PT, MPT    Percell Locus 08/12/2022, 12:12 PM

## 2022-08-12 NOTE — Progress Notes (Signed)
SLP Cancellation Note  Patient Details Name: NEIMAN ROOTS MRN: [id number] DOB: 1945-11-02   Cancelled treatment:       Reason Eval/Treat Not Completed: Medical issues which prohibited therapy;Patient not medically ready   Per RN, pt unable to meaningfully participate in cognitive-linguistic evaluation at this time. Will continue efforts as appropriate.  Cherrie Gauze, M.S., [facility] [phone] [place])  Quintella Baton 08/12/2022, 10:46 AM

## 2022-08-13 ENCOUNTER — Inpatient Hospital Stay: Payer: PPO

## 2022-08-13 DIAGNOSIS — L8990 Pressure ulcer of unspecified site, unspecified stage: Secondary | ICD-10-CM | POA: Insufficient documentation

## 2022-08-13 DIAGNOSIS — I480 Paroxysmal atrial fibrillation: Secondary | ICD-10-CM | POA: Diagnosis not present

## 2022-08-13 DIAGNOSIS — I639 Cerebral infarction, unspecified: Secondary | ICD-10-CM | POA: Diagnosis not present

## 2022-08-13 DIAGNOSIS — I6349 Cerebral infarction due to embolism of other cerebral artery: Secondary | ICD-10-CM

## 2022-08-13 DIAGNOSIS — R58 Hemorrhage, not elsewhere classified: Secondary | ICD-10-CM

## 2022-08-13 DIAGNOSIS — U071 COVID-19: Secondary | ICD-10-CM

## 2022-08-13 DIAGNOSIS — T796XXS Traumatic ischemia of muscle, sequela: Secondary | ICD-10-CM

## 2022-08-13 DIAGNOSIS — I4891 Unspecified atrial fibrillation: Secondary | ICD-10-CM | POA: Diagnosis not present

## 2022-08-13 DIAGNOSIS — I214 Non-ST elevation (NSTEMI) myocardial infarction: Secondary | ICD-10-CM | POA: Diagnosis not present

## 2022-08-13 LAB — GLUCOSE, CAPILLARY
Glucose-Capillary: 232 mg/dL — ABNORMAL HIGH (ref 70–99)
Glucose-Capillary: 270 mg/dL — ABNORMAL HIGH (ref 70–99)
Glucose-Capillary: 233 mg/dL — ABNORMAL HIGH (ref 70–99)
Glucose-Capillary: 197 mg/dL — ABNORMAL HIGH (ref 70–99)
Glucose-Capillary: 240 mg/dL — ABNORMAL HIGH (ref 70–99)
Glucose-Capillary: 237 mg/dL — ABNORMAL HIGH (ref 70–99)

## 2022-08-13 LAB — CBC
HCT: 32.7 % — ABNORMAL LOW (ref 39.0–52.0)
Hemoglobin: 10.2 g/dL — ABNORMAL LOW (ref 13.0–17.0)
MCH: 30.4 pg (ref 26.0–34.0)
MCHC: 31.2 g/dL (ref 30.0–36.0)
MCV: 97.6 fL (ref 80.0–100.0)
Platelets: 204 10*3/uL (ref 150–400)
RBC: 3.35 MIL/uL — ABNORMAL LOW (ref 4.22–5.81)
RDW: 14.1 % (ref 11.5–15.5)
WBC: 11.1 10*3/uL — ABNORMAL HIGH (ref 4.0–10.5)
nRBC: 0 % (ref 0.0–0.2)

## 2022-08-13 LAB — BASIC METABOLIC PANEL
Anion gap: 10 (ref 5–15)
BUN: 34 mg/dL — ABNORMAL HIGH (ref 8–23)
CO2: 23 mmol/L (ref 22–32)
Calcium: 8.6 mg/dL — ABNORMAL LOW (ref 8.9–10.3)
Chloride: 111 mmol/L (ref 98–111)
Creatinine, Ser: 1.23 mg/dL (ref 0.61–1.24)
GFR, Estimated: >60 mL/min (ref >60)
Glucose, Bld: 271 mg/dL — ABNORMAL HIGH (ref 70–99)
Potassium: 3.6 mmol/L (ref 3.5–5.1)
Sodium: 144 mmol/L (ref 135–145)

## 2022-08-13 LAB — PHOSPHORUS: Phosphorus: 2.8 mg/dL (ref 2.5–4.6)

## 2022-08-13 LAB — MAGNESIUM: Magnesium: 2 mg/dL (ref 1.7–2.4)

## 2022-08-13 MED ORDER — JUVEN PO PACK
1.0000 | PACK | Freq: Two times a day (BID) | ORAL | Status: DC
  Administered 2022-08-13 – 2022-08-17 (×9): 1

## 2022-08-13 MED ORDER — POLYETHYLENE GLYCOL 3350 17 G PO PACK
17.0000 g | PACK | Freq: Every day | ORAL | Status: DC
  Administered 2022-08-13 – 2022-08-17 (×5): 17 g
  Filled 2022-08-13 (×6): qty 1

## 2022-08-13 MED ORDER — GLUCERNA 1.5 CAL PO LIQD
1000.0000 mL | ORAL | Status: DC
  Administered 2022-08-13 – 2022-08-15 (×3): 1000 mL

## 2022-08-13 NOTE — Assessment & Plan Note (Signed)
Anticoagulation on hold.  Hemoglobin better than 2 days ago up at 10.2.

## 2022-08-13 NOTE — Assessment & Plan Note (Signed)
Patient's mental status will have to be better prior to any procedure.

## 2022-08-13 NOTE — Inpatient Diabetes Management (Signed)
Inpatient Diabetes Program Recommendations  AACE/ADA: New Consensus Statement on Inpatient Glycemic Control  Target Ranges:  Prepandial:   less than 140 mg/dL      Peak postprandial:   less than 180 mg/dL (1-2 hours)      Critically ill patients:  140 - 180 mg/dL    Latest Reference Range & Units 08/13/22 03:38 08/13/22 09:13 08/13/22 12:07  Glucose-Capillary 70 - 99 mg/dL 232 (H) 270 (H) 233 (H)    Latest Reference Range & Units 08/12/22 07:15 08/12/22 13:23 08/12/22 17:11 08/12/22 19:18 08/12/22 22:39  Glucose-Capillary 70 - 99 mg/dL 185 (H) 259 (H) 202 (H) 196 (H) 254 (H)   Review of Glycemic Control  Diabetes history: DM2 Outpatient Diabetes medications: Metformin 1000 mg BID Current orders for Inpatient glycemic control: Novolog 0-9 units Q4H; Osmolite @ 55 ml/hr  Inpatient Diabetes Program Recommendations:    Insulin: Please consider ordering Novolog 3 units Q4H for tube feeding coverage. If tube feeding is stopped or held then Novolog tube feeding coverage should also be stopped or held.  Thanks, Barnie Alderman, RN, MSN, [place] [facility] Coordinator Inpatient Diabetes Program [phone] (Team Pager from 8am to [place])

## 2022-08-13 NOTE — Assessment & Plan Note (Signed)
Multiple embolic strokes seen on MRI of the brain.  Seen by neurology.  Currently unable to give anticoagulation with retroperitoneal bleed.  Supportive care.

## 2022-08-13 NOTE — Assessment & Plan Note (Addendum)
Acute delirium.  Continue to monitor.  Spoke with patient's daughter that everything will key off the mental status.  Palliative care consultation to discuss goals of care.  Continue tube feedings.

## 2022-08-13 NOTE — Assessment & Plan Note (Signed)
Supportive care.  Should be able to come out of COVID isolation tomorrow.

## 2022-08-13 NOTE — Hospital Course (Signed)
77 year old male with past medical history of paroxysmal atrial fibrillation, diabetes mellitus who is into the emergency room on 1/24 with confusion and fall and had been on the floor for 3 days prior to being found.  In the emergency room, patient found to have sepsis secondary to Enterobacter UTI as well as elevated.  Admitted to the hospitalist service and started on IV cefepime.  Troponins continue to trend upward and patient felt to have a non-STEMI.  Initial plan was for cardiac catheterization, but was delayed due to patient having some acute hospital delirium, likely in the setting of underlying dementia.  On 1/27, patient testing positive for COVID.   2/1, patient more confused, with decreased level of responsiveness.  Patient's hemoglobin down to 6.8 (was 8.3 the day prior) with creatinine at 1.5, previously normal.  White blood cell count elevated, but infection workup negative.  Patient ordered 1 unit packed red blood cells.  Heparin stopped at 8 AM.   2/2: Patient about the same, now with facial droop.  Stat CT scan of head done this morning noting 2 areas of low-density in right lower occipital temporal lobe and lower right frontal lobe concerning for acute/subacute infarcts.  In addition, despite 1 unit packed red blood cell transfusion on previous day, hemoglobin down to 6.5 and creatinine up further to 2.3.  Patient ordered 2 more units packed red blood cells.  By late morning, patient developed rapid atrial fibrillation and started on Cardizem drip and transferred to stepdown unit.  GI consulted, recommending conservative measures.  2/3: MRI done night prior confirms multiple areas of embolic CVA.  Neurology feels likely occurred secondary to atrial fibs despite heparin infusion.  Atrial fibrillation converted back to normal sinus rhythm.  CT of abdomen/pelvis 2/3 notes small intramuscular hematoma within the left pelvic sidewall and small amount of right retroperitoneal hemorrhage with left  retroperitoneal hematoma approximately 940 cc of blood.  Vascular surgery recommended consulting interventional radiology for possible intervention.   2/4: NG tube placed and tube feeds being initiated.  Patient with little change in mentation.  Renal function continues to improve although hemoglobin slightly lower.   2/5: Renal function and white blood cell count almost fully normalized.  Hemoglobin trending upward.  Patient seen by interventional radiology who recommended no intervention at this time.  Patient's BNP trending upward, at 900 today.  Restarted on Lasix with IV albumin added.  Mentation unchanged.  2/6.  Patient able to lift up his arms off the bed.  Weaker with his legs.  Unable to talk.  Advancing gastric tube.  Trying to get tube feeds up to goal.  Palliative care consultation.

## 2022-08-13 NOTE — Assessment & Plan Note (Signed)
Coccyx stage II and anterior thigh stage II.  See full description below.  Local wound care, present on admission

## 2022-08-13 NOTE — Progress Notes (Signed)
Subjective: Slightly more interactive today. Became teary eyed when daughter first entered the room during her visit today. Smiled when he was told that his significant other had arrived  Objective: Current vital signs: BP (!) 148/61   Pulse 81   Temp 98.4 F (36.9 C) (Axillary)   Resp (!) 30   Ht '5\' 7"'$  (1.702 m)   Wt 74.4 kg   SpO2 97%   BMI 25.69 kg/m  Vital signs in last 24 hours: Temp:  [98.4 F (36.9 C)-99.5 F (37.5 C)] 98.4 F (36.9 C) (02/06 0400) Pulse Rate:  [70-95] 81 (02/06 1900) Resp:  [19-30] 30 (02/06 1900) BP: (128-164)/(53-95) 148/61 (02/06 1900) SpO2:  [96 %-100 %] 97 % (02/06 1900)  Intake/Output from previous day: 02/05 0701 - 02/06 0700 In: 1317.3 [NG/GT:1317.3] Out: 1755 [Urine:1755] Intake/Output this shift: No intake/output data recorded. Nutritional status:  Diet Order             Diet NPO time specified  Diet effective now                  General Exam: HEENT: [place]/AT. NGT in place Lungs: Respirations unlabored Ext: No edema  Neurological Exam: Ment: Eyes are closed and do not open to command. Did squeeze examiner's hand to command with his right hand but did not let go when asked to release it. Did not follow any other commands including to grip with his left hand. No verbal output.  CN: Pupils are equal. No blink to threat. 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 No definite left facial droop seen today (improved since Sunday's exam).   Motor & sensory: Spontaneously moves right arm/hand towards face and NGT and also moves to stimulation. Moves LUE slightly to stimulation. Withdraws BLE to noxious plantar stimulation without gross asymmetry.   Reflexes: 2+ bilateral patellars Coordination/Gait: Unable to assess  Lab Results: Results for orders placed or performed during the hospital encounter of 08/07/2022 (from the past 48 hour(s))  Glucose, capillary     Status:  None   Collection Time: 08/11/22  7:56 PM  Result Value Ref Range   Glucose-Capillary 75 70 - 99 mg/dL    Comment: Glucose reference range applies only to samples taken after fasting for at least 8 hours.  Glucose, capillary     Status: Abnormal   Collection Time: 08/12/22 12:26 AM  Result Value Ref Range   Glucose-Capillary 123 (H) 70 - 99 mg/dL    Comment: Glucose reference range applies only to samples taken after fasting for at least 8 hours.  Glucose, capillary     Status: Abnormal   Collection Time: 08/12/22  3:20 AM  Result Value Ref Range   Glucose-Capillary 161 (H) 70 - 99 mg/dL    Comment: Glucose reference range applies only to samples taken after fasting for at least 8 hours.  Brain natriuretic peptide     Status: Abnormal   Collection Time: 08/12/22  3:22 AM  Result Value Ref Range   B Natriuretic Peptide 899.1 (H) 0.0 - 100.0 pg/mL    Comment: Performed at [facility], [address] [place], [place] [address]  CBC     Status: Abnormal   Collection Time: 08/12/22  3:22 AM  Result Value Ref Range   WBC 11.8 (H) 4.0 - 10.5 K/uL   RBC 3.08 (L) 4.22 - 5.81 MIL/uL   Hemoglobin 9.4 (L) 13.0 - 17.0 g/dL  HCT 28.7 (L) 39.0 - 52.0 %   MCV 93.2 80.0 - 100.0 fL   MCH 30.5 26.0 - 34.0 pg   MCHC 32.8 30.0 - 36.0 g/dL   RDW 13.9 11.5 - 15.5 %   Platelets 277 150 - 400 K/uL   nRBC 0.2 0.0 - 0.2 %    Comment: Performed at [facility], [address], [place], [place] [address]  Basic metabolic panel     Status: Abnormal   Collection Time: 08/12/22  3:22 AM  Result Value Ref Range   Sodium 145 135 - 145 mmol/L   Potassium 3.5 3.5 - 5.1 mmol/L   Chloride 113 (H) 98 - 111 mmol/L   CO2 21 (L) 22 - 32 mmol/L   Glucose, Bld 165 (H) 70 - 99 mg/dL    Comment: Glucose reference range applies only to samples taken after fasting for at least 8 hours.   BUN 33 (H) 8 - 23 mg/dL   Creatinine, Ser 1.20 0.61 - 1.24 mg/dL   Calcium 8.3 (L) 8.9 - 10.3 mg/dL   GFR,  Estimated >60 >60 mL/min    Comment: (NOTE) Calculated using the CKD-EPI Creatinine Equation (2021)    Anion gap 11 5 - 15    Comment: Performed at [facility], [address] [place], [place] [address]  Magnesium     Status: None   Collection Time: 08/12/22  3:22 AM  Result Value Ref Range   Magnesium 2.1 1.7 - 2.4 mg/dL    Comment: Performed at [facility], [address], [place], [place] [address]  Phosphorus     Status: Abnormal   Collection Time: 08/12/22  3:22 AM  Result Value Ref Range   Phosphorus 2.4 (L) 2.5 - 4.6 mg/dL    Comment: Performed at [facility], [place], [place] [address]  Troponin I (High Sensitivity)     Status: Abnormal   Collection Time: 08/12/22  3:22 AM  Result Value Ref Range   Troponin I (High Sensitivity) 91 (H) <18 ng/L    Comment: (NOTE) Elevated high sensitivity troponin I (hsTnI) values and significant  changes across serial measurements may suggest ACS but many other  chronic and acute conditions are known to elevate hsTnI results.  Refer to the "Links" section for chest pain algorithms and additional  guidance. Performed at [facility], [address] [place], [place] [address]   Occult blood card to lab, stool     Status: None   Collection Time: 08/12/22  5:00 AM  Result Value Ref Range   Fecal Occult Bld NEGATIVE NEGATIVE    Comment: Performed at [facility], [address] [place], [place] [address]  Glucose, capillary     Status: Abnormal   Collection Time: 08/12/22  7:15 AM  Result Value Ref Range   Glucose-Capillary 185 (H) 70 - 99 mg/dL    Comment: Glucose reference range applies only to samples taken after fasting for at least 8 hours.  Glucose, capillary     Status: Abnormal   Collection Time: 08/12/22  1:23 PM  Result Value Ref Range   Glucose-Capillary 259 (H) 70 - 99 mg/dL    Comment: Glucose reference range applies only to samples taken after fasting  for at least 8 hours.  Magnesium     Status: None   Collection Time: 08/12/22  5:03 PM  Result Value Ref Range   Magnesium 2.0 1.7 - 2.4 mg/dL    Comment:  Performed at [facility], [address] [place], [place] [address]  Phosphorus     Status: Abnormal   Collection Time: 08/12/22  5:03 PM  Result Value Ref Range   Phosphorus 2.2 (L) 2.5 - 4.6 mg/dL    Comment: Performed at [facility], [address] [place], [place] [address]  Glucose, capillary     Status: Abnormal   Collection Time: 08/12/22  5:11 PM  Result Value Ref Range   Glucose-Capillary 202 (H) 70 - 99 mg/dL    Comment: Glucose reference range applies only to samples taken after fasting for at least 8 hours.  Glucose, capillary     Status: Abnormal   Collection Time: 08/12/22  7:18 PM  Result Value Ref Range   Glucose-Capillary 196 (H) 70 - 99 mg/dL    Comment: Glucose reference range applies only to samples taken after fasting for at least 8 hours.  Glucose, capillary     Status: Abnormal   Collection Time: 08/12/22 10:39 PM  Result Value Ref Range   Glucose-Capillary 254 (H) 70 - 99 mg/dL    Comment: Glucose reference range applies only to samples taken after fasting for at least 8 hours.  Glucose, capillary     Status: Abnormal   Collection Time: 08/13/22  3:38 AM  Result Value Ref Range   Glucose-Capillary 232 (H) 70 - 99 mg/dL    Comment: Glucose reference range applies only to samples taken after fasting for at least 8 hours.  CBC     Status: Abnormal   Collection Time: 08/13/22  4:55 AM  Result Value Ref Range   WBC 11.1 (H) 4.0 - 10.5 K/uL   RBC 3.35 (L) 4.22 - 5.81 MIL/uL   Hemoglobin 10.2 (L) 13.0 - 17.0 g/dL   HCT 32.7 (L) 39.0 - 52.0 %   MCV 97.6 80.0 - 100.0 fL   MCH 30.4 26.0 - 34.0 pg   MCHC 31.2 30.0 - 36.0 g/dL   RDW 14.1 11.5 - 15.5 %   Platelets 204 150 - 400 K/uL   nRBC 0.0 0.0 - 0.2 %    Comment: Performed at [facility], [address] [place],  [place] [address]  Basic metabolic panel     Status: Abnormal   Collection Time: 08/13/22  4:55 AM  Result Value Ref Range   Sodium 144 135 - 145 mmol/L   Potassium 3.6 3.5 - 5.1 mmol/L   Chloride 111 98 - 111 mmol/L   CO2 23 22 - 32 mmol/L   Glucose, Bld 271 (H) 70 - 99 mg/dL    Comment: Glucose reference range applies only to samples taken after fasting for at least 8 hours.   BUN 34 (H) 8 - 23 mg/dL   Creatinine, Ser 1.23 0.61 - 1.24 mg/dL   Calcium 8.6 (L) 8.9 - 10.3 mg/dL   GFR, Estimated >60 >60 mL/min    Comment: (NOTE) Calculated using the CKD-EPI Creatinine Equation (2021)    Anion gap 10 5 - 15    Comment: Performed at [facility], [address] [place], [place] [address]  Magnesium     Status: None   Collection Time: 08/13/22  4:55 AM  Result Value Ref Range   Magnesium 2.0 1.7 - 2.4 mg/dL    Comment: Performed at [facility], [address], [place], [place] [address]  Phosphorus     Status: None   Collection Time: 08/13/22  4:55 AM  Result Value Ref Range  Phosphorus 2.8 2.5 - 4.6 mg/dL    Comment: Performed at [facility] [address] [place], [place] [address]  Glucose, capillary     Status: Abnormal   Collection Time: 08/13/22  9:13 AM  Result Value Ref Range   Glucose-Capillary 270 (H) 70 - 99 mg/dL    Comment: Glucose reference range applies only to samples taken after fasting for at least 8 hours.  Glucose, capillary     Status: Abnormal   Collection Time: 08/13/22 12:07 PM  Result Value Ref Range   Glucose-Capillary 233 (H) 70 - 99 mg/dL    Comment: Glucose reference range applies only to samples taken after fasting for at least 8 hours.  Glucose, capillary     Status: Abnormal   Collection Time: 08/13/22  3:23 PM  Result Value Ref Range   Glucose-Capillary 197 (H) 70 - 99 mg/dL    Comment: Glucose reference range applies only to samples taken after fasting for at least 8 hours.    Recent Results (from the past 240  hour(s))  MRSA Next Gen by PCR, Nasal     Status: None   Collection Time: 08/09/22 12:51 PM   Specimen: Nasal Mucosa; Nasal Swab  Result Value Ref Range Status   MRSA by PCR Next Gen NOT DETECTED NOT DETECTED Final    Comment: (NOTE) The GeneXpert MRSA Assay (FDA approved for NASAL specimens only), is one component of a comprehensive MRSA colonization surveillance program. It is not intended to diagnose MRSA infection nor to guide or monitor treatment for MRSA infections. Test performance is not FDA approved in patients less than 75 years old. Performed at [facility], [address] [place], [place] [address]     Lipid Panel No results for input(s): "CHOL", "TRIG", "HDL", "CHOLHDL", "VLDL", "LDLCALC" in the last 72 hours.  Studies/Results: DG Abd Portable 1V  Result Date: 08/13/2022 CLINICAL DATA:  Encounter for nasogastric tube placement EXAM: PORTABLE ABDOMEN - 1 VIEW COMPARISON:  None Available. FINDINGS: NG tube with tip in the stomach. Side port below the GE junction. Nonobstructive bowel gas pattern. IMPRESSION: NG tube in stomach. Electronically Signed   By: Suzy Bouchard M.D.   On: 08/13/2022 13:19    Medications: Scheduled:  sodium chloride   Intravenous Once   vitamin C  500 mg Per Tube BID   Chlorhexidine Gluconate Cloth  6 each Topical Daily   famotidine  20 mg Per Tube Daily   free water  100 mL Per Tube Q6H   furosemide  20 mg Intravenous BID   insulin aspart  0-9 Units Subcutaneous Q4H   isosorbide dinitrate  20 mg Per Tube TID   nutrition supplement (JUVEN)  1 packet Per Tube BID BM   polyethylene glycol  17 g Per Tube Daily   traZODone  25 mg Per Tube QHS   zinc sulfate  220 mg Per Tube Daily   Continuous:  feeding supplement (GLUCERNA 1.5 CAL) 25 mL/hr at 08/13/22 1900   Assessment: 77 yo male presenting with sepsis 2/2 UTI (treated), COVID (+), new dx a fib and NSTEMI on whom neurology is consulted for acute infarcts. He was on heparin gtt  for a fib until 2/1 when his Hgb started to drop and it was placed on hold. 12 hrs prior to that he became confused and then he developed L facial droop on 2/2 prompting a CT head showing multiple infarcts. MRI brain confirmed multifocal acute ischemia, largest infarct in the R PCA  territory, without hemorrhage or mass effect. BLE dopplers were negative. TTE showed no clot on admission but suspect source was cardioembolic from source not seen on TTE. Patient found to have RP bleed preventing administration of antiplatelets at this time. Anticoagulation would be contraindicated in the setting of acute large ischemic infarct and is certainly contraindicated in the setting of RP bleed.  - Exam today (Tuesday) essentially unchanged since Sunday. 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 - Hemoglobin trending upwards. Now 10.2 - TTE completed last week showed no clot - BLE [place] neg for DVT - Comorbidities: NSTEMI, paroxysmal atrial fibrillation, delirium, sepsis, rhabdomyolysis, Covid infection, stage II decubitus ulcer, DM2 and HTN   Recommendations: - Goal normotension, avoid hypotension - Antiplatelets continue to be on hold in setting of RP bleed, restart when able - q4 hr neuro checks - STAT head CT for any change in neuro exam - Telemetry - PT/OT/SLP - Stroke education - Amb referral to Neurology upon discharge - Neurohospitalist service will follow PRN. Please call us if there are additional questions.     LOS: 13 days   '@Electronically'$  signed: Dr. Kerney Elbe 08/13/2022  7:48 PM

## 2022-08-13 NOTE — Progress Notes (Signed)
Progress Note   Patient: Russell Byrd JJH:[id number] DOB: 04/17/1946 DOA: 08/06/2022     13 DOS: the patient was seen and examined on 08/13/2022   Brief hospital course: 77 year old male with past medical history of paroxysmal atrial fibrillation, diabetes mellitus who is into the emergency room on 1/24 with confusion and fall and had been on the floor for 3 days prior to being found.  In the emergency room, patient found to have sepsis secondary to Enterobacter UTI as well as elevated.  Admitted to the hospitalist service and started on IV cefepime.  Troponins continue to trend upward and patient felt to have a non-STEMI.  Initial plan was for cardiac catheterization, but was delayed due to patient having some acute hospital delirium, likely in the setting of underlying dementia.  On 1/27, patient testing positive for COVID.   2/1, patient more confused, with decreased level of responsiveness.  Patient's hemoglobin down to 6.8 (was 8.3 the day prior) with creatinine at 1.5, previously normal.  White blood cell count elevated, but infection workup negative.  Patient ordered 1 unit packed red blood cells.  Heparin stopped at 8 AM.   2/2: Patient about the same, now with facial droop.  Stat CT scan of head done this morning noting 2 areas of low-density in right lower occipital temporal lobe and lower right frontal lobe concerning for acute/subacute infarcts.  In addition, despite 1 unit packed red blood cell transfusion on previous day, hemoglobin down to 6.5 and creatinine up further to 2.3.  Patient ordered 2 more units packed red blood cells.  By late morning, patient developed rapid atrial fibrillation and started on Cardizem drip and transferred to stepdown unit.  GI consulted, recommending conservative measures.  2/3: MRI done night prior confirms multiple areas of embolic CVA.  Neurology feels likely occurred secondary to atrial fibs despite heparin infusion.  Atrial fibrillation converted back  to normal sinus rhythm.  CT of abdomen/pelvis 2/3 notes small intramuscular hematoma within the left pelvic sidewall and small amount of right retroperitoneal hemorrhage with left retroperitoneal hematoma approximately 940 cc of blood.  Vascular surgery recommended consulting interventional radiology for possible intervention.   2/4: NG tube placed and tube feeds being initiated.  Patient with little change in mentation.  Renal function continues to improve although hemoglobin slightly lower.   2/5: Renal function and white blood cell count almost fully normalized.  Hemoglobin trending upward.  Patient seen by interventional radiology who recommended no intervention at this time.  Patient's BNP trending upward, at 900 today.  Restarted on Lasix with IV albumin added.  Mentation unchanged.  2/6.  Patient able to lift up his arms off the bed.  Weaker with his legs.  Unable to talk.  Advancing gastric tube.  Trying to get tube feeds up to goal.  Palliative care consultation.  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  Seen by neurology.  Currently unable to give anticoagulation with retroperitoneal bleed.  Supportive care.  Retroperitoneal bleed Anticoagulation on hold.  Hemoglobin better than 2 days ago up at 10.2.  NSTEMI (non-ST elevated myocardial infarction) ([place]) Patient's mental status will have to be better prior to any procedure.  Paroxysmal atrial fibrillation with RVR (HCC) Unable to give anticoagulation with retroperitoneal bleed.  Delirium Acute delirium.  Continue to monitor.  Spoke with patient's daughter that everything will key off the mental status.  Palliative care consultation to discuss goals of care.  Continue  tube feedings.  Sepsis due to gram-negative UTI (HCC) Received 5 days of cefepime.  Patient had Enterobacter in the urine culture.  Rhabdomyolysis Treated with IV fluids during the hospital course  COVID-19  virus infection Supportive care.  Should be able to come out of COVID isolation tomorrow.  Decubital ulcer Coccyx stage II and anterior thigh stage II.  See full description below.  Local wound care, present on admission  Dyslipidemia Holding off cholesterol medication at this time.  Type 2 diabetes mellitus without complications (HCC) Sliding scale coverage.  Tube feeding changed to Glucerna.  Essential hypertension Patient on isosorbide dinitrate and as needed Lopressor        Subjective: Patient alert and able to lift up his arms.  Weaker with his legs.  Unable to speak.  Admitted 12 days ago with altered mental status.  Found to have embolic strokes and retroperitoneal bleed  Physical Exam: Vitals:   08/13/22 0300 08/13/22 0400 08/13/22 0500 08/13/22 0600  BP:  (!) 164/69    Pulse: 70  89 95  Resp: 20 (!) 27 (!) 22 19  Temp:  98.4 F (36.9 C)    TempSrc:  Axillary    SpO2: 96%  97% 98%  Weight:      Height:       Physical Exam HENT:     Head:     Comments: Some dry crusting in his mouth. Eyes:     General: Lids are normal.     Conjunctiva/sclera: Conjunctivae normal.  Cardiovascular:     Rate and Rhythm: Normal rate. Rhythm irregularly irregular.     Heart sounds: Normal heart sounds, S1 normal and S2 normal.  Pulmonary:     Breath sounds: Examination of the right-lower field reveals decreased breath sounds. Examination of the left-lower field reveals decreased breath sounds. Decreased breath sounds present. No wheezing, rhonchi or rales.  Abdominal:     Palpations: Abdomen is soft.     Tenderness: There is no abdominal tenderness.  Musculoskeletal:     Right lower leg: Swelling present.     Left lower leg: Swelling present.  Skin:    General: Skin is warm.     Findings: No rash.  Neurological:     Mental Status: He is alert.     Comments: Able to lift up arms off the bed to command.  Unable to lift his legs up on his own but when I lifted up his right  leg he was able to hold it up therefore a second before dropping down.     Data Reviewed: Creatinine 1.23, potassium 3.6, magnesium 2.0, phosphorus 2.8, last BNP 899, hemoglobin 10.2, white blood cell count 11.1, platelet count 204  Family Communication: Spoke with patient's daughter on the phone  Disposition: Status is: Inpatient Remains inpatient appropriate because: Patient receiving NG tube feeds.  Will get palliative care consultation talked through goals of care.  Planned Discharge Destination: Rehab    Time spent: 28 minutes Case discussed with dietary and nursing staff  Author: Loletha Grayer, MD 08/13/2022 12:18 PM  For on call review [email or web address].

## 2022-08-13 NOTE — Progress Notes (Signed)
Nutrition Follow Up Note   DOCUMENTATION CODES:   Not applicable  INTERVENTION:   Change to Glucerna 1.5'@55ml'$ /hr- Initiate at 41m/hr and increase by 158mhr q 8 hours until goal rate is reached.   Free water flushes 10056m6 hours   Regimen provides 1980kcal/day, 109g/day protein and 1401m39my of free water.   Daily weights   Add Juven Fruit Punch BID via tube, each serving provides 95kcal and 2.5g of protein (amino acids glutamine and arginine)  NUTRITION DIAGNOSIS:   Inadequate oral intake related to lethargy/confusion as evidenced by per patient/family report. -ongoing   GOAL:   Patient will meet greater than or equal to 90% of their needs -met with tube feeds   MONITOR:   Labs, Weight trends, TF tolerance, Skin, I & O's, Diet advancement  ASSESSMENT:   76 y93 male with h/o HTN, dementia, DM, HLD, anxiety, BPH, kidney stones and PAF who is admitted with COVID 19, embolic CVA, UTI, sepsis, retroperitoneal hemorrhage and rhabomyolysis.  Pt s/p NGT placement 2/3; NGT noted to have side port in the gastric cardia; RN to advance   Pt continues to have confusion, NGT in place and pt is tolerating tube feeds well at goal rate. Pt with persistent hyperglycemia; RD will change pt over to Glucerna. No BM noted since 2/1; MD aware. Per chart, pt is down ~11lbs from his UBW. Pt -283ml40mhis I & Os. RD will add Juven to support wound healing. Pt remains at refeed risk.   Medications reviewed and include: vitamin C, pepcid, lasix, insulin, zinc  Labs reviewed: K 3.6 wnl, BUN 34(H), P 2.8 wnl, Mg 2.0 wnl BNP 899(H)- 2/5 Wbc- 11.1(H), Hgb 10.2(L), Hct 32.7(L) Cbgs- 270, 232 x 24 hrs  AIC 7.3(H)- 1/25  Diet Order:   Diet Order             Diet NPO time specified  Diet effective now                  EDUCATION NEEDS:   No education needs have been identified at this time  Skin:  Skin Assessment: Skin Integrity Issues: Skin Integrity Issues:: Stage II Stage II:  coccyx, lt thigh  Last BM:  2/1- type 6  Height:   Ht Readings from Last 1 Encounters:  08/01/22 '5\' 7"'$  (1.702 m)    Weight:   Wt Readings from Last 1 Encounters:  08/12/22 74.4 kg    Ideal Body Weight:  67.3 kg  BMI:  Body mass index is 25.69 kg/m.  Estimated Nutritional Needs:   Kcal:  1900-2200kcal/day  Protein:  95-110g/day  Fluid:  1.7-2.0L/day  CaseyKoleen DistanceRD, LDN Please refer to [facility]RD and/or RD on-call/weekend/after hours pager

## 2022-08-14 DIAGNOSIS — Z515 Encounter for palliative care: Secondary | ICD-10-CM

## 2022-08-14 DIAGNOSIS — A419 Sepsis, unspecified organism: Secondary | ICD-10-CM | POA: Diagnosis not present

## 2022-08-14 DIAGNOSIS — I6340 Cerebral infarction due to embolism of unspecified cerebral artery: Secondary | ICD-10-CM

## 2022-08-14 DIAGNOSIS — I4891 Unspecified atrial fibrillation: Secondary | ICD-10-CM | POA: Diagnosis not present

## 2022-08-14 DIAGNOSIS — I214 Non-ST elevation (NSTEMI) myocardial infarction: Secondary | ICD-10-CM | POA: Diagnosis not present

## 2022-08-14 LAB — CBC
HCT: 30.7 % — ABNORMAL LOW (ref 39.0–52.0)
Hemoglobin: 9.7 g/dL — ABNORMAL LOW (ref 13.0–17.0)
MCH: 29.9 pg (ref 26.0–34.0)
MCHC: 31.6 g/dL (ref 30.0–36.0)
MCV: 94.8 fL (ref 80.0–100.0)
Platelets: 264 10*3/uL (ref 150–400)
RBC: 3.24 MIL/uL — ABNORMAL LOW (ref 4.22–5.81)
RDW: 14 % (ref 11.5–15.5)
WBC: 12.8 10*3/uL — ABNORMAL HIGH (ref 4.0–10.5)
nRBC: 0 % (ref 0.0–0.2)

## 2022-08-14 LAB — BASIC METABOLIC PANEL
Anion gap: 9 (ref 5–15)
BUN: 42 mg/dL — ABNORMAL HIGH (ref 8–23)
CO2: 26 mmol/L (ref 22–32)
Calcium: 8.9 mg/dL (ref 8.9–10.3)
Chloride: 111 mmol/L (ref 98–111)
Creatinine, Ser: 1.22 mg/dL (ref 0.61–1.24)
GFR, Estimated: >60 mL/min (ref >60)
Glucose, Bld: 221 mg/dL — ABNORMAL HIGH (ref 70–99)
Potassium: 3.7 mmol/L (ref 3.5–5.1)
Sodium: 146 mmol/L — ABNORMAL HIGH (ref 135–145)

## 2022-08-14 LAB — GLUCOSE, CAPILLARY
Glucose-Capillary: 181 mg/dL — ABNORMAL HIGH (ref 70–99)
Glucose-Capillary: 192 mg/dL — ABNORMAL HIGH (ref 70–99)
Glucose-Capillary: 237 mg/dL — ABNORMAL HIGH (ref 70–99)
Glucose-Capillary: 174 mg/dL — ABNORMAL HIGH (ref 70–99)
Glucose-Capillary: 153 mg/dL — ABNORMAL HIGH (ref 70–99)
Glucose-Capillary: 152 mg/dL — ABNORMAL HIGH (ref 70–99)

## 2022-08-14 LAB — PHOSPHORUS: Phosphorus: 3.4 mg/dL (ref 2.5–4.6)

## 2022-08-14 LAB — MAGNESIUM: Magnesium: 2.1 mg/dL (ref 1.7–2.4)

## 2022-08-14 MED ORDER — MORPHINE SULFATE (PF) 2 MG/ML IV SOLN
1.0000 mg | INTRAVENOUS | Status: DC | PRN
  Administered 2022-08-14 – 2022-08-18 (×14): 1 mg via INTRAVENOUS
  Filled 2022-08-14 (×14): qty 1

## 2022-08-14 MED ORDER — ATENOLOL 25 MG PO TABS
25.0000 mg | ORAL_TABLET | Freq: Two times a day (BID) | ORAL | Status: DC
  Administered 2022-08-14 – 2022-08-17 (×6): 25 mg
  Filled 2022-08-14 (×7): qty 1

## 2022-08-14 MED ORDER — MORPHINE SULFATE (PF) 2 MG/ML IV SOLN
1.0000 mg | Freq: Once | INTRAVENOUS | Status: AC
  Administered 2022-08-14: 1 mg via INTRAVENOUS
  Filled 2022-08-14: qty 1

## 2022-08-14 MED ORDER — INSULIN ASPART 100 UNIT/ML IJ SOLN
3.0000 [IU] | INTRAMUSCULAR | Status: DC
  Administered 2022-08-14 – 2022-08-15 (×10): 3 [IU] via SUBCUTANEOUS
  Filled 2022-08-14 (×9): qty 1

## 2022-08-14 NOTE — Progress Notes (Signed)
SLP Cancellation Note  Patient Details Name: Russell Byrd MRN: [id number] DOB: [birth year]   Cancelled treatment:       Reason Eval/Treat Not Completed: Medical issues which prohibited therapy;Fatigue/lethargy limiting ability to participate;Patient's level of consciousness;Patient not medically ready (chart reviewed; consulted NSG and MD re: pt's status)  Pt continues to present w/ declined medical and Cognitive status per NSG and MD reports. He often presents w/ a stare and little engagement per NSG; intermittent alertness and a few verbalizations he struggles w/.  Recommend pt remain NPO d/t HIGH risk for aspiration/aspiration pneumonia until pt can meaningfully participate in a BSE/po intake safely. Pt has an active Palliative Care consult now -- MD agreed w/ waiting to see pt/Family GOC established.  Recommend ongoing oral care for hygiene and stimulation of swallowing. NSG agreed.     Russell Kenner, MS, CCC-SLP Speech Language Pathologist Rehab Services; [place] [phone] (ascom) [name] 08/14/2022, 1:50 PM

## 2022-08-14 NOTE — Progress Notes (Signed)
Physical Therapy Treatment Patient Details Name: Russell Byrd MRN: [id number] DOB: [birth year] Today's Date: 08/14/2022   History of Present Illness Patient is a 77 year old male with NSTEMI and atrial fibrillation with rapid ventricular response in the setting of altered mental status after lying on the floor for 3 days. Complicated by [id number] infection with cardiac catheterization on hold pending improvement in infection. Stay complicated by acute CVA in R PCA and noted cognition change. Re-evaluation performed 08/11/22.   PT Comments    The patient does open his eyes for the majority of session today. He is restless when his eyes are opened. Spontaneous movement of extremities noted, however patient is unable to follow commands and is resistive at times with ROM of extremities and does not actively engage in ROM/exercise efforts. He does not track the therapist around the room but he does turn his head in the direction of my voice (more to the left side than the right side). Mobility efforts not appropriate at this time given current participation level. No change in vitals with activity noted. He does not follow any commands during this session. PT will continue to follow on a trial basis. Noted Palliative Care consult is pending.    Recommendations for follow up therapy are one component of a multi-disciplinary discharge planning process, led by the attending physician.  Recommendations may be updated based on patient status, additional functional criteria and insurance authorization.  Follow Up Recommendations  Skilled nursing-short term rehab (<3 hours/day) Can patient physically be transported by private vehicle: No   Assistance Recommended at Discharge Frequent or constant Supervision/Assistance  Patient can return home with the following Two people to help with walking and/or transfers;Two people to help with bathing/dressing/bathroom   Equipment Recommendations  None recommended by  PT    Recommendations for Other Services       Precautions / Restrictions Precautions Precautions: Fall Restrictions Weight Bearing Restrictions: No     Mobility  Bed Mobility Overal bed mobility: Needs Assistance             General bed mobility comments: total assistance for repositioning in bed. patient has spontaneous movement but does not assist with purpouseful movement    Transfers                        Ambulation/Gait                   Stairs             Wheelchair Mobility    Modified Rankin (Stroke Patients Only)       Balance                                            Cognition Arousal/Alertness: Lethargic Behavior During Therapy: Restless Overall Cognitive Status: Impaired/Different from baseline                                 General Comments: unable to follow commands this date        Exercises General Exercises - Lower Extremity Ankle Circles/Pumps: PROM, Strengthening, Both, 10 reps, Supine Heel Slides: PROM, Strengthening, Both, 10 reps, Supine Hip ABduction/ADduction: PROM, Strengthening, Both, 10 reps, Supine Other Exercises Other Exercises: with verbal and tactile cues, patient does not participate with  ROM of LE. He is resistive at times with mobility efforts, especailly with ROM of RUE.    General Comments General comments (skin integrity, edema, etc.): the patient does open his eyes for the majority of session today. he is restless when his eyes are opened. spontaneous movement of extremities however patient is unable to follow commands. he does not track the therapist around the room but he does turn his head in the direction of my voice (more to the left side than the right side). mobility efforts not appropriate at this time given current participation level. no change in vitals with activity noted.      Pertinent Vitals/Pain Pain Assessment Pain Assessment:  CPOT Facial Expression: Grimacing Body Movements: Restlessness Muscle Tension: Tense, rigid Compliance with ventilator (intubated pts.): N/A Vocalization (extubated pts.): Sighing, moaning CPOT Total: 6    Home Living                          Prior Function            PT Goals (current goals can now be found in the care plan section) Acute Rehab PT Goals Patient Stated Goal: none stated PT Goal Formulation: Patient unable to participate in goal setting Time For Goal Achievement: 08/25/22 Potential to Achieve Goals: Poor Progress towards PT goals: Not progressing toward goals - comment    Frequency    Min 2X/week      PT Plan Current plan remains appropriate    Co-evaluation              AM-PAC PT "6 Clicks" Mobility   Outcome Measure  Help needed turning from your back to your side while in a flat bed without using bedrails?: Total Help needed moving from lying on your back to sitting on the side of a flat bed without using bedrails?: Total Help needed moving to and from a bed to a chair (including a wheelchair)?: Total Help needed standing up from a chair using your arms (e.g., wheelchair or bedside chair)?: Total Help needed to walk in hospital room?: Total Help needed climbing 3-5 steps with a railing? : Total 6 Click Score: 6    End of Session   Activity Tolerance: Patient limited by lethargy Patient left: in bed;with call bell/phone within reach Nurse Communication: Mobility status PT Visit Diagnosis: Unsteadiness on feet (R26.81);Muscle weakness (generalized) (M62.81)     Time: 1497-0263 PT Time Calculation (min) (ACUTE ONLY): 13 min  Charges:  $Therapeutic Activity: 8-22 mins                     Minna Merritts, PT, MPT    Percell Locus 08/14/2022, 3:10 PM

## 2022-08-14 NOTE — Progress Notes (Signed)
PROGRESS NOTE    Russell Byrd  ZTI:[id number] DOB: 11-07-1945 DOA: 08/02/2022 PCP: Valerie Roys, DO    Brief Narrative:  77 year old male with past medical history of paroxysmal atrial fibrillation, diabetes mellitus who is into the emergency room on 1/24 with confusion and fall and had been on the floor for 3 days prior to being found.  In the emergency room, patient found to have sepsis secondary to Enterobacter UTI as well as elevated.  Admitted to the hospitalist service and started on IV cefepime.  Troponins continue to trend upward and patient felt to have a non-STEMI.  Initial plan was for cardiac catheterization, but was delayed due to patient having some acute hospital delirium, likely in the setting of underlying dementia.  On 1/27, patient testing positive for COVID.   2/1, patient more confused, with decreased level of responsiveness.  Patient's hemoglobin down to 6.8 (was 8.3 the day prior) with creatinine at 1.5, previously normal.  White blood cell count elevated, but infection workup negative.  Patient ordered 1 unit packed red blood cells.  Heparin stopped at 8 AM.   2/2: Patient about the same, now with facial droop.  Stat CT scan of head done this morning noting 2 areas of low-density in right lower occipital temporal lobe and lower right frontal lobe concerning for acute/subacute infarcts.  In addition, despite 1 unit packed red blood cell transfusion on previous day, hemoglobin down to 6.5 and creatinine up further to 2.3.  Patient ordered 2 more units packed red blood cells.  By late morning, patient developed rapid atrial fibrillation and started on Cardizem drip and transferred to stepdown unit.  GI consulted, recommending conservative measures.   2/3: MRI done night prior confirms multiple areas of embolic CVA.  Neurology feels likely occurred secondary to atrial fibs despite heparin infusion.  Atrial fibrillation converted back to normal sinus rhythm.  CT of  abdomen/pelvis 2/3 notes small intramuscular hematoma within the left pelvic sidewall and small amount of right retroperitoneal hemorrhage with left retroperitoneal hematoma approximately 940 cc of blood.  Vascular surgery recommended consulting interventional radiology for possible intervention.   2/4: NG tube placed and tube feeds being initiated.  Patient with little change in mentation.  Renal function continues to improve although hemoglobin slightly lower.   2/5: Renal function and white blood cell count almost fully normalized.  Hemoglobin trending upward.  Patient seen by interventional radiology who recommended no intervention at this time.  Patient's BNP trending upward, at 900 today.  Restarted on Lasix with IV albumin added.  Mentation unchanged.   2/6.  Patient able to lift up his arms off the bed.  Weaker with his legs.  Unable to talk.  Advancing gastric tube.  Trying to get tube feeds up to goal.  Palliative care consultation  2/7: Patient unable to engage in conversation.  Unsafe for p.o. intake at this time.  Palliative consult requested yesterday currently pending.   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  Seen by neurology.  Currently unable to give anticoagulation with retroperitoneal bleed.  Supportive care.  Neurology following as needed   Retroperitoneal bleed Anticoagulation on hold.  Hemoglobin stable.  No intervention per IR   NSTEMI (non-ST elevated myocardial infarction)  ([place]) No plans for ischemic evaluation   Paroxysmal atrial fibrillation with RVR ([place]) Unable to give anticoagulation with retroperitoneal bleed.   Delirium Acute delirium.  Continue to monitor.  Spoke with patient's daughter that everything will key off the mental status.  Palliative care consultation to discuss goals of care.  Continue tube feedings.  If we are planning for discharge to skilled nursing facility patient will need PEG.  Will await palliative care discussion with family for goals of care prior to consideration of any invasive procedures   Sepsis due to gram-negative UTI ([place]) Received 5 days of cefepime.  Patient had Enterobacter in the urine culture.  No further antibiotics.  Monitor vitals and fever curve   Rhabdomyolysis Treated with IV fluids during the hospital course   COVID-19 virus infection Supportive care.  No symptoms of active COVID infection.  Defer to infection prevention but likely can discontinue COVID isolation precautions   Decubital ulcer Coccyx stage II and anterior thigh stage II.  See full description below.  Local wound care, present on admission   Dyslipidemia Holding off cholesterol medication at this time.   Type 2 diabetes mellitus without complications (HCC) Sliding scale coverage.  Tube feeding changed to Glucerna.   Essential hypertension Patient on isosorbide dinitrate and as needed Lopressor Add back atenolol for improved blood pressure control.  25 mg twice daily for 2   DVT prophylaxis: SCD Code Status: Full Family Communication: Disposition Plan: Status is: Inpatient Remains inpatient appropriate because: Severe encephalopathy post embolic CVA.  Status post retroperitoneal bleed  Level of care: Stepdown  Consultants:  Palliative care  Procedures:  None  Antimicrobials: None   Subjective: Seen and examined.  Verbally unresponsive.  Unable to participate in interview.  Does not follow commands.  Objective: Vitals:    08/14/22 0300 08/14/22 0320 08/14/22 0500 08/14/22 0900  BP:  (!) 152/65  126/71  Pulse: 91 83  75  Resp:  20  (!) 23  Temp:  100 F (37.8 C)  98.9 F (37.2 C)  TempSrc:  Axillary  Axillary  SpO2: 95% 95%  95%  Weight:   74.1 kg   Height:        Intake/Output Summary (Last 24 hours) at 08/14/2022 1431 Last data filed at 08/14/2022 0600 Gross per 24 hour  Intake 1586.75 ml  Output 1335 ml  Net 251.75 ml   Filed Weights   08/11/22 0403 08/12/22 0500 08/14/22 0500  Weight: 76.5 kg 74.4 kg 74.1 kg    Examination:  General exam: NAD. Respiratory system: Coarse breath sounds bilaterally.  Normal work of breathing.  2 L Cardiovascular system: S1-S2, RRR, no murmurs, no pedal edema Gastrointestinal system: Soft, NT/ND, normal bowel sounds, status post NG tube Central nervous system: Awake.  Oriented x 0 Extremities: Unable to assess Skin: No rashes, lesions or ulcers Psychiatry: Unable to assess    Data Reviewed: I have personally reviewed following labs and imaging studies  CBC: Recent Labs  Lab 08/09/22 1934 08/10/22 0347 08/11/22 0418 08/12/22 0322 08/13/22 0455 08/14/22 0622  WBC 15.4* 13.8* 12.4* 11.8* 11.1* 12.8*  NEUTROABS 12.4*  --   --   --   --   --   HGB 8.9* 10.6* 8.7* 9.4* 10.2* 9.7*  HCT 26.3* 33.5* 27.5* 28.7* 32.7* 30.7*  MCV 91.0 96.5 94.8 93.2 97.6 94.8  PLT 269 235 278 277 204 458   Basic Metabolic Panel: Recent Labs  Lab 08/10/22 0347 08/11/22 0418 08/11/22 1756 08/12/22 0322 08/12/22 1703 08/13/22 0455 08/14/22 0622  NA 143 147*  --  145  --  144 146*  K 4.1 4.0  --  3.5  --  3.6 3.7  CL 114* 115*  --  113*  --  111 111  CO2 17* 23  --  21*  --  23 26  GLUCOSE 156* 126*  --  165*  --  271* 221*  BUN 45* 40*  --  33*  --  34* 42*  CREATININE 1.81* 1.42*  --  1.20  --  1.23 1.22  CALCIUM 7.9* 8.4*  --  8.3*  --  8.6* 8.9  MG  --   --  2.1 2.1 2.0 2.0 2.1  PHOS  --   --  2.2* 2.4* 2.2* 2.8 3.4   GFR: Estimated Creatinine  Clearance: 48.2 mL/min (by C-G formula based on SCr of 1.22 mg/dL). Liver Function Tests: No results for input(s): "AST", "ALT", "ALKPHOS", "BILITOT", "PROT", "ALBUMIN" in the last 168 hours. No results for input(s): "LIPASE", "AMYLASE" in the last 168 hours. No results for input(s): "AMMONIA" in the last 168 hours. Coagulation Profile: No results for input(s): "INR", "PROTIME" in the last 168 hours. Cardiac Enzymes: No results for input(s): "CKTOTAL", "CKMB", "CKMBINDEX", "TROPONINI" in the last 168 hours. BNP (last 3 results) No results for input(s): "PROBNP" in the last 8760 hours. HbA1C: No results for input(s): "HGBA1C" in the last 72 hours. CBG: Recent Labs  Lab 08/13/22 2030 08/13/22 2239 08/14/22 0316 08/14/22 0741 08/14/22 1227  GLUCAP 240* 237* 181* 192* 237*   Lipid Profile: No results for input(s): "CHOL", "HDL", "LDLCALC", "TRIG", "CHOLHDL", "LDLDIRECT" in the last 72 hours. Thyroid Function Tests: No results for input(s): "TSH", "T4TOTAL", "FREET4", "T3FREE", "THYROIDAB" in the last 72 hours. Anemia Panel: No results for input(s): "VITAMINB12", "FOLATE", "FERRITIN", "TIBC", "IRON", "RETICCTPCT" in the last 72 hours. Sepsis Labs: Recent Labs  Lab 08/08/22 0622 08/10/22 0347  PROCALCITON 0.12 <0.10    Recent Results (from the past 240 hour(s))  MRSA Next Gen by PCR, Nasal     Status: None   Collection Time: 08/09/22 12:51 PM   Specimen: Nasal Mucosa; Nasal Swab  Result Value Ref Range Status   MRSA by PCR Next Gen NOT DETECTED NOT DETECTED Final    Comment: (NOTE) The GeneXpert MRSA Assay (FDA approved for NASAL specimens only), is one component of a comprehensive MRSA colonization surveillance program. It is not intended to diagnose MRSA infection nor to guide or monitor treatment for MRSA infections. Test performance is not FDA approved in patients less than 31 years old. Performed at [facility], [address] [place], [place] [address]           Radiology Studies: DG Abd Portable 1V  Result Date: 08/13/2022 CLINICAL DATA:  Encounter for nasogastric tube placement EXAM: PORTABLE ABDOMEN - 1 VIEW COMPARISON:  None Available. FINDINGS: NG tube with tip in the stomach. Side port below the GE junction. Nonobstructive bowel gas pattern. IMPRESSION: NG tube in stomach. Electronically Signed   By: Suzy Bouchard M.D.   On: 08/13/2022 13:19        Scheduled Meds:  sodium chloride   Intravenous Once   vitamin C  500 mg Per Tube BID  atenolol  25 mg Per Tube BID   Chlorhexidine Gluconate Cloth  6 each Topical Daily   famotidine  20 mg Per Tube Daily   free water  100 mL Per Tube Q6H   furosemide  20 mg Intravenous BID   insulin aspart  0-9 Units Subcutaneous Q4H   insulin aspart  3 Units Subcutaneous Q4H   isosorbide dinitrate  20 mg Per Tube TID   nutrition supplement (JUVEN)  1 packet Per Tube BID BM   polyethylene glycol  17 g Per Tube Daily   traZODone  25 mg Per Tube QHS   zinc sulfate  220 mg Per Tube Daily   Continuous Infusions:  feeding supplement (GLUCERNA 1.5 CAL) 45 mL/hr at 08/14/22 0736     LOS: 14 days     Sidney Ace, MD [facility]   If 7PM-7AM, please contact night-coverage  08/14/2022, 2:31 PM

## 2022-08-14 NOTE — TOC Progression Note (Signed)
Transition of Care [facility]) - Progression Note    Patient Details  Name: Russell Byrd MRN: [id number] Date of Birth: [birth year]  [facility] CM/SW Contact  Shelbie Hutching, RN Phone Number: 08/14/2022, 11:47 AM  Clinical Narrative:    Patient currently in the [facility] Stepdown status.  Patient is getting NG tube feeds.  SNF recommended by therapy.   Palliative has been consulted for goals of care.  For patient to go to SNF he would need a PEG tube. TOC will follow.         Expected Discharge Plan and Services                                               Social Determinants of Health (SDOH) Interventions SDOH Screenings   Depression (PHQ2-9): Low Risk  (05/27/2022)  Tobacco Use: High Risk (06/24/2022)    Readmission Risk Interventions     No data to display

## 2022-08-14 NOTE — Plan of Care (Signed)
Patient still struggles to remain alert today. Eye contact can usually be obtained but no words have been spoken this shift and still unable to follow instructions. An occasional smile/smirk but it will quickly leave, returning to his current baseline of little engagement and a stare.  Youngest daughter at the bedside most of the morning (engaging as able) - but still seems to have unrealistic expectations.

## 2022-08-15 ENCOUNTER — Inpatient Hospital Stay: Payer: PPO

## 2022-08-15 DIAGNOSIS — I6340 Cerebral infarction due to embolism of unspecified cerebral artery: Secondary | ICD-10-CM | POA: Diagnosis not present

## 2022-08-15 DIAGNOSIS — T796XXA Traumatic ischemia of muscle, initial encounter: Secondary | ICD-10-CM | POA: Diagnosis not present

## 2022-08-15 DIAGNOSIS — A419 Sepsis, unspecified organism: Secondary | ICD-10-CM | POA: Diagnosis not present

## 2022-08-15 DIAGNOSIS — I4891 Unspecified atrial fibrillation: Secondary | ICD-10-CM | POA: Diagnosis not present

## 2022-08-15 LAB — CBC WITH DIFFERENTIAL/PLATELET
Abs Immature Granulocytes: 0.12 10*3/uL — ABNORMAL HIGH (ref 0.00–0.07)
Abs Immature Granulocytes: 0.11 10*3/uL — ABNORMAL HIGH (ref 0.00–0.07)
Basophils Absolute: 0 10*3/uL (ref 0.0–0.1)
Basophils Absolute: 0.1 10*3/uL (ref 0.0–0.1)
Basophils Relative: 0 %
Basophils Relative: 0 %
Eosinophils Absolute: 0.5 10*3/uL (ref 0.0–0.5)
Eosinophils Absolute: 0.3 10*3/uL (ref 0.0–0.5)
Eosinophils Relative: 3 %
Eosinophils Relative: 2 %
HCT: 30.6 % — ABNORMAL LOW (ref 39.0–52.0)
HCT: 32.1 % — ABNORMAL LOW (ref 39.0–52.0)
Hemoglobin: 9.7 g/dL — ABNORMAL LOW (ref 13.0–17.0)
Hemoglobin: 9.9 g/dL — ABNORMAL LOW (ref 13.0–17.0)
Immature Granulocytes: 1 %
Immature Granulocytes: 1 %
Lymphocytes Relative: 8 %
Lymphocytes Relative: 7 %
Lymphs Abs: 1.1 10*3/uL (ref 0.7–4.0)
Lymphs Abs: 1 10*3/uL (ref 0.7–4.0)
MCH: 30.3 pg (ref 26.0–34.0)
MCH: 29.6 pg (ref 26.0–34.0)
MCHC: 31.7 g/dL (ref 30.0–36.0)
MCHC: 30.8 g/dL (ref 30.0–36.0)
MCV: 95.6 fL (ref 80.0–100.0)
MCV: 96.1 fL (ref 80.0–100.0)
Monocytes Absolute: 0.9 10*3/uL (ref 0.1–1.0)
Monocytes Absolute: 0.8 10*3/uL (ref 0.1–1.0)
Monocytes Relative: 6 %
Monocytes Relative: 6 %
Neutro Abs: 11.9 10*3/uL — ABNORMAL HIGH (ref 1.7–7.7)
Neutro Abs: 11.5 10*3/uL — ABNORMAL HIGH (ref 1.7–7.7)
Neutrophils Relative %: 82 %
Neutrophils Relative %: 84 %
Platelets: 243 10*3/uL (ref 150–400)
Platelets: 281 10*3/uL (ref 150–400)
RBC: 3.2 MIL/uL — ABNORMAL LOW (ref 4.22–5.81)
RBC: 3.34 MIL/uL — ABNORMAL LOW (ref 4.22–5.81)
RDW: 13.9 % (ref 11.5–15.5)
RDW: 14 % (ref 11.5–15.5)
WBC: 14.5 10*3/uL — ABNORMAL HIGH (ref 4.0–10.5)
WBC: 13.8 10*3/uL — ABNORMAL HIGH (ref 4.0–10.5)
nRBC: 0 % (ref 0.0–0.2)
nRBC: 0 % (ref 0.0–0.2)

## 2022-08-15 LAB — GLUCOSE, CAPILLARY
Glucose-Capillary: 194 mg/dL — ABNORMAL HIGH (ref 70–99)
Glucose-Capillary: 174 mg/dL — ABNORMAL HIGH (ref 70–99)
Glucose-Capillary: 186 mg/dL — ABNORMAL HIGH (ref 70–99)
Glucose-Capillary: 221 mg/dL — ABNORMAL HIGH (ref 70–99)
Glucose-Capillary: 193 mg/dL — ABNORMAL HIGH (ref 70–99)
Glucose-Capillary: 194 mg/dL — ABNORMAL HIGH (ref 70–99)

## 2022-08-15 LAB — COMPREHENSIVE METABOLIC PANEL
ALT: 58 U/L — ABNORMAL HIGH (ref 0–44)
AST: 52 U/L — ABNORMAL HIGH (ref 15–41)
Albumin: 2.6 g/dL — ABNORMAL LOW (ref 3.5–5.0)
Alkaline Phosphatase: 84 U/L (ref 38–126)
Anion gap: 11 (ref 5–15)
BUN: 69 mg/dL — ABNORMAL HIGH (ref 8–23)
CO2: 28 mmol/L (ref 22–32)
Calcium: 9.3 mg/dL (ref 8.9–10.3)
Chloride: 106 mmol/L (ref 98–111)
Creatinine, Ser: 1.41 mg/dL — ABNORMAL HIGH (ref 0.61–1.24)
GFR, Estimated: 52 mL/min — ABNORMAL LOW (ref >60)
Glucose, Bld: 207 mg/dL — ABNORMAL HIGH (ref 70–99)
Potassium: 4 mmol/L (ref 3.5–5.1)
Sodium: 145 mmol/L (ref 135–145)
Total Bilirubin: 1.2 mg/dL (ref 0.3–1.2)
Total Protein: 6.7 g/dL (ref 6.5–8.1)

## 2022-08-15 LAB — BLOOD GAS, VENOUS
Acid-Base Excess: 6.4 mmol/L — ABNORMAL HIGH (ref 0.0–2.0)
Bicarbonate: 31.3 mmol/L — ABNORMAL HIGH (ref 20.0–28.0)
O2 Saturation: 75.5 %
Patient temperature: 37
pCO2, Ven: 45 mmHg (ref 44–60)
pH, Ven: 7.45 — ABNORMAL HIGH (ref 7.25–7.43)
pO2, Ven: 51 mmHg — ABNORMAL HIGH (ref 32–45)

## 2022-08-15 LAB — LACTIC ACID, PLASMA: Lactic Acid, Venous: 1.9 mmol/L (ref 0.5–1.9)

## 2022-08-15 LAB — PHOSPHORUS: Phosphorus: 4.4 mg/dL (ref 2.5–4.6)

## 2022-08-15 LAB — MAGNESIUM: Magnesium: 2.2 mg/dL (ref 1.7–2.4)

## 2022-08-15 MED ORDER — ALBUMIN HUMAN 25 % IV SOLN
25.0000 g | Freq: Once | INTRAVENOUS | Status: AC
  Administered 2022-08-15: 25 g via INTRAVENOUS
  Filled 2022-08-15: qty 100

## 2022-08-15 MED ORDER — INSULIN ASPART 100 UNIT/ML IJ SOLN
0.0000 [IU] | Freq: Three times a day (TID) | INTRAMUSCULAR | Status: DC
  Administered 2022-08-16 (×2): 3 [IU] via SUBCUTANEOUS
  Administered 2022-08-16: 5 [IU] via SUBCUTANEOUS
  Filled 2022-08-15 (×3): qty 1

## 2022-08-15 MED ORDER — INSULIN ASPART 100 UNIT/ML IJ SOLN
3.0000 [IU] | Freq: Three times a day (TID) | INTRAMUSCULAR | Status: DC
  Administered 2022-08-16 (×3): 3 [IU] via SUBCUTANEOUS
  Filled 2022-08-15 (×3): qty 1

## 2022-08-15 MED ORDER — DIAZEPAM 5 MG/ML IJ SOLN
2.5000 mg | Freq: Four times a day (QID) | INTRAMUSCULAR | Status: DC | PRN
  Administered 2022-08-15: 2.5 mg via INTRAVENOUS
  Filled 2022-08-15: qty 2

## 2022-08-15 MED ORDER — LACTATED RINGERS IV BOLUS
500.0000 mL | Freq: Once | INTRAVENOUS | Status: AC
  Administered 2022-08-15: 500 mL via INTRAVENOUS

## 2022-08-15 NOTE — Consult Note (Signed)
Consultation Note Date: 08/15/2022 at 14  Patient Name: Russell Byrd  DOB: [birth year]  MRN: [id number]  Age / Sex: 77 y.o., male  PCP: Valerie Roys, DO Referring Physician: Sidney Ace, MD  Reason for Consultation: Establishing goals of care  HPI/Patient Profile: 77 y.o. male  with past medical history of paroxysmal atrial fibrillation, diabetes mellitus who is into the emergency room on 1/24 with confusion and fall and had been on the floor for 3 days prior to being found admitted on 07/16/2022 with sepsis secondary to Enterobacter UTI as well as elevated troponins.  Patient was initially treated for non-STEMI.  On 1/27, patient tested positive for COVID.  Patient became more confused with decreased level of responsiveness.  Course of hospitalization has included drop in hemoglobin and multiple areas of embolic CVAs (confirmed by MRI) despite use of heparin gtt for AFib  2/4 NG tube placed for nutritional support and goal TF not yet reached.   Clinical Assessment and Goals of Care: I have reviewed medical records including EPIC notes, labs and imaging, assessed the patient and then met with patient's daughter Russell Byrd at bedside to discuss diagnosis prognosis, Craig, EOL wishes, disposition and options.  Russell Byrd requested that we speak outside of room so that patient did not hear "anything negative".   I introduced Palliative Medicine as specialized medical care for people living with serious illness. It focuses on providing relief from the symptoms and stress of a serious illness. The goal is to improve quality of life for both the patient and the family.  We discussed a brief life review of the patient. Russell Byrd shares she has not been able to see her father in 3+ years since he stayed home and quarantined during the Covid19 pandemic. She shares he has 5 children in total - Angela (local), Lucia [place], [place], and Vina. She shares Crystal is estranged from patient and that Lattie Haw is ina rehab facility - only to be contacted if patient is at EOL.   We discussed patient's current illness and what it means in the larger context of patient's on-going co-morbidities.  Results of CT and MRI reviewed, CVA despite use of hepain gtt, and retroperitoneal bleed reviewed. Natural disease trajectory discussed.  I attempted to elicit values and goals of care important to the patient and Russell Byrd. She shares she knows he is "going downhill" but is "staying hopeful for something to change". She shares her siblings Cecille Rubin and Annie Main understand patient's current medical situation as well. They are all in agreement to continue to give patient more time to see if he improves.  In light of her thoughts, I attempted to discuss long term prognosis and outcomes. I shared that his functional, nutritional, and cognitive status are poor. His NG tube with tube feeds is a temporary method of artificial nutrition and hydration. Further discussions and evaluation of what the patient would want need to be had in regards to converting tube feeds to PEG tube. While we are not at that crossroads today, we  reviewed importance of discussion boundaries of medical care prior to decisions or emergencies coming to fruition.   Code status discussed. Russell Byrd shares that during this hospitalization and prior to his strokes that patient shared he would want everything done to keep him alive. However, in light of his strokes, she is concerned that his opinion might be different if he could full appreciate the situation he is in now. I encouraged Russell Byrd to keep patient's wishes and values at center of all medical decision making.   Questions and concerns were addressed. PMT information given. Russell Byrd was encouraged to call with questions or concerns. I plan to touch base with other children and continue [place] discussions.   Primary Decision  Maker NEXT OF KIN  Physical Exam Vitals reviewed.  Constitutional:      General: He is not in acute distress.    Appearance: He is not ill-appearing.  HENT:     Head: Normocephalic.     Mouth/Throat:     Mouth: Mucous membranes are moist.  Cardiovascular:     Rate and Rhythm: Normal rate.     Pulses: Normal pulses.  Pulmonary:     Effort: Pulmonary effort is normal.  Abdominal:     Palpations: Abdomen is soft.  Musculoskeletal:     Comments: Does not MAETC  Neurological:     Comments: Non verbal     Palliative Assessment/Data: 30%     Thank you for this consult. Palliative medicine will continue to follow and assist holistically.   Time Total: 75 minutes Greater than 50%  of this time was spent counseling and coordinating care related to the above assessment and plan.  Signed by: Jordan Hawks, DNP, FNP-BC Palliative Medicine    Please contact Palliative Medicine Team phone at [phone] for questions and concerns.  For individual provider: See Shea Evans

## 2022-08-15 NOTE — Progress Notes (Signed)
Palliative Care Progress Note, Assessment & Plan   Patient Name: Russell Byrd       Date: 08/15/2022 DOB: [birth year]  Age: 77 y.o. MRN#: [id number] Attending Physician: Sidney Ace, MD Primary Care Physician: Valerie Roys, DO Admit Date: 07/14/2022  Subjective: Patient is lying in bed in no apparent distress.  NG tube to right nare in place.  Patient is unable to awaken, acknowledge my presence, or make his wishes known.  His sister Webb Silversmith is at bedside.  HPI: 77 y.o. male  with past medical history of paroxysmal atrial fibrillation, diabetes mellitus who is into the emergency room on 1/24 with confusion and fall and had been on the floor for 3 days prior to being found admitted on 07/27/2022 with sepsis secondary to Enterobacter UTI as well as elevated troponins.  Patient was initially treated for non-STEMI.  On 1/27, patient tested positive for COVID.   Patient became more confused with decreased level of responsiveness.  Course of hospitalization has included drop in hemoglobin and multiple areas of embolic CVAs (confirmed by MRI) despite use of heparin gtt for AFib   2/4 NG tube placed for nutritional support and goal TF not yet reached.   Summary of counseling/coordination of care: After reviewing the patient's chart and assessing the patient at bedside, I spoke with Webb Silversmith in regards to plan and goals of care.  Webb Silversmith asked questions in regards to diagnosis, prognosis, quality of life, and next steps for patient.  Patient's current medical condition, neurology's notes, and long-term prognosis discussed in detail. Temporary NG and long term PEG discussed.   I attempted to elicit goals and values important to the patient.  As per his sister, she shares he would never want to live this way.  She  shares he has been a social hermit and staying home for the past several years.  She shares his quality of life has been poor because he would rarely even leave the house to go in his yard or go anywhere.  He declined others coming to help him, to take him out to dinner/get groceries, or to take him to doctors appointments.  She shares that if the patient could stand up and see the situation he is in right now she believes that he would want a little more time to see if there is improvement. She also believes he would trust the doctor's with knowing how long to "watch for improvement" before allowing him to pass peacefull. 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  We discussed comfort measures, liberating him from artifical support such as feeding tubes, and allowing a natural death. Webb Silversmith shares she believes this is what he would want after enough time has passed for him to be given the chance of recovery.   Hard choices for loving people book given to [place].  I encouraged her to share our discussion with patient's children.  Webb Silversmith shares his children are not as realistic and understanding of patient's current situation and long-term prognosis as she is.  She is hopeful she can get them to understand that his situation is not going to greatly improved to give him any  sort of significant quality of life as he had before.  Therapeutic silence and active listening provided for Webb Silversmith to share her thoughts and emotions regarding current medical situation.  Emotional support provided.  Ongoing support and discussions needed. PMT will continue to follow.   Physical Exam Vitals reviewed.  Constitutional:      Appearance: He is normal weight.  HENT:     Nose:     Comments: NG in place    Mouth/Throat:     Mouth: Mucous membranes are moist.  Cardiovascular:     Rate and Rhythm: Normal rate.     Pulses: Normal pulses.  Pulmonary:     Effort: Pulmonary effort is  normal.  Abdominal:     Palpations: Abdomen is soft.  Musculoskeletal:     Comments: Does not MAETC  Skin:    General: Skin is warm and dry.  Neurological:     Mental Status: He is alert.             Total Time 50 minutes   Margarite Vessel L. Ilsa Iha, FNP-BC Palliative Medicine Team Team Phone # [phone]

## 2022-08-15 NOTE — Progress Notes (Signed)
PROGRESS NOTE    Russell Byrd  [name] DOB: [birth year] DOA: 07/30/2022 PCP: Valerie Roys, DO    Brief Narrative:  77 year old male with past medical history of paroxysmal atrial fibrillation, diabetes mellitus who is into the emergency room on 1/24 with confusion and fall and had been on the floor for 3 days prior to being found.  In the emergency room, patient found to have sepsis secondary to Enterobacter UTI as well as elevated.  Admitted to the hospitalist service and started on IV cefepime.  Troponins continue to trend upward and patient felt to have a non-STEMI.  Initial plan was for cardiac catheterization, but was delayed due to patient having some acute hospital delirium, likely in the setting of underlying dementia.  On 1/27, patient testing positive for COVID.   2/1, patient more confused, with decreased level of responsiveness.  Patient's hemoglobin down to 6.8 (was 8.3 the day prior) with creatinine at 1.5, previously normal.  White blood cell count elevated, but infection workup negative.  Patient ordered 1 unit packed red blood cells.  Heparin stopped at 8 AM.   2/2: Patient about the same, now with facial droop.  Stat CT scan of head done this morning noting 2 areas of low-density in right lower occipital temporal lobe and lower right frontal lobe concerning for acute/subacute infarcts.  In addition, despite 1 unit packed red blood cell transfusion on previous day, hemoglobin down to 6.5 and creatinine up further to 2.3.  Patient ordered 2 more units packed red blood cells.  By late morning, patient developed rapid atrial fibrillation and started on Cardizem drip and transferred to stepdown unit.  GI consulted, recommending conservative measures.   2/3: MRI done night prior confirms multiple areas of embolic CVA.  Neurology feels likely occurred secondary to atrial fibs despite heparin infusion.  Atrial fibrillation converted back to normal sinus rhythm.  CT of  abdomen/pelvis 2/3 notes small intramuscular hematoma within the left pelvic sidewall and small amount of right retroperitoneal hemorrhage with left retroperitoneal hematoma approximately 940 cc of blood.  Vascular surgery recommended consulting interventional radiology for possible intervention.   2/4: NG tube placed and tube feeds being initiated.  Patient with little change in mentation.  Renal function continues to improve although hemoglobin slightly lower.   2/5: Renal function and white blood cell count almost fully normalized.  Hemoglobin trending upward.  Patient seen by interventional radiology who recommended no intervention at this time.  Patient's BNP trending upward, at 900 today.  Restarted on Lasix with IV albumin added.  Mentation unchanged.   2/6.  Patient able to lift up his arms off the bed.  Weaker with his legs.  Unable to talk.  Advancing gastric tube.  Trying to get tube feeds up to goal.  Palliative care consultation  2/7: Patient unable to engage in conversation.  Unsafe for p.o. intake at this time.  Palliative consult requested yesterday currently pending.  D/8: No appreciable status changes.  Youngest daughter and patient room during my rounds this morning.  Attempted to get family member to interact with patient.  Minimal interaction.  Patient does open his eyes.  Does not track.  Does not obviously follow commands.   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  Seen by neurology.   Currently unable to give anticoagulation with retroperitoneal bleed.  Supportive care.  Neurology following as needed.  Recovery potential unclear at this time.   Retroperitoneal bleed Anticoagulation on hold.  Hemoglobin stable.  No intervention per IR   NSTEMI (non-ST elevated myocardial infarction) ([place]) No plans for ischemic evaluation   Paroxysmal atrial fibrillation with RVR ([place]) Unable to give anticoagulation with retroperitoneal bleed.   Delirium Given obtunded state is difficult to distinguish delirium.  Currently the patient is status post NG tube placement and is on tube feeds.  Recovery potential is unclear.  Palliative care following   Sepsis due to gram-negative UTI (HCC) Received 5 days of cefepime.  Patient had Enterobacter in the urine culture.  No further antibiotics.  Monitor vitals and fever curve   Rhabdomyolysis Treated with IV fluids during the hospital course   COVID-19 virus infection Supportive care.  No symptoms of active COVID infection.  Defer to infection prevention but likely can discontinue COVID isolation precautions   Decubital ulcer Coccyx stage II and anterior thigh stage II.  See full description below.  Local wound care, present on admission   Dyslipidemia Holding off cholesterol medication at this time.   Type 2 diabetes mellitus without complications (HCC) Sliding scale coverage.  Tube feeding changed to Glucerna.   Essential hypertension Patient on isosorbide dinitrate and as needed Lopressor Add back atenolol for improved blood pressure control.  25 mg twice daily for 2   DVT prophylaxis: SCD Code Status: Full Family Communication: Daughter Mickel Baas [phone] on 2/7, daughter [name] at bedside 2/8 Disposition Plan: Status is: Inpatient Remains inpatient appropriate because: Severe encephalopathy post embolic CVA.  Status post retroperitoneal bleed  Level of care: Stepdown  Consultants:  Palliative care  Procedures:   None  Antimicrobials: None   Subjective: Seen and examined.  Verbally unresponsive.  Unable to participate in interview.  Does not follow commands.  Objective: Vitals:   08/15/22 0427 08/15/22 0500 08/15/22 0600 08/15/22 1000  BP:  139/61 (!) 131/47 (!) 119/49  Pulse: (!) 57 60 62 (!) 55  Resp: (!) 27 (!) 21 (!) 25 (!) 29  Temp:    98.7 F (37.1 C)  TempSrc:    Axillary  SpO2: 95% 98% 98% 100%  Weight: 71 kg     Height:        Intake/Output Summary (Last 24 hours) at 08/15/2022 1437 Last data filed at 08/15/2022 0500 Gross per 24 hour  Intake 772.5 ml  Output 975 ml  Net -202.5 ml   Filed Weights   08/12/22 0500 08/14/22 0500 08/15/22 0427  Weight: 74.4 kg 74.1 kg 71 kg    Examination:  General exam: No visible distress. Respiratory system: Coarse breath sounds bilaterally.  Normal work of breathing.  2 L Cardiovascular system: S1-S2, RRR, no murmurs, no pedal edema Gastrointestinal system: Soft, NT/ND, normal bowel sounds, status post NG tube Central nervous system: Awake.  Oriented x 0 Extremities: Unable to assess Skin: No rashes, lesions or ulcers Psychiatry: Unable to assess    Data Reviewed: I have personally reviewed following labs and imaging studies  CBC: Recent Labs  Lab 08/09/22 1934 08/10/22 0347 08/11/22 0418 08/12/22 0322 08/13/22 0455 08/14/22 0622 08/15/22  1059  WBC 15.4*   < > 12.4* 11.8* 11.1* 12.8* 14.5*  NEUTROABS 12.4*  --   --   --   --   --  11.9*  HGB 8.9*   < > 8.7* 9.4* 10.2* 9.7* 9.7*  HCT 26.3*   < > 27.5* 28.7* 32.7* 30.7* 30.6*  MCV 91.0   < > 94.8 93.2 97.6 94.8 95.6  PLT 269   < > 278 277 204 264 243   < > = values in this interval not displayed.   Basic Metabolic Panel: Recent Labs  Lab 08/11/22 0418 08/11/22 1756 08/12/22 0322 08/12/22 1703 08/13/22 0455 08/14/22 0622 08/15/22 0344 08/15/22 1059  NA 147*  --  145  --  144 146*  --  145  K 4.0  --  3.5  --  3.6 3.7  --  4.0  CL 115*  --  113*  --  111 111   --  106  CO2 23  --  21*  --  23 26  --  28  GLUCOSE 126*  --  165*  --  271* 221*  --  207*  BUN 40*  --  33*  --  34* 42*  --  69*  CREATININE 1.42*  --  1.20  --  1.23 1.22  --  1.41*  CALCIUM 8.4*  --  8.3*  --  8.6* 8.9  --  9.3  MG  --    < > 2.1 2.0 2.0 2.1 2.2  --   PHOS  --    < > 2.4* 2.2* 2.8 3.4 4.4  --    < > = values in this interval not displayed.   GFR: Estimated Creatinine Clearance: 41.7 mL/min (A) (by C-G formula based on SCr of 1.41 mg/dL (H)). Liver Function Tests: Recent Labs  Lab 08/15/22 1059  AST 52*  ALT 58*  ALKPHOS 84  BILITOT 1.2  PROT 6.7  ALBUMIN 2.6*   No results for input(s): "LIPASE", "AMYLASE" in the last 168 hours. No results for input(s): "AMMONIA" in the last 168 hours. Coagulation Profile: No results for input(s): "INR", "PROTIME" in the last 168 hours. Cardiac Enzymes: No results for input(s): "CKTOTAL", "CKMB", "CKMBINDEX", "TROPONINI" in the last 168 hours. BNP (last 3 results) No results for input(s): "PROBNP" in the last 8760 hours. HbA1C: No results for input(s): "HGBA1C" in the last 72 hours. CBG: Recent Labs  Lab 08/14/22 1551 08/14/22 1942 08/14/22 2326 08/15/22 0333 08/15/22 0745  GLUCAP 174* 153* 152* 194* 174*   Lipid Profile: No results for input(s): "CHOL", "HDL", "LDLCALC", "TRIG", "CHOLHDL", "LDLDIRECT" in the last 72 hours. Thyroid Function Tests: No results for input(s): "TSH", "T4TOTAL", "FREET4", "T3FREE", "THYROIDAB" in the last 72 hours. Anemia Panel: No results for input(s): "VITAMINB12", "FOLATE", "FERRITIN", "TIBC", "IRON", "RETICCTPCT" in the last 72 hours. Sepsis Labs: Recent Labs  Lab 08/10/22 0347  PROCALCITON <0.10    Recent Results (from the past 240 hour(s))  MRSA Next Gen by PCR, Nasal     Status: None   Collection Time: 08/09/22 12:51 PM   Specimen: Nasal Mucosa; Nasal Swab  Result Value Ref Range Status   MRSA by PCR Next Gen NOT DETECTED NOT DETECTED Final    Comment: (NOTE) The  GeneXpert MRSA Assay (FDA approved for NASAL specimens only), is one component of a comprehensive MRSA colonization surveillance program. It is not intended to diagnose MRSA infection nor to guide or monitor treatment for MRSA infections. Test performance is not  FDA approved in patients less than 2 years old. Performed at [facility], [address], [place], [place] [address]          Radiology Studies: No results found.      Scheduled Meds:  sodium chloride   Intravenous Once   vitamin C  500 mg Per Tube BID   atenolol  25 mg Per Tube BID   Chlorhexidine Gluconate Cloth  6 each Topical Daily   famotidine  20 mg Per Tube Daily   free water  100 mL Per Tube Q6H   furosemide  20 mg Intravenous BID   insulin aspart  0-9 Units Subcutaneous Q4H   insulin aspart  3 Units Subcutaneous Q4H   isosorbide dinitrate  20 mg Per Tube TID   nutrition supplement (JUVEN)  1 packet Per Tube BID BM   polyethylene glycol  17 g Per Tube Daily   traZODone  25 mg Per Tube QHS   zinc sulfate  220 mg Per Tube Daily   Continuous Infusions:  feeding supplement (GLUCERNA 1.5 CAL) 1,000 mL (08/14/22 1812)     LOS: 15 days     Sidney Ace, MD [facility]   If 7PM-7AM, please contact night-coverage  08/15/2022, 2:37 PM

## 2022-08-15 NOTE — Progress Notes (Signed)
       CROSS COVER NOTE  NAME: Russell Byrd MRN: [id number] DOB : 07-13-1945    HPI/Events of Note   Nurse reports patient decline in mental status without any response to noxious stimuli  Assessment and  Interventions   Assessment: General - , hypotensive with MAP 59. 61, heart rate 60s, RR 38-40 Neuo - spontaneous bretahing, protecting ariway, Pupils equal 2 NR, no response to deep sternal rub Resp - diminished bases - tachypneic with short shallow breathing pattern CBG 193 Plan:  500 ml LR bolus 25 gm albumin x 1 CBC/diff whit count continuing to improve,  CMP shows A KI and hypoalbuminemia Chest xray with likely hypoventilation  VBG repsiratory alkalosis,  CT head w/o contrast no acute findings  Procal <0.10   Lactic - initial 1.9 decrease to 1.5, bc x2 - pending  Likely hypoactive delirium or consider endocrine workup      Kathlene Cote NP [facility]

## 2022-08-15 NOTE — Progress Notes (Signed)
SLP Cancellation Note  Patient Details Name: [name] MRN: [id number] DOB: [birth year]   Cancelled treatment:       Reason Eval/Treat Not Completed: Medical issues which prohibited therapy;Fatigue/lethargy limiting ability to participate;Patient's level of consciousness;Patient not medically ready   Pt's LOA not supportive of meaningful participation in SLP evaluation. Palliative Consult pending. Will continue efforts as appropriate.  Cherrie Gauze, M.S., [facility] [phone] [place])  Quintella Baton 08/15/2022, 11:04 AM

## 2022-08-16 DIAGNOSIS — R58 Hemorrhage, not elsewhere classified: Secondary | ICD-10-CM | POA: Diagnosis not present

## 2022-08-16 DIAGNOSIS — U071 COVID-19: Secondary | ICD-10-CM | POA: Diagnosis not present

## 2022-08-16 DIAGNOSIS — I214 Non-ST elevation (NSTEMI) myocardial infarction: Secondary | ICD-10-CM | POA: Diagnosis not present

## 2022-08-16 DIAGNOSIS — I6340 Cerebral infarction due to embolism of unspecified cerebral artery: Secondary | ICD-10-CM | POA: Diagnosis not present

## 2022-08-16 DIAGNOSIS — Z7189 Other specified counseling: Secondary | ICD-10-CM

## 2022-08-16 LAB — URINALYSIS, COMPLETE (UACMP) WITH MICROSCOPIC
Bilirubin Urine: NEGATIVE
Glucose, UA: NEGATIVE mg/dL
Ketones, ur: NEGATIVE mg/dL
Leukocytes,Ua: NEGATIVE
Nitrite: NEGATIVE
Protein, ur: 100 mg/dL — AB
Specific Gravity, Urine: 1.021 (ref 1.005–1.030)
pH: 5 (ref 5.0–8.0)

## 2022-08-16 LAB — COMPREHENSIVE METABOLIC PANEL
ALT: 64 U/L — ABNORMAL HIGH (ref 0–44)
AST: 50 U/L — ABNORMAL HIGH (ref 15–41)
Albumin: 2.6 g/dL — ABNORMAL LOW (ref 3.5–5.0)
Alkaline Phosphatase: 100 U/L (ref 38–126)
Anion gap: 8 (ref 5–15)
BUN: 72 mg/dL — ABNORMAL HIGH (ref 8–23)
CO2: 30 mmol/L (ref 22–32)
Calcium: 9.4 mg/dL (ref 8.9–10.3)
Chloride: 107 mmol/L (ref 98–111)
Creatinine, Ser: 1.78 mg/dL — ABNORMAL HIGH (ref 0.61–1.24)
GFR, Estimated: 39 mL/min — ABNORMAL LOW (ref >60)
Glucose, Bld: 197 mg/dL — ABNORMAL HIGH (ref 70–99)
Potassium: 4.2 mmol/L (ref 3.5–5.1)
Sodium: 145 mmol/L (ref 135–145)
Total Bilirubin: 1.1 mg/dL (ref 0.3–1.2)
Total Protein: 7 g/dL (ref 6.5–8.1)

## 2022-08-16 LAB — GLUCOSE, CAPILLARY
Glucose-Capillary: 221 mg/dL — ABNORMAL HIGH (ref 70–99)
Glucose-Capillary: 262 mg/dL — ABNORMAL HIGH (ref 70–99)
Glucose-Capillary: 217 mg/dL — ABNORMAL HIGH (ref 70–99)
Glucose-Capillary: 143 mg/dL — ABNORMAL HIGH (ref 70–99)
Glucose-Capillary: 147 mg/dL — ABNORMAL HIGH (ref 70–99)

## 2022-08-16 LAB — MAGNESIUM: Magnesium: 2.3 mg/dL (ref 1.7–2.4)

## 2022-08-16 LAB — PROCALCITONIN
Procalcitonin: <0.1 ng/mL
Procalcitonin: <0.1 ng/mL

## 2022-08-16 LAB — PHOSPHORUS: Phosphorus: 5.2 mg/dL — ABNORMAL HIGH (ref 2.5–4.6)

## 2022-08-16 LAB — LACTIC ACID, PLASMA: Lactic Acid, Venous: 1.5 mmol/L (ref 0.5–1.9)

## 2022-08-16 MED ORDER — SODIUM CHLORIDE 0.9 % IV SOLN
3.0000 g | Freq: Four times a day (QID) | INTRAVENOUS | Status: DC
  Administered 2022-08-16 – 2022-08-18 (×9): 3 g via INTRAVENOUS
  Filled 2022-08-16: qty 8
  Filled 2022-08-16: qty 3
  Filled 2022-08-16: qty 8
  Filled 2022-08-16: qty 3
  Filled 2022-08-16 (×2): qty 8
  Filled 2022-08-16 (×2): qty 3
  Filled 2022-08-16: qty 8
  Filled 2022-08-16 (×3): qty 3
  Filled 2022-08-16: qty 8

## 2022-08-16 MED ORDER — INSULIN ASPART 100 UNIT/ML IJ SOLN
0.0000 [IU] | INTRAMUSCULAR | Status: DC
  Administered 2022-08-16 – 2022-08-17 (×3): 1 [IU] via SUBCUTANEOUS
  Administered 2022-08-17 (×2): 2 [IU] via SUBCUTANEOUS
  Administered 2022-08-17: 1 [IU] via SUBCUTANEOUS
  Administered 2022-08-17: 2 [IU] via SUBCUTANEOUS
  Administered 2022-08-17 – 2022-08-18 (×4): 1 [IU] via SUBCUTANEOUS
  Filled 2022-08-16 (×11): qty 1

## 2022-08-16 MED ORDER — ORAL CARE MOUTH RINSE: 15.0000 mL | OROMUCOSAL | Status: DC | PRN

## 2022-08-16 MED ORDER — ORAL CARE MOUTH RINSE
15.0000 mL | OROMUCOSAL | Status: DC
  Administered 2022-08-16 – 2022-08-18 (×10): 15 mL via OROMUCOSAL

## 2022-08-16 MED ORDER — INSULIN ASPART 100 UNIT/ML IJ SOLN
3.0000 [IU] | INTRAMUSCULAR | Status: DC
  Administered 2022-08-16 – 2022-08-18 (×11): 3 [IU] via SUBCUTANEOUS
  Filled 2022-08-16 (×11): qty 1

## 2022-08-16 MED ORDER — GLUCERNA 1.5 CAL PO LIQD
1000.0000 mL | ORAL | Status: DC
  Administered 2022-08-16 – 2022-08-17 (×2): 1000 mL

## 2022-08-16 MED ORDER — GLYCOPYRROLATE 0.2 MG/ML IJ SOLN
0.1000 mg | INTRAMUSCULAR | Status: DC | PRN
  Administered 2022-08-16 – 2022-08-18 (×3): 0.1 mg via INTRAVENOUS
  Filled 2022-08-16 (×4): qty 1

## 2022-08-16 NOTE — Progress Notes (Signed)
PROGRESS NOTE    Russell Byrd  [id number] DOB: Oct 31, 1945 DOA: 07/24/2022 PCP: Valerie Roys, DO    Brief Narrative:  77 year old male with past medical history of paroxysmal atrial fibrillation, diabetes mellitus who is into the emergency room on 1/24 with confusion and fall and had been on the floor for 3 days prior to being found.  In the emergency room, patient found to have sepsis secondary to Enterobacter UTI as well as elevated.  Admitted to the hospitalist service and started on IV cefepime.  Troponins continue to trend upward and patient felt to have a non-STEMI.  Initial plan was for cardiac catheterization, but was delayed due to patient having some acute hospital delirium, likely in the setting of underlying dementia.  On 1/27, patient testing positive for COVID.   2/1, patient more confused, with decreased level of responsiveness.  Patient's hemoglobin down to 6.8 (was 8.3 the day prior) with creatinine at 1.5, previously normal.  White blood cell count elevated, but infection workup negative.  Patient ordered 1 unit packed red blood cells.  Heparin stopped at 8 AM.   2/2: Patient about the same, now with facial droop.  Stat CT scan of head done this morning noting 2 areas of low-density in right lower occipital temporal lobe and lower right frontal lobe concerning for acute/subacute infarcts.  In addition, despite 1 unit packed red blood cell transfusion on previous day, hemoglobin down to 6.5 and creatinine up further to 2.3.  Patient ordered 2 more units packed red blood cells.  By late morning, patient developed rapid atrial fibrillation and started on Cardizem drip and transferred to stepdown unit.  GI consulted, recommending conservative measures.   2/3: MRI done night prior confirms multiple areas of embolic CVA.  Neurology feels likely occurred secondary to atrial fibs despite heparin infusion.  Atrial fibrillation converted back to normal sinus rhythm.  CT of  abdomen/pelvis 2/3 notes small intramuscular hematoma within the left pelvic sidewall and small amount of right retroperitoneal hemorrhage with left retroperitoneal hematoma approximately 940 cc of blood.  Vascular surgery recommended consulting interventional radiology for possible intervention.   2/4: NG tube placed and tube feeds being initiated.  Patient with little change in mentation.  Renal function continues to improve although hemoglobin slightly lower.   2/5: Renal function and white blood cell count almost fully normalized.  Hemoglobin trending upward.  Patient seen by interventional radiology who recommended no intervention at this time.  Patient's BNP trending upward, at 900 today.  Restarted on Lasix with IV albumin added.  Mentation unchanged.   2/6.  Patient able to lift up his arms off the bed.  Weaker with his legs.  Unable to talk.  Advancing gastric tube.  Trying to get tube feeds up to goal.  Palliative care consultation  2/7: Patient unable to engage in conversation.  Unsafe for p.o. intake at this time.  Palliative consult requested yesterday currently pending.  2/8: No appreciable status changes.  Youngest daughter and patient room during my rounds this morning.  Attempted to get family member to interact with patient.  Minimal interaction.  Patient does open his eyes.  Does not track.  Does not obviously follow commands.  2/9: Deterioration in mental status noted overnight.  Unable to arouse even with noxious stimulus.  Workup overall unrevealing.  Did have a mild low-grade temperature.  Infectious workup has been initiated.   Assessment & Plan:   Principal Problem:   Embolic stroke [facility]) Active Problems:  Retroperitoneal bleed   Paroxysmal atrial fibrillation with RVR (HCC)   NSTEMI (non-ST elevated myocardial infarction) ([place])   Delirium   Sepsis due to gram-negative UTI (HCC)   Rhabdomyolysis   Essential hypertension   Type 2 diabetes mellitus without  complications ([place])   Dyslipidemia   Sepsis secondary to UTI ([place])   Decubital ulcer   COVID-19 virus infection  New fever Noted on 2/9 early a.m.  Unclear etiology.  Difficult to exclude infectious etiology however negative procalcitonin with normal white blood cell count speaks away from this.  That being said this is one of the potentially reversible causes that we can identify and address.  Blood and urine cultures taken.  Will initiate empiric Unasyn for aspiration precautions.  * Embolic stroke [facility]) Multiple embolic strokes seen on MRI of the brain.  Seen by neurology.  Currently unable to give anticoagulation with retroperitoneal bleed.  Supportive care.  Neurology following as needed.  Recovery potential unclear at this time.  Very guarded prognosis   Retroperitoneal bleed Anticoagulation on hold.  Hemoglobin stable.  No intervention per IR   NSTEMI (non-ST elevated myocardial infarction) ([place]) No plans for ischemic evaluation   Paroxysmal atrial fibrillation with RVR ([place]) Unable to give anticoagulation with retroperitoneal bleed.   Delirium Given obtunded state is difficult to distinguish delirium.  Currently the patient is status post NG tube placement and is on tube feeds.  Recovery potential is unclear.  Palliative care following   Sepsis due to gram-negative UTI (HCC) Received 5 days of cefepime.  Back on antibiotics as above   Rhabdomyolysis Treated with IV fluids during the hospital course   COVID-19 virus infection Supportive care.  No symptoms of active COVID infection.  Defer to infection prevention but likely can discontinue COVID isolation precautions   Decubital ulcer Coccyx stage II and anterior thigh stage II.  See full description below.  Local wound care, present on admission   Dyslipidemia Holding off cholesterol medication at this time.   Type 2 diabetes mellitus without complications (HCC) Sliding scale coverage.  Tube feeding changed to  Glucerna.   Essential hypertension Patient on isosorbide dinitrate and as needed Lopressor Add back atenolol for improved blood pressure control.  25 mg twice daily for 2   DVT prophylaxis: SCD Code Status: Full Family Communication: Daughter Mickel Baas [phone] on 2/7, daughter [name] at bedside 2/8.  Daughter Mickel Baas at bedside 2/9 Disposition Plan: Status is: Inpatient Remains inpatient appropriate because: Severe encephalopathy post embolic CVA.  Status post retroperitoneal bleed  Level of care: Stepdown  Consultants:  Palliative care  Procedures:  None  Antimicrobials: None   Subjective: Seen and examined.  Remains encephalopathic.  Does not participate in interview.  Does not follow commands.  Objective: Vitals:   08/16/22 1100 08/16/22 1200 08/16/22 1215 08/16/22 1218  BP: (!) 112/52 139/69    Pulse: 61 (!) 51 (!) 58 60  Resp: (!) 26 (!) 24 (!) 25 (!) 23  Temp:    98.9 F (37.2 C)  TempSrc:    Axillary  SpO2: 99% 93% 98% 96%  Weight:      Height:        Intake/Output Summary (Last 24 hours) at 08/16/2022 1436 Last data filed at 08/16/2022 1048 Gross per 24 hour  Intake 1650 ml  Output 500 ml  Net 1150 ml   Filed Weights   08/14/22 0500 08/15/22 0427 08/16/22 0350  Weight: 74.1 kg 71 kg 70 kg    Examination:  General exam:  Encephalopathic.  Obtunded Respiratory system: Coarse breath sounds bilaterally.  Normal work of breathing.  2 L Cardiovascular system: S1-S2, RRR, no murmurs, no pedal edema Gastrointestinal system: Soft, NT/ND, normal bowel sounds, status post NG tube Central nervous system: Awake.  Oriented x 0.  Intermittently agitated Extremities: Unable to assess Skin: No rashes, lesions or ulcers Psychiatry: Unable to assess    Data Reviewed: I have personally reviewed following labs and imaging studies  CBC: Recent Labs  Lab 08/09/22 1934 08/10/22 0347 08/12/22 0322 08/13/22 0455 08/14/22 0622 08/15/22 1059 08/15/22 2240  WBC  15.4*   < > 11.8* 11.1* 12.8* 14.5* 13.8*  NEUTROABS 12.4*  --   --   --   --  11.9* 11.5*  HGB 8.9*   < > 9.4* 10.2* 9.7* 9.7* 9.9*  HCT 26.3*   < > 28.7* 32.7* 30.7* 30.6* 32.1*  MCV 91.0   < > 93.2 97.6 94.8 95.6 96.1  PLT 269   < > 277 204 264 243 281   < > = values in this interval not displayed.   Basic Metabolic Panel: Recent Labs  Lab 08/12/22 0322 08/12/22 1703 08/13/22 0455 08/14/22 0622 08/15/22 0344 08/15/22 1059 08/15/22 2240 08/16/22 0049  NA 145  --  144 146*  --  145 145  --   K 3.5  --  3.6 3.7  --  4.0 4.2  --   CL 113*  --  111 111  --  106 107  --   CO2 21*  --  23 26  --  28 30  --   GLUCOSE 165*  --  271* 221*  --  207* 197*  --   BUN 33*  --  34* 42*  --  69* 72*  --   CREATININE 1.20  --  1.23 1.22  --  1.41* 1.78*  --   CALCIUM 8.3*  --  8.6* 8.9  --  9.3 9.4  --   MG 2.1 2.0 2.0 2.1 2.2  --   --  2.3  PHOS 2.4* 2.2* 2.8 3.4 4.4  --   --  5.2*   GFR: Estimated Creatinine Clearance: 33 mL/min (A) (by C-G formula based on SCr of 1.78 mg/dL (H)). Liver Function Tests: Recent Labs  Lab 08/15/22 1059 08/15/22 2240  AST 52* 50*  ALT 58* 64*  ALKPHOS 84 100  BILITOT 1.2 1.1  PROT 6.7 7.0  ALBUMIN 2.6* 2.6*   No results for input(s): "LIPASE", "AMYLASE" in the last 168 hours. No results for input(s): "AMMONIA" in the last 168 hours. Coagulation Profile: No results for input(s): "INR", "PROTIME" in the last 168 hours. Cardiac Enzymes: No results for input(s): "CKTOTAL", "CKMB", "CKMBINDEX", "TROPONINI" in the last 168 hours. BNP (last 3 results) No results for input(s): "PROBNP" in the last 8760 hours. HbA1C: No results for input(s): "HGBA1C" in the last 72 hours. CBG: Recent Labs  Lab 08/15/22 1927 08/15/22 2258 08/16/22 0337 08/16/22 0728 08/16/22 1127  GLUCAP 193* 194* 221* 262* 217*   Lipid Profile: No results for input(s): "CHOL", "HDL", "LDLCALC", "TRIG", "CHOLHDL", "LDLDIRECT" in the last 72 hours. Thyroid Function Tests: No  results for input(s): "TSH", "T4TOTAL", "FREET4", "T3FREE", "THYROIDAB" in the last 72 hours. Anemia Panel: No results for input(s): "VITAMINB12", "FOLATE", "FERRITIN", "TIBC", "IRON", "RETICCTPCT" in the last 72 hours. Sepsis Labs: Recent Labs  Lab 08/10/22 0347 08/15/22 2240 08/16/22 0049  PROCALCITON <0.10 <0.10 <0.10  LATICACIDVEN  --  1.9 1.5    Recent  Results (from the past 240 hour(s))  MRSA Next Gen by PCR, Nasal     Status: None   Collection Time: 08/09/22 12:51 PM   Specimen: Nasal Mucosa; Nasal Swab  Result Value Ref Range Status   MRSA by PCR Next Gen NOT DETECTED NOT DETECTED Final    Comment: (NOTE) The GeneXpert MRSA Assay (FDA approved for NASAL specimens only), is one component of a comprehensive MRSA colonization surveillance program. It is not intended to diagnose MRSA infection nor to guide or monitor treatment for MRSA infections. Test performance is not FDA approved in patients less than 78 years old. Performed at [facility], [address] [place], [place] [address]   Culture, blood (Routine X 2) w Reflex to ID Panel     Status: None (Preliminary result)   Collection Time: 08/15/22 10:40 PM   Specimen: BLOOD  Result Value Ref Range Status   Specimen Description BLOOD LEFT AC  Final   Special Requests   Final    BOTTLES DRAWN AEROBIC AND ANAEROBIC Blood Culture results may not be optimal due to an excessive volume of blood received in culture bottles   Culture   Final    NO GROWTH < 12 HOURS Performed at [facility], [address], [place], [place] [address]    Report Status PENDING  Incomplete  Culture, blood (Routine X 2) w Reflex to ID Panel     Status: None (Preliminary result)   Collection Time: 08/15/22 10:40 PM   Specimen: BLOOD  Result Value Ref Range Status   Specimen Description BLOOD LEFT FA  Final   Special Requests   Final    BOTTLES DRAWN AEROBIC AND ANAEROBIC Blood Culture adequate volume   Culture   Final     NO GROWTH < 12 HOURS Performed at [facility], [address], [place], [place] [address]    Report Status PENDING  Incomplete         Radiology Studies: CT HEAD WO CONTRAST (5MM)  Result Date: 08/16/2022 CLINICAL DATA:  Follow-up examination for stroke. Flattening mental status. EXAM: CT HEAD WITHOUT CONTRAST TECHNIQUE: Contiguous axial images were obtained from the base of the skull through the vertex without intravenous contrast. RADIATION DOSE REDUCTION: This exam was performed according to the departmental dose-optimization program which includes automated exposure control, adjustment of the mA and/or kV according to patient size and/or use of iterative reconstruction technique. COMPARISON:  Comparison made with prior CT and MRI from 08/09/2022. FINDINGS: Brain: Age-related cerebral atrophy with chronic small vessel ischemic disease. Few scatter remote lacunar infarcts present about the right basal ganglia. There has been continued interval evolution of the subacute right PCA distribution infarct, similar in size and distribution. Additional smaller areas of acute ischemia involving the thalami and right ACA territory are grossly stable as well. No significant regional mass effect. No evidence for hemorrhagic transformation by CT. No other new large vessel territory infarct. No acute intracranial hemorrhage. No mass lesion or midline shift. No hydrocephalus or extra-axial fluid collection. Vascular: No abnormal hyperdense vessel. Scattered vascular calcifications noted within the carotid siphons. Skull: Scalp soft tissues and calvarium demonstrate no acute finding. Sinuses/Orbits: Globes and orbital soft tissues within normal limits. Nasogastric tube in place. Scattered mucosal thickening present throughout the paranasal sinuses. No significant mastoid effusion. Other: None. IMPRESSION: 1. Continued interval evolution of subacute right PCA distribution infarct, similar in size and  distribution. Additional smaller areas of acute ischemia involving the thalami and right ACA territory  are grossly stable as well. No significant regional mass effect. No evidence for hemorrhagic transformation by CT. 2. No other new acute intracranial abnormality. 3. Underlying atrophy with chronic small vessel ischemic disease. Electronically Signed   By: Jeannine Boga M.D.   On: 08/16/2022 00:59   DG Chest Port 1 View  Result Date: 08/15/2022 CLINICAL DATA:  10027 Tachypnea 10027 EXAM: PORTABLE CHEST 1 VIEW COMPARISON:  Chest x-ray 07/30/2022 FINDINGS: Enteric tube coursing below the diaphragm with tip and side port overlying the expected region of the gastric lumen. The heart and mediastinal contours are within normal limits. Aortic calcification. Low lung volumes. Left base airspace opacity likely atelectasis. No pulmonary edema. No pleural effusion. No pneumothorax. No acute osseous abnormality. IMPRESSION: 1. Low lung volumes with left base airspace opacities - likely atelectasis. Superimposed infection undulation cannot be fully excluded. 2.  Aortic Atherosclerosis (ICD10-I70.0). Electronically Signed   By: Iven Finn M.D.   On: 08/15/2022 22:26        Scheduled Meds:  sodium chloride   Intravenous Once   vitamin C  500 mg Per Tube BID   atenolol  25 mg Per Tube BID   Chlorhexidine Gluconate Cloth  6 each Topical Daily   famotidine  20 mg Per Tube Daily   free water  100 mL Per Tube Q6H   insulin aspart  0-9 Units Subcutaneous Q4H   insulin aspart  3 Units Subcutaneous Q4H   isosorbide dinitrate  20 mg Per Tube TID   nutrition supplement (JUVEN)  1 packet Per Tube BID BM   mouth rinse  15 mL Mouth Rinse 4 times per day   polyethylene glycol  17 g Per Tube Daily   traZODone  25 mg Per Tube QHS   zinc sulfate  220 mg Per Tube Daily   Continuous Infusions:  ampicillin-sulbactam (UNASYN) IV 3 g (08/16/22 0857)   feeding supplement (GLUCERNA 1.5 CAL) 25 mL/hr at 08/16/22  0830     LOS: 16 days     Sidney Ace, MD [facility]   If 7PM-7AM, please contact night-coverage  08/16/2022, 2:36 PM

## 2022-08-16 NOTE — Progress Notes (Signed)
Daily Progress Note   Patient Name: Russell Byrd       Date: 08/16/2022 DOB: March 03, 1946  Age: 77 y.o. MRN#: [id number] Attending Physician: Sidney Ace, MD Primary Care Physician: Valerie Roys, DO Admit Date: 07/10/2022  Reason for Consultation/Follow-up: Establishing goals of care  Patient Profile/HPI:   77 y.o. male  with past medical history of paroxysmal atrial fibrillation, diabetes mellitus who is into the emergency room on 1/24 with confusion and fall and had been on the floor for 3 days prior to being found admitted on 08/02/2022 with sepsis secondary to Enterobacter UTI as well as elevated troponins.  Patient was initially treated for non-STEMI.  On 1/27, patient tested positive for COVID.   Patient became more confused with decreased level of responsiveness.  Course of hospitalization has included drop in hemoglobin and multiple areas of embolic CVAs (confirmed by MRI) despite use of heparin gtt for AFib   2/4 NG tube placed for nutritional support and goal TF not yet reached.   2/9- pt had decrease in mental status overnight, CT head w/ continued evolution of R PCA infarct. Additional smaller areas of acute ischemia involving the thalami, and R ACA territory are stable, no other acute findings, low grade fever on 2/8, UA pending, Chest xray with hypoventilation, superimposed infection cannot be excluded  Subjective: Chart reviewed including labs, progress notes, imaging from this and previous encounters.  2 of patient's daughters were at bedside.  They report seeing improvements in his mental status.  He is smiling in response to their voices.  He is grunting attempting to talk.  On my eval he does not follow commands he does not open his eyes.  Daughter expressed some  frustration, feels that she is getting conflicting information from providers.  Her understanding from neurology was that patient would have a complete recovery.  Her understanding from attending is that the recommendation is for comfort care. We discussed wanting to see progressive improvement, however unfortunately with his hospitalization he has not had great improvements and in fact has had declines. CODE STATUS was discussed-daughter continues to endorse full CODE STATUS noting that this was her father's adamant wish before hospitalization. There are 3 siblings who she would like to have altogether for full goals of care conversation.  They are not available until tomorrow.  Review of Systems  Unable to perform ROS: Mental status change     Physical Exam Vitals and nursing note reviewed.  HENT:     Head:     Comments: NG tube in place Cardiovascular:     Rate and Rhythm: Normal rate.  Pulmonary:     Comments: Respirations wet Neurological:     Comments: Nonverbal, moving upper extremities not purposefully, lower extremities respond to noxious stimuli             Vital Signs: BP (!) 112/52   Pulse 61   Temp 99 F (37.2 C) (Axillary)   Resp (!) 26   Ht 5' 7"$  (1.702 m)   Wt 70 kg   SpO2 99%   BMI 24.17 kg/m  SpO2: SpO2: 99 % O2 Device: O2 Device: Nasal Cannula O2 Flow Rate: O2 Flow Rate (L/min): 3 L/min  Intake/output summary:  Intake/Output Summary (Last 24 hours) at 08/16/2022 1145 Last data filed at 08/16/2022 1048 Gross per 24 hour  Intake 1650 ml  Output 500 ml  Net 1150 ml   LBM: Last BM Date : 08/14/22 Baseline Weight: Weight: 79.7 kg Most recent weight: Weight: 70 kg       Palliative Assessment/Data:PPS: 20%      Patient Active Problem List   Diagnosis Date Noted   Embolic stroke ([place]) 0000000   Decubital ulcer 08/13/2022   Retroperitoneal bleed 08/13/2022   COVID-19 virus infection 08/13/2022   Delirium 08/02/2022   Essential hypertension  08/01/2022   Type 2 diabetes mellitus without complications ([place]) [id number]   Dyslipidemia 08/01/2022   Rhabdomyolysis 08/01/2022   Sepsis secondary to UTI ([employer]) 08/01/2022   Paroxysmal atrial fibrillation with RVR ([place]) 08/01/2022   NSTEMI (non-ST elevated myocardial infarction) ([place]) 08/01/2022   Sepsis due to gram-negative UTI ([place]) 07/13/2022   Atherosclerosis of aorta ([place]) 05/27/2022   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   Senile purpura ([place]) 11/19/2021   Strain of right biceps muscle 03/22/2021   Controlled substance agreement signed 11/13/2020   Advanced care planning/counseling discussion 10/13/2017   Hypertension associated with diabetes ([place]) 07/15/2017   Hyperlipidemia 07/15/2017   Diabetes mellitus associated with hormonal etiology ([place]) 07/15/2017   BPH (benign prostatic hyperplasia) 10/07/2016   Primary osteoarthritis of right knee 04/04/2016   Chronic anxiety 04/03/2015    Palliative Care Assessment & Plan    Assessment/Recommendations/Plan  Continue full code full scope for now Family would like to arrange meeting tomorrow with available medical team, unfortunately no Palliative service on weekend- will let attending provider know   Code Status: Full code  Prognosis:  Unable to determine  Discharge Planning: To Be Determined  Care plan was discussed with family and care team.   Thank you for allowing the Palliative Medicine Team to assist in the care of this patient.   Greater than 50%  of this time was spent counseling and coordinating care related to the above assessment and plan.  Mariana Kaufman, AGNP-C Palliative Medicine   Please contact Palliative Medicine Team phone at [phone] for questions and concerns.

## 2022-08-16 NOTE — Progress Notes (Signed)
Pharmacy Antibiotic Note  Russell Byrd is a 77 y.o. male w/ PMH of HTN, HLD, urolithiasis, DM admitted on 08/04/2022 with AMS now with PNA.  Pharmacy has been consulted for Unasyn dosing.  Plan: start Unasyn 3 grams IV every 6 hours ---follow renal function for needed dose adjustments  Height: 5' 7"$  (170.2 cm) Weight: 70 kg (154 lb 5.2 oz) IBW/kg (Calculated) : 66.1  Temp (24hrs), Avg:99 F (37.2 C), Min:97.5 F (36.4 C), Max:100.7 F (38.2 C)  Recent Labs  Lab 08/12/22 0322 08/13/22 0455 08/14/22 0622 08/15/22 1059 08/15/22 2240 08/16/22 0049  WBC 11.8* 11.1* 12.8* 14.5* 13.8*  --   CREATININE 1.20 1.23 1.22 1.41* 1.78*  --   LATICACIDVEN  --   --   --   --  1.9 1.5    Estimated Creatinine Clearance: 33 mL/min (A) (by C-G formula based on SCr of 1.78 mg/dL (H)).    Allergies  Allergen Reactions   Amlodipine Swelling   Flomax [Tamsulosin] Swelling    Pt states swelling to face and swelling to lower extremities   Hydrochlorothiazide Other (See Comments)    Urinary frequency   Prednisone Other (See Comments)    steroids   Tetracycline     Other reaction(s): Unknown   Tetracyclines & Related    Tramadol Other (See Comments)    Keeps him awake    Antimicrobials this admission: ceftriaxone 1/24 >> 1/25 cefepime 1/26 >> 1/31 Unasyn 2/9 >>  Microbiology results: 01/24 BCx: NG 02/08 BCx: NGTD 01/24 UCx: E aerogenes  02/02 MRSA PCR: negative  Thank you for allowing pharmacy to be a part of this patient's care.  Dallie Piles 08/16/2022 7:38 AM

## 2022-08-16 NOTE — Progress Notes (Signed)
SLP Cancellation Note  Patient Details Name: Russell Byrd MRN: [id number] DOB: [birth year]   Cancelled treatment:       Reason Eval/Treat Not Completed: Medical issues which prohibited therapy;Patient's level of consciousness;Patient not medically ready (chart reviewed; consulted NSG re: pt's status today)  Pt continues to present w/ declined medical and Cognitive status' per NSG/MD reports, Palliative Care notes, and presentation. He is not able to sustain any meaningful alertness. Pt's technical order in chart is for a cognitive-linguistic evaluation, however, pt has been unable to meaningfully able to participate in such x5 attempts(on 5 different days). He has declined medically per MD and Palliative Care notes and has remained NPO d/t HIGH risk for aspiration/aspiration pneumonia also.  This morning, NSG gave medication(robinul) to ease increased secretions and wet respirations.    Will sign off of this pt at this time until pt can meaningfully participate in evaluations. Recommend continue f/u w/ Palliative Care/MD for GOC. Recommend ongoing oral care for hygiene and stimulation of swallowing. MD to reconsult ST services when appropriate. NSG agreed.      Orinda Kenner, MS, CCC-SLP Speech Language Pathologist Rehab Services; [place] [phone] (ascom) Azel Gumina 08/16/2022, 2:11 PM

## 2022-08-17 DIAGNOSIS — I6340 Cerebral infarction due to embolism of unspecified cerebral artery: Secondary | ICD-10-CM | POA: Diagnosis not present

## 2022-08-17 LAB — BASIC METABOLIC PANEL
Anion gap: 9 (ref 5–15)
BUN: 86 mg/dL — ABNORMAL HIGH (ref 8–23)
CO2: 31 mmol/L (ref 22–32)
Calcium: 9 mg/dL (ref 8.9–10.3)
Chloride: 113 mmol/L — ABNORMAL HIGH (ref 98–111)
Creatinine, Ser: 1.55 mg/dL — ABNORMAL HIGH (ref 0.61–1.24)
GFR, Estimated: 46 mL/min — ABNORMAL LOW (ref >60)
Glucose, Bld: 165 mg/dL — ABNORMAL HIGH (ref 70–99)
Potassium: 4.4 mmol/L (ref 3.5–5.1)
Sodium: 153 mmol/L — ABNORMAL HIGH (ref 135–145)

## 2022-08-17 LAB — PROCALCITONIN: Procalcitonin: <0.1 ng/mL

## 2022-08-17 LAB — GLUCOSE, CAPILLARY
Glucose-Capillary: 140 mg/dL — ABNORMAL HIGH (ref 70–99)
Glucose-Capillary: 146 mg/dL — ABNORMAL HIGH (ref 70–99)
Glucose-Capillary: 149 mg/dL — ABNORMAL HIGH (ref 70–99)
Glucose-Capillary: 149 mg/dL — ABNORMAL HIGH (ref 70–99)
Glucose-Capillary: 158 mg/dL — ABNORMAL HIGH (ref 70–99)
Glucose-Capillary: 165 mg/dL — ABNORMAL HIGH (ref 70–99)
Glucose-Capillary: 171 mg/dL — ABNORMAL HIGH (ref 70–99)

## 2022-08-17 LAB — CBC WITH DIFFERENTIAL/PLATELET
Abs Immature Granulocytes: 0.1 10*3/uL — ABNORMAL HIGH (ref 0.00–0.07)
Basophils Absolute: 0 10*3/uL (ref 0.0–0.1)
Basophils Relative: 0 %
Eosinophils Absolute: 0.4 10*3/uL (ref 0.0–0.5)
Eosinophils Relative: 4 %
HCT: 31.2 % — ABNORMAL LOW (ref 39.0–52.0)
Hemoglobin: 9.3 g/dL — ABNORMAL LOW (ref 13.0–17.0)
Immature Granulocytes: 1 %
Lymphocytes Relative: 12 %
Lymphs Abs: 1.3 10*3/uL (ref 0.7–4.0)
MCH: 29.5 pg (ref 26.0–34.0)
MCHC: 29.8 g/dL — ABNORMAL LOW (ref 30.0–36.0)
MCV: 99 fL (ref 80.0–100.0)
Monocytes Absolute: 0.7 10*3/uL (ref 0.1–1.0)
Monocytes Relative: 7 %
Neutro Abs: 8.2 10*3/uL — ABNORMAL HIGH (ref 1.7–7.7)
Neutrophils Relative %: 76 %
Platelets: 196 10*3/uL (ref 150–400)
RBC: 3.15 MIL/uL — ABNORMAL LOW (ref 4.22–5.81)
RDW: 13.6 % (ref 11.5–15.5)
WBC: 10.7 10*3/uL — ABNORMAL HIGH (ref 4.0–10.5)
nRBC: 0 % (ref 0.0–0.2)

## 2022-08-17 MED ORDER — FREE WATER
200.0000 mL | Freq: Four times a day (QID) | Status: DC
  Administered 2022-08-17 (×3): 200 mL

## 2022-08-17 MED ORDER — VANCOMYCIN HCL 1250 MG/250ML IV SOLN: 1250.0000 mg | INTRAVENOUS | Status: DC

## 2022-08-17 MED ORDER — HALOPERIDOL 2 MG PO TABS
2.0000 mg | ORAL_TABLET | Freq: Four times a day (QID) | ORAL | Status: DC | PRN
  Administered 2022-08-17 – 2022-08-18 (×3): 2 mg
  Filled 2022-08-17 (×4): qty 1

## 2022-08-17 MED ORDER — HALOPERIDOL LACTATE 5 MG/ML IJ SOLN: 2.0000 mg | Freq: Four times a day (QID) | INTRAMUSCULAR | Status: DC | PRN

## 2022-08-17 MED ORDER — VANCOMYCIN HCL 1500 MG/300ML IV SOLN
1500.0000 mg | Freq: Once | INTRAVENOUS | Status: AC
  Administered 2022-08-17: 1500 mg via INTRAVENOUS
  Filled 2022-08-17: qty 300

## 2022-08-17 NOTE — IPAL (Signed)
  Interdisciplinary Goals of Care Family Meeting   Date carried out: 08/17/2022  Location of the meeting: Bedside  Member's involved: Physician and Family Member or next of kin  Durable Power of Attorney or acting medical decision maker: Russell Byrd    Discussion: We discussed goals of care for Russell Byrd .  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  We discussed his new onset fever, lack of clear infectious etiology and poor prognosis this carries.  Guarded prognosis at this time.  All questions were answered to family's satisfaction.  CODE STATUS changed to DNR  Code status:   Code Status: DNR   Disposition: Continue current acute care  Time spent for the meeting: [place], MD  08/17/2022, 2:48 PM

## 2022-08-17 NOTE — Progress Notes (Signed)
PROGRESS NOTE    Russell Byrd  [id number] DOB: [birth year] DOA: 08/06/2022 PCP: Valerie Roys, DO    Brief Narrative:  77 year old male with past medical history of paroxysmal atrial fibrillation, diabetes mellitus who is into the emergency room on 1/24 with confusion and fall and had been on the floor for 3 days prior to being found.  In the emergency room, patient found to have sepsis secondary to Enterobacter UTI as well as elevated.  Admitted to the hospitalist service and started on IV cefepime.  Troponins continue to trend upward and patient felt to have a non-STEMI.  Initial plan was for cardiac catheterization, but was delayed due to patient having some acute hospital delirium, likely in the setting of underlying dementia.  On 1/27, patient testing positive for COVID.   2/1, patient more confused, with decreased level of responsiveness.  Patient's hemoglobin down to 6.8 (was 8.3 the day prior) with creatinine at 1.5, previously normal.  White blood cell count elevated, but infection workup negative.  Patient ordered 1 unit packed red blood cells.  Heparin stopped at 8 AM.   2/2: Patient about the same, now with facial droop.  Stat CT scan of head done this morning noting 2 areas of low-density in right lower occipital temporal lobe and lower right frontal lobe concerning for acute/subacute infarcts.  In addition, despite 1 unit packed red blood cell transfusion on previous day, hemoglobin down to 6.5 and creatinine up further to 2.3.  Patient ordered 2 more units packed red blood cells.  By late morning, patient developed rapid atrial fibrillation and started on Cardizem drip and transferred to stepdown unit.  GI consulted, recommending conservative measures.   2/3: MRI done night prior confirms multiple areas of embolic CVA.  Neurology feels likely occurred secondary to atrial fibs despite heparin infusion.  Atrial fibrillation converted back to normal sinus rhythm.  CT of  abdomen/pelvis 2/3 notes small intramuscular hematoma within the left pelvic sidewall and small amount of right retroperitoneal hemorrhage with left retroperitoneal hematoma approximately 940 cc of blood.  Vascular surgery recommended consulting interventional radiology for possible intervention.   2/4: NG tube placed and tube feeds being initiated.  Patient with little change in mentation.  Renal function continues to improve although hemoglobin slightly lower.   2/5: Renal function and white blood cell count almost fully normalized.  Hemoglobin trending upward.  Patient seen by interventional radiology who recommended no intervention at this time.  Patient's BNP trending upward, at 900 today.  Restarted on Lasix with IV albumin added.  Mentation unchanged.   2/6.  Patient able to lift up his arms off the bed.  Weaker with his legs.  Unable to talk.  Advancing gastric tube.  Trying to get tube feeds up to goal.  Palliative care consultation  2/7: Patient unable to engage in conversation.  Unsafe for p.o. intake at this time.  Palliative consult requested yesterday currently pending.  2/8: No appreciable status changes.  Youngest daughter and patient room during my rounds this morning.  Attempted to get family member to interact with patient.  Minimal interaction.  Patient does open his eyes.  Does not track.  Does not obviously follow commands.  2/9: Deterioration in mental status noted overnight.  Unable to arouse even with noxious stimulus.  Workup overall unrevealing.  Did have a mild low-grade temperature.  Infectious workup has been initiated.  2/10: No appreciable change in mental status.  At times agitated.  Recurrent fever noted to  102 last night.   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 recurrent fever again on 2/10.  Unclear etiology.  Difficult to exclude infectious etiology however negative procalcitonin with normal white blood cell count speaks away from this.  That being said this is one of the potentially reversible causes that we can identify and address.  Blood and urine cultures taken.   Plan: Continue Unasyn Add vancomycin with pharmacy dosing Follow cultures, no growth to date Monitor vitals and fever curve  Goals of care I had an extended conversation with multiple family members at bedside on 2/10.  Explained that patient's mental status precluded any advancement in therapy and recovery potential is unknown.  Also detailed the new onset fever and likely poor prognosis this carries.  All questions answered to family satisfaction.  Patient's CODE STATUS changed to DNR.  * Embolic stroke [facility]) Multiple embolic strokes seen on MRI of the brain.  Seen by neurology.  Currently unable to give anticoagulation with retroperitoneal bleed.  Supportive care.  Neurology following as needed.  Recovery potential unclear at this time.  Very guarded prognosis   Retroperitoneal bleed Anticoagulation on hold.  Hemoglobin stable.  No intervention per IR   NSTEMI (non-ST elevated myocardial infarction) ([place]) No plans for ischemic evaluation   Paroxysmal atrial fibrillation with RVR ([place]) Unable to give anticoagulation with retroperitoneal bleed.   Delirium Given obtunded state is difficult to distinguish delirium.  Currently the patient is status post NG tube placement and is on tube feeds.  Recovery potential is unclear.  Palliative care following   Sepsis due to gram-negative UTI (HCC) Received 5 days of cefepime.  Back on  antibiotics as above   Rhabdomyolysis Treated with IV fluids during the hospital course   COVID-19 virus infection Supportive care.  No symptoms of active COVID infection.  Defer to infection prevention but likely can discontinue COVID isolation precautions   Decubital ulcer Coccyx stage II and anterior thigh stage II.  See full description below.  Local wound care, present on admission   Dyslipidemia Holding off cholesterol medication at this time.   Type 2 diabetes mellitus without complications (HCC) Sliding scale coverage.  Tube feeding changed to Glucerna.   Essential hypertension Patient on isosorbide dinitrate and as needed Lopressor Add back atenolol for improved blood pressure control.  25 mg twice daily for 2   DVT prophylaxis: SCD Code Status: Full Family Communication: Daughter Mickel Baas [phone] on 2/7, daughter [name] at bedside 2/8.  Daughter Mickel Baas at bedside 2/9.  Multiple family members at bedside 2/10 Disposition Plan: Status is: Inpatient Remains inpatient appropriate because: Severe encephalopathy post embolic CVA.  Status post retroperitoneal bleed  Level of care: Stepdown  Consultants:  Palliative care  Procedures:  None  Antimicrobials: None   Subjective: Seen and examined.  Remains encephalopathic.  Does not participate in interview.  Does not follow commands.  Objective: Vitals:   08/17/22 1200 08/17/22 1300 08/17/22 1314 08/17/22 1400  BP: (!) 104/42 (!) 121/43  (!) 111/36  Pulse: Marland Kitchen)  53 (!) 59 63 (!) 53  Resp: (!) 33 (!) 26 (!) 21 (!) 50  Temp: 97.7 F (36.5 C)     TempSrc: Axillary     SpO2: 97% 99% 100% 99%  Weight:      Height:        Intake/Output Summary (Last 24 hours) at 08/17/2022 1445 Last data filed at 08/17/2022 1200 Gross per 24 hour  Intake 1709.55 ml  Output 1600 ml  Net 109.55 ml   Filed Weights   08/15/22 0427 08/16/22 0350 08/17/22 0453  Weight: 71 kg 70 kg 70.1 kg    Examination:  General exam: Obtunded.   Intermittently agitated Respiratory system: Coarse breath sounds bilaterally.  Normal work of breathing.  2 L Cardiovascular system: S1-S2, bradycardic, no murmurs, no pedal edema Gastrointestinal system: Soft, NT/ND, normal bowel sounds, status post NG tube Central nervous system: Awake.  Oriented x 0.  Intermittently agitated Extremities: Unable to assess Skin: No rashes, lesions or ulcers Psychiatry: Unable to assess    Data Reviewed: I have personally reviewed following labs and imaging studies  CBC: Recent Labs  Lab 08/13/22 0455 08/14/22 0622 08/15/22 1059 08/15/22 2240 08/17/22 0823  WBC 11.1* 12.8* 14.5* 13.8* 10.7*  NEUTROABS  --   --  11.9* 11.5* 8.2*  HGB 10.2* 9.7* 9.7* 9.9* 9.3*  HCT 32.7* 30.7* 30.6* 32.1* 31.2*  MCV 97.6 94.8 95.6 96.1 99.0  PLT 204 264 243 281 [id number]   Basic Metabolic Panel: Recent Labs  Lab 08/12/22 1703 08/13/22 0455 08/14/22 0622 08/15/22 0344 08/15/22 1059 08/15/22 2240 08/16/22 0049 08/17/22 0823  NA  --  144 146*  --  145 145  --  153*  K  --  3.6 3.7  --  4.0 4.2  --  4.4  CL  --  111 111  --  106 107  --  113*  CO2  --  23 26  --  28 30  --  31  GLUCOSE  --  271* 221*  --  207* 197*  --  165*  BUN  --  34* 42*  --  69* 72*  --  86*  CREATININE  --  1.23 1.22  --  1.41* 1.78*  --  1.55*  CALCIUM  --  8.6* 8.9  --  9.3 9.4  --  9.0  MG 2.0 2.0 2.1 2.2  --   --  2.3  --   PHOS 2.2* 2.8 3.4 4.4  --   --  5.2*  --    GFR: Estimated Creatinine Clearance: 37.9 mL/min (A) (by C-G formula based on SCr of 1.55 mg/dL (H)). Liver Function Tests: Recent Labs  Lab 08/15/22 1059 08/15/22 2240  AST 52* 50*  ALT 58* 64*  ALKPHOS 84 100  BILITOT 1.2 1.1  PROT 6.7 7.0  ALBUMIN 2.6* 2.6*   No results for input(s): "LIPASE", "AMYLASE" in the last 168 hours. No results for input(s): "AMMONIA" in the last 168 hours. Coagulation Profile: No results for input(s): "INR", "PROTIME" in the last 168 hours. Cardiac Enzymes: No results for  input(s): "CKTOTAL", "CKMB", "CKMBINDEX", "TROPONINI" in the last 168 hours. BNP (last 3 results) No results for input(s): "PROBNP" in the last 8760 hours. HbA1C: No results for input(s): "HGBA1C" in the last 72 hours. CBG: Recent Labs  Lab 08/16/22 1940 08/17/22 0013 08/17/22 0441 08/17/22 0804 08/17/22 1232  GLUCAP 147* 171* 165* 140* 149*   Lipid Profile: No results for input(s): "CHOL", "HDL", "LDLCALC", "TRIG", "CHOLHDL", "  LDLDIRECT" in the last 72 hours. Thyroid Function Tests: No results for input(s): "TSH", "T4TOTAL", "FREET4", "T3FREE", "THYROIDAB" in the last 72 hours. Anemia Panel: No results for input(s): "VITAMINB12", "FOLATE", "FERRITIN", "TIBC", "IRON", "RETICCTPCT" in the last 72 hours. Sepsis Labs: Recent Labs  Lab 08/15/22 2240 08/16/22 0049 08/17/22 0421  PROCALCITON <0.10 <0.10 <0.10  LATICACIDVEN 1.9 1.5  --     Recent Results (from the past 240 hour(s))  MRSA Next Gen by PCR, Nasal     Status: None   Collection Time: 08/09/22 12:51 PM   Specimen: Nasal Mucosa; Nasal Swab  Result Value Ref Range Status   MRSA by PCR Next Gen NOT DETECTED NOT DETECTED Final    Comment: (NOTE) The GeneXpert MRSA Assay (FDA approved for NASAL specimens only), is one component of a comprehensive MRSA colonization surveillance program. It is not intended to diagnose MRSA infection nor to guide or monitor treatment for MRSA infections. Test performance is not FDA approved in patients less than 78 years old. Performed at [facility], [address] [place], [place] [address]   Culture, blood (Routine X 2) w Reflex to ID Panel     Status: None (Preliminary result)   Collection Time: 08/15/22 10:40 PM   Specimen: BLOOD  Result Value Ref Range Status   Specimen Description BLOOD LEFT AC  Final   Special Requests   Final    BOTTLES DRAWN AEROBIC AND ANAEROBIC Blood Culture results may not be optimal due to an excessive volume of blood received in culture  bottles   Culture   Final    NO GROWTH 2 DAYS Performed at [facility], [address] [place], [place] [address]    Report Status PENDING  Incomplete  Culture, blood (Routine X 2) w Reflex to ID Panel     Status: None (Preliminary result)   Collection Time: 08/15/22 10:40 PM   Specimen: BLOOD  Result Value Ref Range Status   Specimen Description BLOOD LEFT FA  Final   Special Requests   Final    BOTTLES DRAWN AEROBIC AND ANAEROBIC Blood Culture adequate volume   Culture   Final    NO GROWTH 2 DAYS Performed at [facility], [address] [place], [place] [address]    Report Status PENDING  Incomplete         Radiology Studies: CT HEAD WO CONTRAST (5MM)  Result Date: 08/16/2022 CLINICAL DATA:  Follow-up examination for stroke. Flattening mental status. EXAM: CT HEAD WITHOUT CONTRAST TECHNIQUE: Contiguous axial images were obtained from the base of the skull through the vertex without intravenous contrast. RADIATION DOSE REDUCTION: This exam was performed according to the departmental dose-optimization program which includes automated exposure control, adjustment of the mA and/or kV according to patient size and/or use of iterative reconstruction technique. COMPARISON:  Comparison made with prior CT and MRI from 08/09/2022. FINDINGS: Brain: Age-related cerebral atrophy with chronic small vessel ischemic disease. Few scatter remote lacunar infarcts present about the right basal ganglia. There has been continued interval evolution of the subacute right PCA distribution infarct, similar in size and distribution. Additional smaller areas of acute ischemia involving the thalami and right ACA territory are grossly stable as well. No significant regional mass effect. No evidence for hemorrhagic transformation by CT. No other new large vessel territory infarct. No acute intracranial hemorrhage. No mass lesion or midline shift. No hydrocephalus or extra-axial fluid collection.  Vascular: No abnormal hyperdense vessel. Scattered vascular calcifications noted within the carotid siphons.  Skull: Scalp soft tissues and calvarium demonstrate no acute finding. Sinuses/Orbits: Globes and orbital soft tissues within normal limits. Nasogastric tube in place. Scattered mucosal thickening present throughout the paranasal sinuses. No significant mastoid effusion. Other: None. IMPRESSION: 1. Continued interval evolution of subacute right PCA distribution infarct, similar in size and distribution. Additional smaller areas of acute ischemia involving the thalami and right ACA territory are grossly stable as well. No significant regional mass effect. No evidence for hemorrhagic transformation by CT. 2. No other new acute intracranial abnormality. 3. Underlying atrophy with chronic small vessel ischemic disease. Electronically Signed   By: Jeannine Boga M.D.   On: 08/16/2022 00:59   DG Chest Port 1 View  Result Date: 08/15/2022 CLINICAL DATA:  10027 Tachypnea 10027 EXAM: PORTABLE CHEST 1 VIEW COMPARISON:  Chest x-ray 07/15/2022 FINDINGS: Enteric tube coursing below the diaphragm with tip and side port overlying the expected region of the gastric lumen. The heart and mediastinal contours are within normal limits. Aortic calcification. Low lung volumes. Left base airspace opacity likely atelectasis. No pulmonary edema. No pleural effusion. No pneumothorax. No acute osseous abnormality. IMPRESSION: 1. Low lung volumes with left base airspace opacities - likely atelectasis. Superimposed infection undulation cannot be fully excluded. 2.  Aortic Atherosclerosis (ICD10-I70.0). Electronically Signed   By: Iven Finn M.D.   On: 08/15/2022 22:26        Scheduled Meds:  sodium chloride   Intravenous Once   vitamin C  500 mg Per Tube BID   atenolol  25 mg Per Tube BID   Chlorhexidine Gluconate Cloth  6 each Topical Daily   famotidine  20 mg Per Tube Daily   free water  200 mL Per Tube Q6H    insulin aspart  0-9 Units Subcutaneous Q4H   insulin aspart  3 Units Subcutaneous Q4H   isosorbide dinitrate  20 mg Per Tube TID   nutrition supplement (JUVEN)  1 packet Per Tube BID BM   mouth rinse  15 mL Mouth Rinse 4 times per day   polyethylene glycol  17 g Per Tube Daily   traZODone  25 mg Per Tube QHS   zinc sulfate  220 mg Per Tube Daily   Continuous Infusions:  ampicillin-sulbactam (UNASYN) IV 3 g (08/17/22 1439)   feeding supplement (GLUCERNA 1.5 CAL) 35 mL/hr at 08/17/22 1200   [START ON 08/13/2022] vancomycin       LOS: 17 days     Sidney Ace, MD [facility]   If 7PM-7AM, please contact night-coverage  08/17/2022, 2:45 PM

## 2022-08-17 NOTE — Consult Note (Signed)
Pharmacy Antibiotic Note  Russell Byrd is a 77 y.o. male with PMH including Afib, DM admitted on 07/26/2022 with  urosepsis secondary to Enterobacter aerogenes UTI . Patient also tested positive for COVID-19 infection. Patient received cefepime 1/26 - 1/31 for targeted treatment of Enterobacter aerogenes. Patient was placed back on antibiotics with Unasyn 2/9 for new fever. Fever persistent and now adding vancomycin. Pharmacy has been consulted for vancomycin dosing. Patient is also ordered Unasyn.  Plan:  Vancomycin 1.5 g IV LD followed by maintenance regimen of vancomycin 1.25 g IV q36h --Calculated AUC: 460, Cmin: 9.9 --Daily Scr per protocol --Levels at steady state or as clinically indicated  Height: 5' 7"$  (170.2 cm) Weight: 70.1 kg (154 lb 8.7 oz) IBW/kg (Calculated) : 66.1  Temp (24hrs), Avg:99.4 F (37.4 C), Min:97.6 F (36.4 C), Max:101.3 F (38.5 C)  Recent Labs  Lab 08/12/22 0322 08/13/22 0455 08/14/22 0622 08/15/22 1059 08/15/22 2240 08/16/22 0049  WBC 11.8* 11.1* 12.8* 14.5* 13.8*  --   CREATININE 1.20 1.23 1.22 1.41* 1.78*  --   LATICACIDVEN  --   --   --   --  1.9 1.5    Estimated Creatinine Clearance: 33 mL/min (A) (by C-G formula based on SCr of 1.78 mg/dL (H)).    Allergies  Allergen Reactions   Amlodipine Swelling   Flomax [Tamsulosin] Swelling    Pt states swelling to face and swelling to lower extremities   Hydrochlorothiazide Other (See Comments)    Urinary frequency   Prednisone Other (See Comments)    steroids   Tetracycline     Other reaction(s): Unknown   Tetracyclines & Related    Tramadol Other (See Comments)    Keeps him awake    Antimicrobials this admission: Ceftriaxone 1/24 >> 1/25 Cefepime 1/26 >> 1/31 Unasyn 2/9 >>  Vancomycin 2/10 >>   Dose adjustments this admission: N/A  Microbiology results: 1/24 BCx: NG 1/24 UCx: Enterobacter aerogenes 1/27 SARS-CoV-2: (+) 2/2 MRSA PCR: (-) 2/8 BCx: NGTD  Thank you for  allowing pharmacy to be a part of this patient's care.  Benita Gutter 08/17/2022 7:58 AM

## 2022-08-18 DIAGNOSIS — I6340 Cerebral infarction due to embolism of unspecified cerebral artery: Secondary | ICD-10-CM | POA: Diagnosis not present

## 2022-08-18 LAB — CBC WITH DIFFERENTIAL/PLATELET
Abs Immature Granulocytes: 0.05 10*3/uL (ref 0.00–0.07)
Basophils Absolute: 0 10*3/uL (ref 0.0–0.1)
Basophils Relative: 1 %
Eosinophils Absolute: 0.3 10*3/uL (ref 0.0–0.5)
Eosinophils Relative: 4 %
HCT: 28.9 % — ABNORMAL LOW (ref 39.0–52.0)
Hemoglobin: 8.9 g/dL — ABNORMAL LOW (ref 13.0–17.0)
Immature Granulocytes: 1 %
Lymphocytes Relative: 12 %
Lymphs Abs: 0.9 10*3/uL (ref 0.7–4.0)
MCH: 29.8 pg (ref 26.0–34.0)
MCHC: 30.8 g/dL (ref 30.0–36.0)
MCV: 96.7 fL (ref 80.0–100.0)
Monocytes Absolute: 0.6 10*3/uL (ref 0.1–1.0)
Monocytes Relative: 7 %
Neutro Abs: 5.8 10*3/uL (ref 1.7–7.7)
Neutrophils Relative %: 75 %
Platelets: 184 10*3/uL (ref 150–400)
RBC: 2.99 MIL/uL — ABNORMAL LOW (ref 4.22–5.81)
RDW: 13.5 % (ref 11.5–15.5)
WBC: 7.7 10*3/uL (ref 4.0–10.5)
nRBC: 0 % (ref 0.0–0.2)

## 2022-08-18 LAB — BASIC METABOLIC PANEL
Anion gap: 9 (ref 5–15)
BUN: 79 mg/dL — ABNORMAL HIGH (ref 8–23)
CO2: 29 mmol/L (ref 22–32)
Calcium: 8.8 mg/dL — ABNORMAL LOW (ref 8.9–10.3)
Chloride: 116 mmol/L — ABNORMAL HIGH (ref 98–111)
Creatinine, Ser: 1.56 mg/dL — ABNORMAL HIGH (ref 0.61–1.24)
GFR, Estimated: 46 mL/min — ABNORMAL LOW (ref >60)
Glucose, Bld: 128 mg/dL — ABNORMAL HIGH (ref 70–99)
Potassium: 3.9 mmol/L (ref 3.5–5.1)
Sodium: 154 mmol/L — ABNORMAL HIGH (ref 135–145)

## 2022-08-18 LAB — CREATININE, SERUM
Creatinine, Ser: 1.53 mg/dL — ABNORMAL HIGH (ref 0.61–1.24)
GFR, Estimated: 47 mL/min — ABNORMAL LOW (ref >60)

## 2022-08-18 LAB — GLUCOSE, CAPILLARY
Glucose-Capillary: 112 mg/dL — ABNORMAL HIGH (ref 70–99)
Glucose-Capillary: 127 mg/dL — ABNORMAL HIGH (ref 70–99)
Glucose-Capillary: 131 mg/dL — ABNORMAL HIGH (ref 70–99)
Glucose-Capillary: 133 mg/dL — ABNORMAL HIGH (ref 70–99)

## 2022-08-18 LAB — VANCOMYCIN, RANDOM: Vancomycin Rm: 10 ug/mL

## 2022-08-18 MED ORDER — VANCOMYCIN HCL IN DEXTROSE 1-5 GM/200ML-% IV SOLN
1000.0000 mg | Freq: Once | INTRAVENOUS | Status: DC
  Filled 2022-08-18: qty 200

## 2022-08-18 MED ORDER — HYDROMORPHONE HCL-NACL 50-0.9 MG/50ML-% IV SOLN
0.5000 mg/h | INTRAVENOUS | Status: DC
  Administered 2022-08-18: 0.5 mg/h via INTRAVENOUS
  Filled 2022-08-18: qty 50

## 2022-08-18 MED ORDER — DEXTROSE-NACL 5-0.45 % IV SOLN: INTRAVENOUS | Status: DC

## 2022-08-18 MED ORDER — DEXMEDETOMIDINE HCL IN NACL 400 MCG/100ML IV SOLN
0.0000 ug/kg/h | INTRAVENOUS | Status: DC
  Administered 2022-08-18: 0.2 ug/kg/h via INTRAVENOUS
  Administered 2022-08-18: 1.2 ug/kg/h via INTRAVENOUS
  Filled 2022-08-18 (×3): qty 100

## 2022-08-18 MED ORDER — MORPHINE 100MG IN NS 100ML (1MG/ML) PREMIX INFUSION
INTRAVENOUS | Status: AC
  Administered 2022-08-18: 1 mg/h via INTRAVENOUS
  Filled 2022-08-18: qty 100

## 2022-08-18 MED ORDER — SCOPOLAMINE 1 MG/3DAYS TD PT72
1.0000 | MEDICATED_PATCH | TRANSDERMAL | Status: DC
  Administered 2022-08-18: 1.5 mg via TRANSDERMAL
  Filled 2022-08-18: qty 1

## 2022-08-18 MED ORDER — MORPHINE 100MG IN NS 100ML (1MG/ML) PREMIX INFUSION: 2.0000 mg/h | INTRAVENOUS | Status: DC

## 2022-08-18 NOTE — Progress Notes (Signed)
Approached by nursing staff requesting Palliative visit with Russell Byrd/daughter at bedside. Russell Byrd with questions regarding transition to comfort measures and process. Russell Byrd continues to work on obtaining [name] living will-planning on calling [place] Monday morning.  Discussed the process of transitioning to full comfort measures. Russell Byrd desires time to contact family members and allow them time to visit.  Explained PMT to continue to be available for ongoing support and needs.  No Charge.  [name], DNP, AGNP-C Palliative Medicine  Please call Palliative Medicine team phone with any questions [phone]. For individual providers please see AMION.

## 2022-08-18 NOTE — Progress Notes (Signed)
Patient was noted to be restless, anxious, and agitated; Dr. Priscella Mann updated and placed order for precedex.

## 2022-08-18 NOTE — Progress Notes (Signed)
Patient pulled NG tube out, daughter at bedside and Dr. Priscella Mann notified.  Per Dr. Priscella Mann patient is too agitated and restless at this time to safely place another NG tube, will continue to monitor patient.

## 2022-08-18 NOTE — Progress Notes (Signed)
Since initiation of precedex patient continued with restlessness and grimacing in pain, Dr. Priscella Mann notified and morphine gtt started. Patient continued with intermittent restlessness and pain despite the combination of precedex and morphine, Dr. Priscella Mann notified and came to bedside.  Patient's daughter is at bedside with patient as well, Dr. Priscella Mann ordered for increase in morphine gtt and labs.  Will continue to monitor.

## 2022-08-18 NOTE — Plan of Care (Signed)
Continuing with plan of care. 

## 2022-08-18 NOTE — Progress Notes (Addendum)
Daughter is at bedside and desires for patient to be kept comfortable at this time and defers for patient to have NG replaced, any additional IV fluids to be added, antibiotics, blood sugar checks, b/p, and temperature checks at this time in efforts to keep patient comfortable. Patient's daughter is in the process of moving forward with comfort care once additional family members are reached out too and would like for the patient to not be uncomfortable while pursuing family updates.  Palliative NP also spoke with the patient and gave support.  Dr. Priscella Mann notified of the families desires as well including what they would like to have deferred for the time being.  Daughter voiced peace if the patient did pass away, before official comfort care orders are placed, in the instance family communication and documentation is still underway. Patient did become uncomfortable at approximately 1645 and exhibited grimacing and gurgling, Dr. Priscella Mann notified and ordered scopolamine patch and to change morphine drip to dilaudid drip.  Patient received mouth care and moisturizer as well and daughter was comfortable with [place] repositioning the patient while he was awake.  Will continue to monitor and endorse to oncoming shift.

## 2022-08-18 NOTE — Progress Notes (Signed)
PROGRESS NOTE    Russell Byrd  [id number] DOB: 01-18-1946 DOA: 08/07/2022 PCP: Valerie Roys, DO    Brief Narrative:  77 year old male with past medical history of paroxysmal atrial fibrillation, diabetes mellitus who is into the emergency room on 1/24 with confusion and fall and had been on the floor for 3 days prior to being found.  In the emergency room, patient found to have sepsis secondary to Enterobacter UTI as well as elevated.  Admitted to the hospitalist service and started on IV cefepime.  Troponins continue to trend upward and patient felt to have a non-STEMI.  Initial plan was for cardiac catheterization, but was delayed due to patient having some acute hospital delirium, likely in the setting of underlying dementia.  On 1/27, patient testing positive for COVID.   2/1, patient more confused, with decreased level of responsiveness.  Patient's hemoglobin down to 6.8 (was 8.3 the day prior) with creatinine at 1.5, previously normal.  White blood cell count elevated, but infection workup negative.  Patient ordered 1 unit packed red blood cells.  Heparin stopped at 8 AM.   2/2: Patient about the same, now with facial droop.  Stat CT scan of head done this morning noting 2 areas of low-density in right lower occipital temporal lobe and lower right frontal lobe concerning for acute/subacute infarcts.  In addition, despite 1 unit packed red blood cell transfusion on previous day, hemoglobin down to 6.5 and creatinine up further to 2.3.  Patient ordered 2 more units packed red blood cells.  By late morning, patient developed rapid atrial fibrillation and started on Cardizem drip and transferred to stepdown unit.  GI consulted, recommending conservative measures.   2/3: MRI done night prior confirms multiple areas of embolic CVA.  Neurology feels likely occurred secondary to atrial fibs despite heparin infusion.  Atrial fibrillation converted back to normal sinus rhythm.  CT of  abdomen/pelvis 2/3 notes small intramuscular hematoma within the left pelvic sidewall and small amount of right retroperitoneal hemorrhage with left retroperitoneal hematoma approximately 940 cc of blood.  Vascular surgery recommended consulting interventional radiology for possible intervention.   2/4: NG tube placed and tube feeds being initiated.  Patient with little change in mentation.  Renal function continues to improve although hemoglobin slightly lower.   2/5: Renal function and white blood cell count almost fully normalized.  Hemoglobin trending upward.  Patient seen by interventional radiology who recommended no intervention at this time.  Patient's BNP trending upward, at 900 today.  Restarted on Lasix with IV albumin added.  Mentation unchanged.   2/6.  Patient able to lift up his arms off the bed.  Weaker with his legs.  Unable to talk.  Advancing gastric tube.  Trying to get tube feeds up to goal.  Palliative care consultation  2/7: Patient unable to engage in conversation.  Unsafe for p.o. intake at this time.  Palliative consult requested yesterday currently pending.  2/8: No appreciable status changes.  Youngest daughter and patient room during my rounds this morning.  Attempted to get family member to interact with patient.  Minimal interaction.  Patient does open his eyes.  Does not track.  Does not obviously follow commands.  2/9: Deterioration in mental status noted overnight.  Unable to arouse even with noxious stimulus.  Workup overall unrevealing.  Did have a mild low-grade temperature.  Infectious workup has been initiated.  2/10: No appreciable change in mental status.  At times agitated.  Recurrent fever noted to  102 last night.  2/11: Patient more agitated this morning.  Precedex drip initiated and titrated up to max.  Given persistent agitation morphine gtt. was initiated.  Discussed with family.  Considering transition to comfort measures.   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 recurrent fever again on 2/10.  Unclear etiology.  Difficult to exclude infectious etiology however negative procalcitonin with normal white blood cell count speaks away from this.  That being said this is one of the potentially reversible causes that we can identify and address.  Blood and urine cultures taken.   Plan: Continue Unasyn Continue vancomycin Monitor cultures, no growth to date Monitor vitals and fever curve  Goals of care I had an extended conversation with multiple family members at bedside on 2/10.  Explained that patient's mental status precluded any advancement in therapy and recovery potential is unknown.  Also detailed the new onset fever and likely poor prognosis this carries.  All questions answered to family satisfaction.  Patient's CODE STATUS changed to DNR. 2/11: Patient remains agitated despite escalating Precedex and morphine gtt.  Recovery potential greatly in question.  Guarded prognosis.  Family considering transition to comfort measures.  Patient has now self discontinued his NG tube.  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  Seen by neurology.  Currently unable to give anticoagulation with retroperitoneal bleed.  Supportive care.  Neurology following as needed.  Recovery potential unclear at this time.  Very guarded prognosis   Retroperitoneal bleed Anticoagulation on hold.  Hemoglobin stable.  No intervention per IR   NSTEMI (non-ST elevated  myocardial infarction) ([place]) No plans for ischemic evaluation   Paroxysmal atrial fibrillation with RVR ([place]) Unable to give anticoagulation with retroperitoneal bleed.   Delirium Given obtunded state is difficult to distinguish delirium.  Currently the patient is status post NG tube placement and is on tube feeds.  Recovery potential is unclear.  Palliative care following   Sepsis due to gram-negative UTI (HCC) Received 5 days of cefepime.  Back on antibiotics as above   Rhabdomyolysis Treated with IV fluids during the hospital course   COVID-19 virus infection Supportive care.  No symptoms of active COVID infection.  Defer to infection prevention but likely can discontinue COVID isolation precautions   Decubital ulcer Coccyx stage II and anterior thigh stage II.  See full description below.  Local wound care, present on admission   Dyslipidemia Holding off cholesterol medication at this time.   Type 2 diabetes mellitus without complications (HCC) Sliding scale coverage.  Tube feeding changed to Glucerna.   Essential hypertension Patient on isosorbide dinitrate and as needed Lopressor Add back atenolol for improved blood pressure control.  25 mg twice daily for 2   DVT prophylaxis: SCD Code Status: Full Family Communication: Daughter Mickel Baas [phone] on 2/7, daughter [name] at bedside 2/8.  Daughter Mickel Baas at bedside 2/9.  Multiple family members at bedside 2/10, daughter Mickel Baas bedside 2/11 Disposition Plan: Status is: Inpatient Remains inpatient appropriate  because: Severe encephalopathy post embolic CVA.  Status post retroperitoneal bleed.  Guarded prognosis.  Level of care: Stepdown  Consultants:  Palliative care  Procedures:  None  Antimicrobials: None   Subjective: Seen and examined.  Remains encephalopathic.  Does not participate in interview.  Does not follow commands.  Agitated today  Objective: Vitals:   08/28/2022 0800 08/30/2022 0900 08/25/2022 1000  08/31/2022 1100  BP: (!) 128/40 132/63 108/75 (!) 104/39  Pulse: (!) 53 62 61 (!) 54  Resp: (!) 24 (!) 26 (!) 22 (!) 29  Temp: 99.2 F (37.3 C)     TempSrc: Oral     SpO2: 95% 99% 97% 97%  Weight:      Height:        Intake/Output Summary (Last 24 hours) at 08/26/2022 1413 Last data filed at 08/24/2022 1200 Gross per 24 hour  Intake 3081.89 ml  Output 1100 ml  Net 1981.89 ml   Filed Weights   08/16/22 0350 08/17/22 0453 08/31/2022 0447  Weight: 70 kg 70.1 kg 70.1 kg    Examination:  General exam: Obtunded.  Intermittently agitated Respiratory system: Coarse breath sounds bilaterally.  Normal work of breathing.  2 L Cardiovascular system: S1-S2, bradycardic, no murmurs, no pedal edema Gastrointestinal system: Soft, NT/ND, normal bowel sounds, status post NG tube Central nervous system: Awake.  Oriented x 0.  Intermittently agitated Extremities: Unable to assess Skin: No rashes, lesions or ulcers Psychiatry: Unable to assess    Data Reviewed: I have personally reviewed following labs and imaging studies  CBC: Recent Labs  Lab 08/14/22 0622 08/15/22 1059 08/15/22 2240 08/17/22 0823 08/22/2022 1209  WBC 12.8* 14.5* 13.8* 10.7* 7.7  NEUTROABS  --  11.9* 11.5* 8.2* 5.8  HGB 9.7* 9.7* 9.9* 9.3* 8.9*  HCT 30.7* 30.6* 32.1* 31.2* 28.9*  MCV 94.8 95.6 96.1 99.0 96.7  PLT 264 243 281 196 [id number]   Basic Metabolic Panel: Recent Labs  Lab 08/12/22 1703 08/13/22 0455 08/14/22 0622 08/15/22 0344 08/15/22 1059 08/15/22 2240 08/16/22 0049 08/17/22 0823 08/09/2022 0446 08/09/2022 1209  NA  --  144 146*  --  145 145  --  153*  --  154*  K  --  3.6 3.7  --  4.0 4.2  --  4.4  --  3.9  CL  --  111 111  --  106 107  --  113*  --  116*  CO2  --  23 26  --  28 30  --  31  --  29  GLUCOSE  --  271* 221*  --  207* 197*  --  165*  --  128*  BUN  --  34* 42*  --  69* 72*  --  86*  --  79*  CREATININE  --  1.23 1.22  --  1.41* 1.78*  --  1.55* 1.53* 1.56*  CALCIUM  --  8.6* 8.9  --  9.3  9.4  --  9.0  --  8.8*  MG 2.0 2.0 2.1 2.2  --   --  2.3  --   --   --   PHOS 2.2* 2.8 3.4 4.4  --   --  5.2*  --   --   --    GFR: Estimated Creatinine Clearance: 37.7 mL/min (A) (by C-G formula based on SCr of 1.56 mg/dL (H)). Liver Function Tests: Recent Labs  Lab 08/15/22 1059 08/15/22 2240  AST 52* 50*  ALT 58* 64*  ALKPHOS 84 100  BILITOT  1.2 1.1  PROT 6.7 7.0  ALBUMIN 2.6* 2.6*   No results for input(s): "LIPASE", "AMYLASE" in the last 168 hours. No results for input(s): "AMMONIA" in the last 168 hours. Coagulation Profile: No results for input(s): "INR", "PROTIME" in the last 168 hours. Cardiac Enzymes: No results for input(s): "CKTOTAL", "CKMB", "CKMBINDEX", "TROPONINI" in the last 168 hours. BNP (last 3 results) No results for input(s): "PROBNP" in the last 8760 hours. HbA1C: No results for input(s): "HGBA1C" in the last 72 hours. CBG: Recent Labs  Lab 08/17/22 1952 08/24/2022 0025 08/21/2022 0427 08/28/2022 0754 09/03/2022 1123  GLUCAP 146* 127* 133* 131* 112*   Lipid Profile: No results for input(s): "CHOL", "HDL", "LDLCALC", "TRIG", "CHOLHDL", "LDLDIRECT" in the last 72 hours. Thyroid Function Tests: No results for input(s): "TSH", "T4TOTAL", "FREET4", "T3FREE", "THYROIDAB" in the last 72 hours. Anemia Panel: No results for input(s): "VITAMINB12", "FOLATE", "FERRITIN", "TIBC", "IRON", "RETICCTPCT" in the last 72 hours. Sepsis Labs: Recent Labs  Lab 08/15/22 2240 08/16/22 0049 08/17/22 0421  PROCALCITON <0.10 <0.10 <0.10  LATICACIDVEN 1.9 1.5  --     Recent Results (from the past 240 hour(s))  MRSA Next Gen by PCR, Nasal     Status: None   Collection Time: 08/09/22 12:51 PM   Specimen: Nasal Mucosa; Nasal Swab  Result Value Ref Range Status   MRSA by PCR Next Gen NOT DETECTED NOT DETECTED Final    Comment: (NOTE) The GeneXpert MRSA Assay (FDA approved for NASAL specimens only), is one component of a comprehensive MRSA colonization  surveillance program. It is not intended to diagnose MRSA infection nor to guide or monitor treatment for MRSA infections. Test performance is not FDA approved in patients less than 98 years old. Performed at [facility], [address] [place], [place] [address]   Culture, blood (Routine X 2) w Reflex to ID Panel     Status: None (Preliminary result)   Collection Time: 08/15/22 10:40 PM   Specimen: BLOOD  Result Value Ref Range Status   Specimen Description BLOOD LEFT AC  Final   Special Requests   Final    BOTTLES DRAWN AEROBIC AND ANAEROBIC Blood Culture results may not be optimal due to an excessive volume of blood received in culture bottles   Culture   Final    NO GROWTH 3 DAYS Performed at [facility], [address], [place], [place] [address]    Report Status PENDING  Incomplete  Culture, blood (Routine X 2) w Reflex to ID Panel     Status: None (Preliminary result)   Collection Time: 08/15/22 10:40 PM   Specimen: BLOOD  Result Value Ref Range Status   Specimen Description BLOOD LEFT FA  Final   Special Requests   Final    BOTTLES DRAWN AEROBIC AND ANAEROBIC Blood Culture adequate volume   Culture   Final    NO GROWTH 3 DAYS Performed at [facility], [address], [place], [place] [address]    Report Status PENDING  Incomplete         Radiology Studies: No results found.      Scheduled Meds:  sodium chloride   Intravenous Once   vitamin C  500 mg Per Tube BID   atenolol  25 mg Per Tube BID   Chlorhexidine Gluconate Cloth  6 each Topical Daily   famotidine  20 mg Per Tube Daily   free water  200 mL Per Tube Q6H   insulin aspart  0-9 Units Subcutaneous  Q4H   insulin aspart  3 Units Subcutaneous Q4H   isosorbide dinitrate  20 mg Per Tube TID   nutrition supplement (JUVEN)  1 packet Per Tube BID BM   mouth rinse  15 mL Mouth Rinse 4 times per day   polyethylene glycol  17 g Per Tube Daily   traZODone  25 mg Per Tube QHS    zinc sulfate  220 mg Per Tube Daily   Continuous Infusions:  ampicillin-sulbactam (UNASYN) IV Stopped (09/03/2022 0944)   dexmedetomidine (PRECEDEX) IV infusion 1.2 mcg/kg/hr (08/16/2022 1200)   dextrose 5 % and 0.45% NaCl     feeding supplement (GLUCERNA 1.5 CAL) 35 mL/hr at 09/02/2022 1200   morphine 2 mg/hr (08/14/2022 1200)   vancomycin       LOS: 18 days     Sidney Ace, MD [facility]   If 7PM-7AM, please contact night-coverage  08/27/2022, 2:13 PM

## 2022-08-18 NOTE — Progress Notes (Signed)
Pt restless & desaturated to 78% on 4LNC w/ a HR of 28-35bpm with RN at bedside. Pt coughed & has audible congestion and was orally suctioned-  Pt appears calmer with HR 36bpm with O2 at 93% 4LNC. Pt turned at this time.

## 2022-08-18 NOTE — Progress Notes (Signed)
Per central tele pt HR dropped to 30's around 2027 & asystole alarmed at 20:28 & 21:29. Pt's daughter Mickel Baas called by Probation officer at 2135 to notify her of changes in pt's status.   Pt appears in NAD at this time however there are noted periods of oxygen desaturation from 91% on 3L to 86%. Oxygen increased to [facility] for comfort- pt appears air hungry and oxygen increased to 91%. Pt also intermittently tachypneic 32-46 RR/min. Medication titrated for comfort. BP 111/50 (map 67) at this time and HR is now 67bpm as of 9:39 PM.   Central tele and surrounding staff notified pt is DNR with orders to keep comfortable per family. No additional questions or concerns posed to this RN during call with pt's daughter Mickel Baas

## 2022-08-18 NOTE — Progress Notes (Signed)
Neomia Glass NP at bedside for assessment, Probation officer at bedside for witness.  TOD called @ 22:56pm. Family at bedside

## 2022-08-18 NOTE — Significant Event (Signed)
       CROSS COVER NOTE  NAME: CELSO GRANJA MRN: [id number] DOB : 07/21/1945 ATTENDING PHYSICIAN: Sidney Ace, MD    I was called to patient's bedside to pronounce that patient Mr Barmore has died. No spontaneous movements were present. There was no response to verbal or tactile stimuli. Pupils were mid-dilated and fixed. No breath sounds were appreciated over either lung field. No carotid or femoral pulses were palpable. No heart sounds were auscultated over the entire precordium. Patient pronounced dead at 02-Sep-2022 2254/10/03 by myself and witnessed by patient's primary RN. Family at bedside. Chaplain and postmortem services offered. Patient was DNR/DNI at the time of death.   This document was prepared using Dragon voice recognition software and may include unintentional dictation errors.  Neomia Glass DNP, MBA, FNP-BC Nurse Practitioner [facility] Pager [phone]

## 2022-08-18 NOTE — Consult Note (Signed)
Pharmacy Antibiotic Note  Russell Byrd is a 77 y.o. male with PMH including Afib, DM admitted on 07/16/2022 with  urosepsis secondary to Enterobacter aerogenes UTI . Patient also tested positive for COVID-19 infection. Patient received cefepime 1/26 - 1/31 for targeted treatment of Enterobacter aerogenes. Patient was placed back on antibiotics with Unasyn 2/9 for new fever. Fever persistent and now adding vancomycin. Pharmacy has been consulted for vancomycin dosing. Patient is also ordered Unasyn.  Plan: Vancomycin 1.5 g IV loading dose given and vancomycin random was ordered and resulted at 10. Will give vancomycin 1g x 1 and order a vancomycin random ~ 24 hour while pt is in AKI. Baseline Scr 1.2?   Height: 5' 7"$  (170.2 cm) Weight: 70.1 kg (154 lb 8.7 oz) IBW/kg (Calculated) : 66.1  Temp (24hrs), Avg:99.3 F (37.4 C), Min:99 F (37.2 C), Max:99.7 F (37.6 C)  Recent Labs  Lab 08/14/22 0622 08/15/22 1059 08/15/22 2240 08/16/22 0049 08/17/22 0823 08/24/2022 0446 09/02/2022 1209  WBC 12.8* 14.5* 13.8*  --  10.7*  --  7.7  CREATININE 1.22 1.41* 1.78*  --  1.55* 1.53* 1.56*  LATICACIDVEN  --   --  1.9 1.5  --   --   --   VANCORANDOM  --   --   --   --   --   --  10     Estimated Creatinine Clearance: 37.7 mL/min (A) (by C-G formula based on SCr of 1.56 mg/dL (H)).    Allergies  Allergen Reactions   Amlodipine Swelling   Flomax [Tamsulosin] Swelling    Pt states swelling to face and swelling to lower extremities   Hydrochlorothiazide Other (See Comments)    Urinary frequency   Prednisone Other (See Comments)    steroids   Tetracycline     Other reaction(s): Unknown   Tetracyclines & Related    Tramadol Other (See Comments)    Keeps him awake    Antimicrobials this admission: Ceftriaxone 1/24 >> 1/25 Cefepime 1/26 >> 1/31 Unasyn 2/9 >>  Vancomycin 2/10 >>   Dose adjustments this admission: N/A  Microbiology results: 1/24 BCx: NG 1/24 UCx: Enterobacter  aerogenes 1/27 SARS-CoV-2: (+) 2/2 MRSA PCR: (-) 2/8 BCx: NGTD  Thank you for allowing pharmacy to be a part of this patient's care.  Oswald Hillock, PharmD, BCPS 08/25/2022 12:49 PM

## 2022-08-20 LAB — CULTURE, BLOOD (ROUTINE X 2)
Culture: NO GROWTH
Culture: NO GROWTH
Special Requests: ADEQUATE

## 2022-08-26 ENCOUNTER — Ambulatory Visit: Payer: PPO | Admitting: Family Medicine

## 2022-09-02 ENCOUNTER — Ambulatory Visit: Payer: PPO | Admitting: Family Medicine

## 2022-09-06 NOTE — Discharge Summary (Signed)
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[facility]) Active Problems:   Retroperitoneal bleed   Paroxysmal atrial fibrillation with RVR ([place])   NSTEMI (non-ST elevated myocardial infarction) ([place])   Delirium   Sepsis due to gram-negative UTI ([place])   Rhabdomyolysis   Essential hypertension   Type 2 diabetes mellitus without complications ([place])   Dyslipidemia   Sepsis secondary to UTI ([place])   Decubital ulcer   COVID-19 virus infection   Hospital Course: 77 year old male with past medical history of paroxysmal atrial fibrillation, diabetes mellitus who is into the emergency room on 2022/08/29 with confusion and fall and had been on the floor for 3 days prior to being found.  In the emergency room, patient found to have sepsis secondary to Enterobacter UTI as well as elevated.  Admitted to the hospitalist service and started on IV cefepime.  Troponins continue to trend upward and patient felt to have a non-STEMI.  Initial plan was for cardiac catheterization, but was delayed due to patient having some acute hospital delirium, likely in the setting of underlying dementia.  On 1/27, patient testing positive for COVID.   2/1, patient more confused, with decreased level of responsiveness.  Patient's hemoglobin down to 6.8 (was 8.3 the day prior) with creatinine at 1.5, previously normal.  White blood cell count elevated, but infection workup negative.  Patient ordered 1 unit packed red blood cells.  Heparin stopped at 8 AM.   2/2: Patient about the same, now with facial droop.  Stat CT scan of head done this morning noting 2 areas of low-density in right lower occipital temporal  lobe and lower right frontal lobe concerning for acute/subacute infarcts.  In addition, despite 1 unit packed red blood cell transfusion on previous day, hemoglobin down to 6.5 and creatinine up further to 2.3.  Patient ordered 2 more units packed red blood cells.  By late morning, patient developed rapid atrial fibrillation and started on Cardizem drip and transferred to stepdown unit.  GI consulted, recommending conservative measures.  2/3: MRI done night prior confirms multiple areas of embolic CVA.  Neurology feels likely occurred secondary to atrial fibs despite heparin infusion.  Atrial fibrillation converted back to normal sinus rhythm.  CT of abdomen/pelvis 2/3 notes small intramuscular hematoma within the left pelvic sidewall and small amount of right retroperitoneal hemorrhage with left retroperitoneal hematoma approximately 940 cc of blood.  Vascular surgery recommended consulting interventional radiology for possible intervention.   2/4: NG tube placed and tube feeds being initiated.  Patient with little change in mentation.  Renal function continues to improve although hemoglobin slightly lower.   2/5: Renal function and white blood cell count almost fully normalized.  Hemoglobin trending upward.  Patient seen by interventional radiology who recommended no intervention at this time.  Patient's BNP trending upward, at 900 today.  Restarted on Lasix with IV albumin added.  Mentation unchanged.  2/6.  Patient able to lift up his arms off the bed.  Weaker with his legs.  Unable to talk.  Advancing gastric tube.  Trying to get tube feeds up to goal.  Palliative care consultation.  2/1 11: Patient's clinical status has  declined.  More agitated.  Remained in pain despite escalating rates of Precedex and narcotic gtt..  Several conversations with patient's family members.  Explained our clinical situation and poor chance of meaningful neurologic recovery.  Family understanding.  Patient was not made  full comfort measures however per family's request we had avoided any escalation of care.  Per medical documentation patient passed away at 2256 on 17-Sep-2022.  Family at bedside.  Condolences to family.  Assessment and Plan: * Embolic stroke [facility]) Multiple embolic strokes seen on MRI of the brain.  Seen by neurology.  Currently unable to give anticoagulation with retroperitoneal bleed.  Supportive care.  Retroperitoneal bleed Anticoagulation on hold.  Hemoglobin better than 2 days ago up at 10.2.  NSTEMI (non-ST elevated myocardial infarction) ([place]) Patient's mental status will have to be better prior to any procedure.  Paroxysmal atrial fibrillation with RVR ([facility]) Unable to give anticoagulation with retroperitoneal bleed.  Delirium Acute delirium.  Continue to monitor.  Spoke with patient's daughter that everything will key off the mental status.  Palliative care consultation to discuss goals of care.  Continue tube feedings.  Sepsis due to gram-negative UTI (H[facility]) Received 5 days of cefepime.  Patient had Enterobacter in the urine culture.  Rhabdomyolysis Treated with IV fluids during the hospital course  COVID-19 virus infection Supportive care.  Should be able to come out of COVID isolation tomorrow.  Decubital ulcer Coccyx stage II and anterior thigh stage II.  See full description below.  Local wound care, present on admission  Dyslipidemia Holding off cholesterol medication at this time.  Type 2 diabetes mellitus without complications (HCC) Sliding scale coverage.  Tube feeding changed to Glucerna.  Essential hypertension Patient on isosorbide dinitrate and as needed Lopressor         Procedures: None  Consultations: Neurology, palliative care  The results of significant diagnostics from this hospitalization (including imaging, microbiology, ancillary and laboratory) are listed below for reference.   Significant Diagnostic Studies: CT HEAD WO CONTRAST  (5MM)  Result Date: 08/16/2022 CLINICAL DATA:  Follow-up examination for stroke. Flattening mental status. EXAM: CT HEAD WITHOUT CONTRAST TECHNIQUE: Contiguous axial images were obtained from the base of the skull through the vertex without intravenous contrast. RADIATION DOSE REDUCTION: This exam was performed according to the departmental dose-optimization program which includes automated exposure control, adjustment of the mA and/or kV according to patient size and/or use of iterative reconstruction technique. COMPARISON:  Comparison made with prior CT and MRI from 08/09/2022. FINDINGS: Brain: Age-related cerebral atrophy with chronic small vessel ischemic disease. Few scatter remote lacunar infarcts present about the right basal ganglia. There has been continued interval evolution of the subacute right PCA distribution infarct, similar in size and distribution. Additional smaller areas of acute ischemia involving the thalami and right ACA territory are grossly stable as well. No significant regional mass effect. No evidence for hemorrhagic transformation by CT. No other new large vessel territory infarct. No acute intracranial hemorrhage. No mass lesion or midline shift. No hydrocephalus or extra-axial fluid collection. Vascular: No abnormal hyperdense vessel. Scattered vascular calcifications noted within the carotid siphons. Skull: Scalp soft tissues and calvarium demonstrate no acute finding. Sinuses/Orbits: Globes and orbital soft tissues within normal limits. Nasogastric tube in place. Scattered mucosal thickening present throughout the paranasal sinuses. No significant mastoid effusion. Other: None. IMPRESSION: 1. Continued interval evolution of subacute right PCA distribution infarct, similar in size and distribution. Additional smaller areas of acute ischemia involving the thalami and right ACA territory  are grossly stable as well. No significant regional mass effect. No evidence for hemorrhagic  transformation by CT. 2. No other new acute intracranial abnormality. 3. Underlying atrophy with chronic small vessel ischemic disease. Electronically Signed   By: Jeannine Boga M.D.   On: 08/16/2022 00:59   DG Chest Port 1 View  Result Date: 08/15/2022 CLINICAL DATA:  10027 Tachypnea 10027 EXAM: PORTABLE CHEST 1 VIEW COMPARISON:  Chest x-ray 07/30/2022 FINDINGS: Enteric tube coursing below the diaphragm with tip and side port overlying the expected region of the gastric lumen. The heart and mediastinal contours are within normal limits. Aortic calcification. Low lung volumes. Left base airspace opacity likely atelectasis. No pulmonary edema. No pleural effusion. No pneumothorax. No acute osseous abnormality. IMPRESSION: 1. Low lung volumes with left base airspace opacities - likely atelectasis. Superimposed infection undulation cannot be fully excluded. 2.  Aortic Atherosclerosis (ICD10-I70.0). Electronically Signed   By: Iven Finn M.D.   On: 08/15/2022 22:26   DG Abd Portable 1V  Result Date: 08/13/2022 CLINICAL DATA:  Encounter for nasogastric tube placement EXAM: PORTABLE ABDOMEN - 1 VIEW COMPARISON:  None Available. FINDINGS: NG tube with tip in the stomach. Side port below the GE junction. Nonobstructive bowel gas pattern. IMPRESSION: NG tube in stomach. Electronically Signed   By: Suzy Bouchard M.D.   On: 08/13/2022 13:19   DG Abd 1 View  Result Date: 08/10/2022 CLINICAL DATA:  Nasogastric tube placement EXAM: ABDOMEN - 1 VIEW COMPARISON:  CT abdomen 08/10/2022 FINDINGS: The nasogastric tube is observed with distal tip in the gastric body and side port in the gastric cardia. Stable indistinct airspace opacity in the retrocardiac region. Irregular calcified soft tissue mass in the upper abdomen as reported on recent CT. Atherosclerotic calcification of the aortic arch. IMPRESSION: 1. Nasogastric tube tip is in the gastric body and side port is in the gastric cardia. 2. Stable  indistinct airspace opacity in the retrocardiac region. 3. Irregular calcified mesenteric mass in upper abdomen as reported on prior CT. Electronically Signed   By: Van Clines M.D.   On: 08/10/2022 15:51   CT ABDOMEN PELVIS WO CONTRAST  Addendum Date: 08/10/2022   ADDENDUM REPORT: 08/10/2022 11:15 ADDENDUM: Critical Value/emergent results were called by telephone at the time of interpretation on 08/10/2022 at 11:15 am to provider [facility] , who verbally acknowledged these results. Electronically Signed   By: Kerby Moors M.D.   On: 08/10/2022 11:15   Result Date: 08/10/2022 CLINICAL DATA:  Evaluate for retroperitoneal hematoma. Patient on heparin with persistent anemia. EXAM: CT ABDOMEN AND PELVIS WITHOUT CONTRAST TECHNIQUE: Multidetector CT imaging of the abdomen and pelvis was performed following the standard protocol without IV contrast. RADIATION DOSE REDUCTION: This exam was performed according to the departmental dose-optimization program which includes automated exposure control, adjustment of the mA and/or kV according to patient size and/or use of iterative reconstruction technique. COMPARISON:  07/23/2022 FINDINGS: Lower chest: Small bilateral pleural effusions with overlying atelectasis identified. Cardiac enlargement and coronary artery calcifications. Hepatobiliary: No focal liver abnormality is seen. No gallstones, gallbladder wall thickening, or biliary dilatation. Pancreas: Unremarkable. No pancreatic ductal dilatation or surrounding inflammatory changes. Spleen: Normal in size without focal abnormality. Adrenals/Urinary Tract: Left adrenal nodule measures 1.7 cm and is stable from 05/02/2021 most likely representing a benign adenoma. No follow-up imaging recommended. No nephrolithiasis or hydronephrosis. Bilateral kidney cysts are unchanged. No follow-up imaging recommended. The left retroperitoneal hematoma has mass effect upon the mid left ureter resulting in its medial  deviation. No signs of significant hydronephrosis. Urinary bladder is partially decompressed around a Foley catheter balloon with gas in the lumen. Stomach/Bowel: Stomach appears normal. There is no bowel wall thickening, inflammation or distension. Distal colonic diverticulosis without signs of acute diverticulitis. Vascular/Lymphatic: Aortic atherosclerosis. No aneurysm. Partially calcified soft tissue mass within the central mesentery is unchanged, image 31/2. No abdominopelvic adenopathy. Reproductive: Prostate is unremarkable. Other: Left retroperitoneal hematoma is identified and appears new compared with the previous exam. This measures 10.8 by 17.1 x 9.7 cm (volume = 940 cm^3), image 39/5 and image 47/2 there is acute on subacute components within this mass. Within the left pelvic sidewall there is a small intramuscular hematoma which measures 4.0 x 2.3 by 9.2 cm (volume = 44 cm^3), image 77/2 and image 62/5. Small amount of hemorrhage is identified within the presacral soft tissue space, image 77/2. There is also a small amount of right retroperitoneal hemorrhage, image 55/2. No signs of pneumoperitoneum. Anasarca identified within the body wall bilaterally. Musculoskeletal: No acute osseous abnormality. Lumbar degenerative disc disease. IMPRESSION: 1. Left retroperitoneal hematoma is identified and appears new compared with the previous exam. This measures 10.8 x 17.1 x 9.7 cm (volume = 940 cc). There is acute on subacute blood products within this mass. 2. There is a small intramuscular hematoma within the left pelvic sidewall which measures 4.0 x 2.3 by 9.2 cm (volume = 44 cc). 3. Small amount of hemorrhage is identified within the presacral soft tissue space. There is also a small amount of right retroperitoneal hemorrhage. 4. Small bilateral pleural effusions with overlying atelectasis. 5. Cardiac enlargement and coronary artery calcifications. 6. Stable appearance of partially calcified soft tissue  mass within the central mesentery. 7.  Aortic Atherosclerosis (ICD10-I70.0). Electronically Signed: By: Kerby Moors M.D. On: 08/10/2022 11:03   MR BRAIN W WO CONTRAST  Result Date: 08/09/2022 CLINICAL DATA:  Acute neurologic deficit EXAM: MRI HEAD WITHOUT AND WITH CONTRAST MRA HEAD WITHOUT CONTRAST MRA NECK WITHOUT AND WITH CONTRAST TECHNIQUE: Multiplanar, multiecho pulse sequences of the brain and surrounding structures were obtained without and with intravenous contrast. Angiographic images of the Circle of Willis were obtained using MRA technique without intravenous contrast. Angiographic images of the neck were obtained using MRA technique without and with intravenous contrast. Carotid stenosis measurements (when applicable) are obtained utilizing NASCET criteria, using the distal internal carotid diameter as the denominator. CONTRAST:  63m GADAVIST GADOBUTROL 1 MMOL/ML IV SOLN COMPARISON:  Head CT 08/09/2022 FINDINGS: MRI HEAD FINDINGS Brain: There is multifocal acute ischemia, with the largest area being the right PCA territory. There is also a right ACA territory infarct affecting the inferior right frontal lobe. There are smaller foci of acute ischemia within the thalami. No chronic microhemorrhage or siderosis. There is multifocal hyperintense T2-weighted signal within the white matter. Generalized volume loss. The midline structures are normal. There is no abnormal contrast enhancement. Vascular: Normal flow voids. Skull and upper cervical spine: Normal marrow signal. Sinuses/Orbits: Negative. Other: None. MRA HEAD FINDINGS POSTERIOR CIRCULATION: --Vertebral arteries: Normal --Inferior cerebellar arteries: Normal. --Basilar artery: Normal. --Superior cerebellar arteries: Normal. --Posterior cerebral arteries: Proximal occlusion of the right PCA P1 segment. Normal left PCA. ANTERIOR CIRCULATION: --Intracranial internal carotid arteries: Normal. --Anterior cerebral arteries (ACA): Severe stenosis of  the mid A2 segment of the right ACA. Left ACA normal. --Middle cerebral arteries (MCA): Severe stenosis of the left MCA proximal M2 inferior division. Normal right MCA. MRA NECK FINDINGS Normal carotid and vertebral systems.  Visible aorta is  normal. IMPRESSION: 1. Multifocal acute ischemia, with the largest area being the right PCA territory. No hemorrhage or mass effect. 2. Proximal occlusion of the right PCA P1 segment. 3. 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 4. Severe stenosis of the left MCA proximal M2 inferior division. Electronically Signed   By: Ulyses Jarred M.D.   On: 08/09/2022 22:59   MR ANGIO HEAD WO CONTRAST  Result Date: 08/09/2022 CLINICAL DATA:  Acute neurologic deficit EXAM: MRI HEAD WITHOUT AND WITH CONTRAST MRA HEAD WITHOUT CONTRAST MRA NECK WITHOUT AND WITH CONTRAST TECHNIQUE: Multiplanar, multiecho pulse sequences of the brain and surrounding structures were obtained without and with intravenous contrast. Angiographic images of the Circle of Willis were obtained using MRA technique without intravenous contrast. Angiographic images of the neck were obtained using MRA technique without and with intravenous contrast. Carotid stenosis measurements (when applicable) are obtained utilizing NASCET criteria, using the distal internal carotid diameter as the denominator. CONTRAST:  63m GADAVIST GADOBUTROL 1 MMOL/ML IV SOLN COMPARISON:  Head CT 08/09/2022 FINDINGS: MRI HEAD FINDINGS Brain: There is multifocal acute ischemia, with the largest area being the right PCA territory. There is also a right ACA territory infarct affecting the inferior right frontal lobe. There are smaller foci of acute ischemia within the thalami. No chronic microhemorrhage or siderosis. There is multifocal hyperintense T2-weighted signal within the white matter. Generalized volume loss. The midline structures are normal. There is no abnormal contrast enhancement. Vascular: Normal flow voids. Skull and  upper cervical spine: Normal marrow signal. Sinuses/Orbits: Negative. Other: None. MRA HEAD FINDINGS POSTERIOR CIRCULATION: --Vertebral arteries: Normal --Inferior cerebellar arteries: Normal. --Basilar artery: Normal. --Superior cerebellar arteries: Normal. --Posterior cerebral arteries: Proximal occlusion of the right PCA P1 segment. Normal left PCA. ANTERIOR CIRCULATION: --Intracranial internal carotid arteries: Normal. --Anterior cerebral arteries (ACA): Severe stenosis of the mid A2 segment of the right ACA. Left ACA normal. --Middle cerebral arteries (MCA): Severe stenosis of the left MCA proximal M2 inferior division. Normal right MCA. MRA NECK FINDINGS Normal carotid and vertebral systems.  Visible aorta is normal. IMPRESSION: 1. Multifocal acute ischemia, with the largest area being the right PCA territory. No hemorrhage or mass effect. 2. Proximal occlusion of the right PCA P1 segment. 3. 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 4. Severe stenosis of the left MCA proximal M2 inferior division. Electronically Signed   By: KUlyses JarredM.D.   On: 08/09/2022 22:59   MR ANGIO NECK W WO CONTRAST  Result Date: 08/09/2022 CLINICAL DATA:  Acute neurologic deficit EXAM: MRI HEAD WITHOUT AND WITH CONTRAST MRA HEAD WITHOUT CONTRAST MRA NECK WITHOUT AND WITH CONTRAST TECHNIQUE: Multiplanar, multiecho pulse sequences of the brain and surrounding structures were obtained without and with intravenous contrast. Angiographic images of the Circle of Willis were obtained using MRA technique without intravenous contrast. Angiographic images of the neck were obtained using MRA technique without and with intravenous contrast. Carotid stenosis measurements (when applicable) are obtained utilizing NASCET criteria, using the distal internal carotid diameter as the denominator. CONTRAST:  813mGADAVIST GADOBUTROL 1 MMOL/ML IV SOLN COMPARISON:  Head CT 08/09/2022 FINDINGS: MRI HEAD FINDINGS Brain: There is  multifocal acute ischemia, with the largest area being the right PCA territory. There is also a right ACA territory infarct affecting the inferior right frontal lobe. There are smaller foci of acute ischemia within the thalami. No chronic microhemorrhage or siderosis. There is multifocal hyperintense T2-weighted signal within the white matter. Generalized volume  loss. The midline structures are normal. There is no abnormal contrast enhancement. Vascular: Normal flow voids. Skull and upper cervical spine: Normal marrow signal. Sinuses/Orbits: Negative. Other: None. MRA HEAD FINDINGS POSTERIOR CIRCULATION: --Vertebral arteries: Normal --Inferior cerebellar arteries: Normal. --Basilar artery: Normal. --Superior cerebellar arteries: Normal. --Posterior cerebral arteries: Proximal occlusion of the right PCA P1 segment. Normal left PCA. ANTERIOR CIRCULATION: --Intracranial internal carotid arteries: Normal. --Anterior cerebral arteries (ACA): Severe stenosis of the mid A2 segment of the right ACA. Left ACA normal. --Middle cerebral arteries (MCA): Severe stenosis of the left MCA proximal M2 inferior division. Normal right MCA. MRA NECK FINDINGS Normal carotid and vertebral systems.  Visible aorta is normal. IMPRESSION: 1. Multifocal acute ischemia, with the largest area being the right PCA territory. No hemorrhage or mass effect. 2. Proximal occlusion of the right PCA P1 segment. 3. 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 4. Severe stenosis of the left MCA proximal M2 inferior division. Electronically Signed   By: Ulyses Jarred M.D.   On: 08/09/2022 22:59   US Venous Img Lower Bilateral (DVT)  Result Date: 08/09/2022 CLINICAL DATA:  Stroke EXAM: BILATERAL LOWER EXTREMITY VENOUS DOPPLER ULTRASOUND TECHNIQUE: Gray-scale sonography with graded compression, as well as color Doppler and duplex ultrasound were performed to evaluate the lower extremity deep venous systems from the level of the common femoral  vein and including the common femoral, femoral, profunda femoral, popliteal and calf veins including the posterior tibial, peroneal and gastrocnemius veins when visible. The superficial great saphenous vein was also interrogated. Spectral Doppler was utilized to evaluate flow at rest and with distal augmentation maneuvers in the common femoral, femoral and popliteal veins. COMPARISON:  None Available. FINDINGS: RIGHT LOWER EXTREMITY Common Femoral Vein: No evidence of thrombus. Normal compressibility, respiratory phasicity and response to augmentation. Saphenofemoral Junction: No evidence of thrombus. Normal compressibility and flow on color Doppler imaging. Profunda Femoral Vein: No evidence of thrombus. Normal compressibility and flow on color Doppler imaging. Femoral Vein: No evidence of thrombus. Normal compressibility, respiratory phasicity and response to augmentation. Popliteal Vein: No evidence of thrombus. Normal compressibility, respiratory phasicity and response to augmentation. Calf Veins: No evidence of thrombus. Normal compressibility and flow on color Doppler imaging. LEFT LOWER EXTREMITY Common Femoral Vein: No evidence of thrombus. Normal compressibility, respiratory phasicity and response to augmentation. Saphenofemoral Junction: No evidence of thrombus. Normal compressibility and flow on color Doppler imaging. Profunda Femoral Vein: No evidence of thrombus. Normal compressibility and flow on color Doppler imaging. Femoral Vein: No evidence of thrombus. Normal compressibility, respiratory phasicity and response to augmentation. Popliteal Vein: No evidence of thrombus. Normal compressibility, respiratory phasicity and response to augmentation. Calf Veins: No evidence of thrombus. Normal compressibility and flow on color Doppler imaging. IMPRESSION: No evidence of deep venous thrombosis in either lower extremity. Electronically Signed   By: Albin Felling M.D.   On: 08/09/2022 14:14   CT HEAD WO  CONTRAST (5MM)  Result Date: 08/09/2022 CLINICAL DATA:  Altered mental status, facial droop. EXAM: CT HEAD WITHOUT CONTRAST TECHNIQUE: Contiguous axial images were obtained from the base of the skull through the vertex without intravenous contrast. RADIATION DOSE REDUCTION: This exam was performed according to the departmental dose-optimization program which includes automated exposure control, adjustment of the mA and/or kV according to patient size and/or use of iterative reconstruction technique. COMPARISON:  Head CT dated 07/26/2022. FINDINGS: Brain: New area of low-density within the RIGHT lower occipital-temporal lobe. Associated sulcal effacement. No associated midline shift or  herniation. Additional new area of low-density within the lower RIGHT frontal lobe. Chronic small vessel ischemic changes within the bilateral periventricular white matter regions. No parenchymal or extra-axial hemorrhage. Vascular: Chronic calcified atherosclerotic changes of the large vessels at the skull base. No unexpected hyperdense vessel. Skull: Normal. Negative for fracture or focal lesion. Sinuses/Orbits: No acute finding. Other: None. IMPRESSION: 1. New areas of low-density within the lower RIGHT occipital-temporal lobe and lower RIGHT frontal lobe, consistent with edema, and consistent with acute-to-subacute infarcts. Given the multifocality, findings are suspicious for emboli. Recommend brain MRI for further characterization. 2. No intracranial hemorrhage. 3. Chronic small vessel ischemic changes within the bilateral periventricular white matter regions. Critical Value/emergent results were called by telephone at the time of interpretation on 08/09/2022 at 10:12 am to provider [facility] , who verbally acknowledged these results. Electronically Signed   By: Franki Cabot M.D.   On: 08/09/2022 10:12   ECHOCARDIOGRAM COMPLETE  Result Date: 08/01/2022    ECHOCARDIOGRAM REPORT   Patient Name:   MERION PETTUS Date of  Exam: 08/01/2022 Medical Rec #:  [id number]        Height:       67.0 in Accession #:    [id number]       Weight:       175.7 lb Date of Birth:  05/26/1946        BSA:          1.914 m Patient Age:    20 years         BP:           150/63 mmHg Patient Gender: M                HR:           76 bpm. Exam Location:  [facility] Procedure: 2D Echo Indications:     elevated troponin  History:         Patient has no prior history of Echocardiogram examinations.                  Arrythmias:Atrial Fibrillation; Risk Factors:Hypertension,                  Diabetes and Dyslipidemia.  Sonographer:     Harvie Junior Referring Phys:  [id number] [place] Diagnosing Phys: Ida Rogue MD IMPRESSIONS  1. Left ventricular ejection fraction, by estimation, is 55 to 60%. The left ventricle has normal function. The left ventricle has no regional wall motion abnormalities. There is mild left ventricular hypertrophy. Left ventricular diastolic parameters are indeterminate.  2. Right ventricular systolic function is normal. The right ventricular size is normal. There is normal pulmonary artery systolic pressure.  3. The mitral valve is normal in structure. Mild to moderate mitral valve regurgitation. No evidence of mitral stenosis.  4. The aortic valve is normal in structure. Aortic valve regurgitation is mild. No aortic stenosis is present.  5. The inferior vena cava is normal in size with greater than 50% respiratory variability, suggesting right atrial pressure of 3 mmHg. FINDINGS  Left Ventricle: Left ventricular ejection fraction, by estimation, is 55 to 60%. The left ventricle has normal function. The left ventricle has no regional wall motion abnormalities. The left ventricular internal cavity size was normal in size. There is  mild left ventricular hypertrophy. Left ventricular diastolic parameters are indeterminate. Right Ventricle: The right ventricular size is normal. No increase in right ventricular wall thickness. Right ventricular  systolic function is normal. There  is normal pulmonary artery systolic pressure. The tricuspid regurgitant velocity is 2.35 m/s, and  with an assumed right atrial pressure of 3 mmHg, the estimated right ventricular systolic pressure is [id number] mmHg. Left Atrium: Left atrial size was normal in size. Right Atrium: Right atrial size was normal in size. Pericardium: There is no evidence of pericardial effusion. Mitral Valve: The mitral valve is normal in structure. Mild to moderate mitral valve regurgitation. No evidence of mitral valve stenosis. Tricuspid Valve: The tricuspid valve is normal in structure. Tricuspid valve regurgitation is mild . No evidence of tricuspid stenosis. Aortic Valve: The aortic valve is normal in structure. Aortic valve regurgitation is mild. Aortic regurgitation PHT measures 1346 msec. No aortic stenosis is present. Aortic valve mean gradient measures 3.5 mmHg. Aortic valve peak gradient measures 6.1 mmHg. Aortic valve area, by VTI measures 2.53 cm. Pulmonic Valve: The pulmonic valve was normal in structure. Pulmonic valve regurgitation is not visualized. No evidence of pulmonic stenosis. Aorta: The aortic root is normal in size and structure. Venous: The inferior vena cava is normal in size with greater than 50% respiratory variability, suggesting right atrial pressure of 3 mmHg. IAS/Shunts: No atrial level shunt detected by color flow Doppler.  LEFT VENTRICLE PLAX 2D LVIDd:         4.10 cm      Diastology LVIDs:         3.00 cm      LV e' medial:    5.91 cm/s LV PW:         1.30 cm      LV E/e' medial:  17.2 LV IVS:        1.20 cm      LV e' lateral:   11.25 cm/s LVOT diam:     2.00 cm      LV E/e' lateral: 9.1 LV SV:         55 LV SV Index:   29 LVOT Area:     3.14 cm  LV Volumes (MOD) LV vol d, MOD A2C: 63.2 ml LV vol d, MOD A4C: 119.0 ml LV vol s, MOD A2C: 30.2 ml LV vol s, MOD A4C: 59.1 ml LV SV MOD A2C:     33.0 ml LV SV MOD A4C:     119.0 ml LV SV MOD BP:      43.9 ml RIGHT VENTRICLE  RV Basal diam:  3.60 cm RV Mid diam:    2.70 cm RV S prime:     17.17 cm/s TAPSE (M-mode): 2.1 cm LEFT ATRIUM             Index        RIGHT ATRIUM           Index LA diam:        4.00 cm 2.09 cm/m   RA Area:     15.50 cm LA Vol (A2C):   61.2 ml 31.98 ml/m  RA Volume:   34.10 ml  17.82 ml/m LA Vol (A4C):   65.5 ml 34.22 ml/m LA Biplane Vol: 66.5 ml 34.74 ml/m  AORTIC VALVE                    PULMONIC VALVE AV Area (Vmax):    2.38 cm     PV Vmax:          1.17 m/s AV Area (Vmean):   2.33 cm     PV Peak grad:     5.5 mmHg AV Area (VTI):  2.53 cm     PR End Diast Vel: 2.61 msec AV Vmax:           123.00 cm/s AV Vmean:          83.000 cm/s AV VTI:            0.218 m AV Peak Grad:      6.1 mmHg AV Mean Grad:      3.5 mmHg LVOT Vmax:         93.30 cm/s LVOT Vmean:        61.500 cm/s LVOT VTI:          0.175 m LVOT/AV VTI ratio: 0.80 AI PHT:            1346 msec  AORTA Ao Root diam: 3.30 cm MITRAL VALVE                TRICUSPID VALVE MV Area (PHT): 3.85 cm     TR Peak grad:   22.1 mmHg MV Decel Time: 197 msec     TR Vmax:        235.00 cm/s MR Peak grad: 105.6 mmHg MR Mean grad: 74.0 mmHg     SHUNTS MR Vmax:      513.80 cm/s   Systemic VTI:  0.18 m MR Vmean:     408.0 cm/s    Systemic Diam: 2.00 cm MV E velocity: 102.00 cm/s MV A velocity: 37.70 cm/s MV E/A ratio:  2.71 Ida Rogue MD Electronically signed by Ida Rogue MD Signature Date/Time: 08/01/2022/4:15:13 PM    Final    DG Shoulder Left  Result Date: 08/01/2022 CLINICAL DATA:  Bilateral shoulder pain. EXAM: LEFT SHOULDER - 2+ VIEW COMPARISON:  None Available. FINDINGS: No fracture or dislocation. Mild osteoarthritis of the glenohumeral joint. Mild degenerative changes of the acromioclavicular joint. Soft tissues are normal. IMPRESSION: 1.  No acute osseous injury of the left shoulder. Electronically Signed   By: Kathreen Devoid M.D.   On: 08/01/2022 12:36   DG Shoulder Right  Result Date: 08/01/2022 CLINICAL DATA:  Is patient found on ground,  bilateral shoulder pain EXAM: RIGHT SHOULDER - 2+ VIEW COMPARISON:  Chest radiograph 07/17/2022 FINDINGS: Moderate spurring and degenerative chondral thinning in the glenohumeral joint. Mild spurring of the [facility] joint. Subacromial morphology is type 2 (curved). No fracture, malalignment, or acute bony findings. IMPRESSION: 1. Moderate osteoarthritis of the glenohumeral joint. Mild spurring of the [facility] joint. Electronically Signed   By: Van Clines M.D.   On: 08/01/2022 12:36   CT ABDOMEN PELVIS W CONTRAST  Result Date: 07/19/2022 CLINICAL DATA:  Abdominal pain. 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 EXAM: CT ABDOMEN AND PELVIS WITH CONTRAST TECHNIQUE: Multidetector CT imaging of the abdomen and pelvis was performed using the standard protocol following bolus administration of intravenous contrast. RADIATION DOSE REDUCTION: This exam was performed according to the departmental dose-optimization program which includes automated exposure control, adjustment of the mA and/or kV according to patient size and/or use of iterative reconstruction technique. CONTRAST:  124m OMNIPAQUE IOHEXOL 300 MG/ML  SOLN COMPARISON:  PET-CT 05/21/2021.  CT abdomen and pelvis 01/28/2022. FINDINGS: Lower chest: No acute abnormality. Hepatobiliary: No focal liver abnormality is seen. No gallstones, gallbladder wall thickening, or biliary dilatation. Pancreas: Unremarkable. No pancreatic ductal dilatation or surrounding inflammatory changes. Spleen: Normal in size without focal abnormality. Adrenals/Urinary Tract: Left adrenal nodularity measuring up to 15 mm is unchanged. There is no hydronephrosis or perinephric fat stranding. There are bilateral renal  cortical cysts measuring up to 16 mm, unchanged. The right adrenal gland and bladder are within normal limits. Stomach/Bowel: Stomach is within normal limits. Appendix appears normal. No evidence of bowel wall thickening, distention, or inflammatory changes.  There is diffuse colonic diverticulosis. Partially calcified mesenteric mass adjacent to the transverse colon is stable measuring 3.6 x 5.3 cm. Stable right central mesenteric partially calcified mass measuring 2.8 x 1.5 cm. No new masses are seen. Vascular/Lymphatic: Aortic atherosclerosis. No enlarged abdominal or pelvic lymph nodes. Reproductive: Prostate is enlarged. Other: There is no ascites or free air. There is a small fat containing umbilical hernia. Increased density in the right inguinal canal may represent high riding testicle. Musculoskeletal: No acute or significant osseous findings. IMPRESSION: 1. No acute localizing process in the abdomen or pelvis. 2. Stable partially calcified mesenteric masses. 3. Stable left adrenal nodule. 4. Colonic diverticulosis. 5. Prostate enlargement. Aortic Atherosclerosis (ICD10-I70.0). Electronically Signed   By: Ronney Asters M.D.   On: 07/10/2022 20:08   DG Chest Portable 1 View  Result Date: 07/16/2022 CLINICAL DATA:  Patient found on the floor. EXAM: PORTABLE CHEST 1 VIEW COMPARISON:  None Available. FINDINGS: The cardiac silhouette is borderline in size. There is moderate severity calcification of the aortic arch. Mild prominence of the bilateral infrahilar pulmonary vasculature is seen. There is no evidence of an acute infiltrate, pleural effusion or pneumothorax. The visualized skeletal structures are unremarkable. IMPRESSION: Findings consistent with mild pulmonary vascular congestion. Electronically Signed   By: Virgina Norfolk M.D.   On: 08/04/2022 20:03   CT HEAD WO CONTRAST (5MM)  Result Date: 08/01/2022 CLINICAL DATA:  Altered mental status. EXAM: CT HEAD WITHOUT CONTRAST TECHNIQUE: Contiguous axial images were obtained from the base of the skull through the vertex without intravenous contrast. RADIATION DOSE REDUCTION: This exam was performed according to the departmental dose-optimization program which includes automated exposure control,  adjustment of the mA and/or kV according to patient size and/or use of iterative reconstruction technique. COMPARISON:  None Available. FINDINGS: Brain: There is mild cerebral atrophy with widening of the extra-axial spaces and ventricular dilatation. There are areas of decreased attenuation within the white matter tracts of the supratentorial brain, consistent with microvascular disease changes. 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 Vascular: There is marked severity calcification of the bilateral cavernous carotid arteries. Skull: Normal. Negative for fracture or focal lesion. Sinuses/Orbits: Mild left maxillary sinus and mild bilateral ethmoid sinus mucosal thickening is seen. Chronic and/or postoperative changes are seen involving the medial wall of the right maxillary sinus. Other: None. IMPRESSION: 1. No acute intracranial abnormality. 2. Generalized cerebral atrophy with chronic white matter small vessel ischemic changes. 3. 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 4. Mild left maxillary sinus and bilateral ethmoid sinus disease. Electronically Signed   By: Virgina Norfolk M.D.   On: 08/01/2022 19:51    Microbiology: Recent Results (from the past 240 hour(s))  MRSA Next Gen by PCR, Nasal     Status: None   Collection Time: 08/09/22 12:51 PM   Specimen: Nasal Mucosa; Nasal Swab  Result Value Ref Range Status   MRSA by PCR Next Gen NOT DETECTED NOT DETECTED Final    Comment: (NOTE) The GeneXpert MRSA Assay (FDA approved for NASAL specimens only), is one component of a comprehensive MRSA colonization surveillance program. It is not intended to diagnose MRSA infection nor to guide or monitor treatment for MRSA infections. Test performance is not FDA approved in patients less than 2 years  old. Performed at [facility], [address] [place], [place] [address]   Culture, blood (Routine X 2) w Reflex to ID Panel     Status: None (Preliminary  result)   Collection Time: 08/15/22 10:40 PM   Specimen: BLOOD  Result Value Ref Range Status   Specimen Description BLOOD LEFT AC  Final   Special Requests   Final    BOTTLES DRAWN AEROBIC AND ANAEROBIC Blood Culture results may not be optimal due to an excessive volume of blood received in culture bottles   Culture   Final    NO GROWTH 4 DAYS Performed at [facility], [address], [place], [place] [address]    Report Status PENDING  Incomplete  Culture, blood (Routine X 2) w Reflex to ID Panel     Status: None (Preliminary result)   Collection Time: 08/15/22 10:40 PM   Specimen: BLOOD  Result Value Ref Range Status   Specimen Description BLOOD LEFT FA  Final   Special Requests   Final    BOTTLES DRAWN AEROBIC AND ANAEROBIC Blood Culture adequate volume   Culture   Final    NO GROWTH 4 DAYS Performed at [facility], [address], [place], [place] [address]    Report Status PENDING  Incomplete    Time spent: 30 minutes  Signed: Sidney Ace, MD 09/03/22  Time of death 10/01/2254 on 2022-09-02

## 2022-09-06 NOTE — Progress Notes (Signed)
Pt's daughter Mickel Baas and pt's sister Lelon Frohlich arrived and at pt's bedside at 2230, update given on pt's current condition and deteriorating changes. No additional questions posed to writer at this time. Pt family tearful & supportive.   Pt noted to have longer pauses of agonal RR and desaturation in HR and O2 at 2242. Family educated on natural process of death- no additional questions posed to this Therapist, sports. Emotional support provided, pt appears comfortable.

## 2022-09-06 DEATH — deceased

## 2022-11-10 IMAGING — CT CT RENAL STONE PROTOCOL
2 of 4 series · 13 of 46 positions shown, 15 images · non-contrast
Comparison: CT stone 10/02/1998
COMPARISON: Uploaded outside hospital CT study 05/15/2021 but in
reality scanned on 01/08/2017.
COMPARISON: CT stone 10/02/1998

Addendum:
CLINICAL DATA: Flank pain, kidney stone suspected. L groin. Pt has
hx of kidney stones, feels similar.

EXAM:
CT ABDOMEN AND PELVIS WITHOUT CONTRAST
TECHNIQUE: Multidetector CT imaging of the abdomen and pelvis was performed
following the standard protocol without IV contrast.

[Series 2: stone full standard · axial · 0.76mm/px · z∈[-589,-189]mm · 10 of 96 slices shown, 12 images]
[im 8/96  soft-tissue]
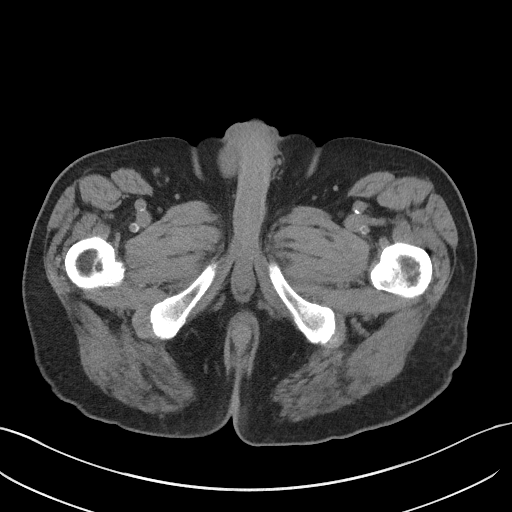
[im 8/96  bone]
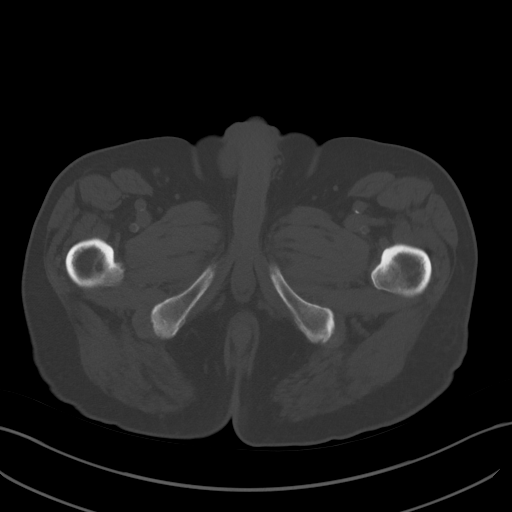
[im 16/96  soft-tissue]
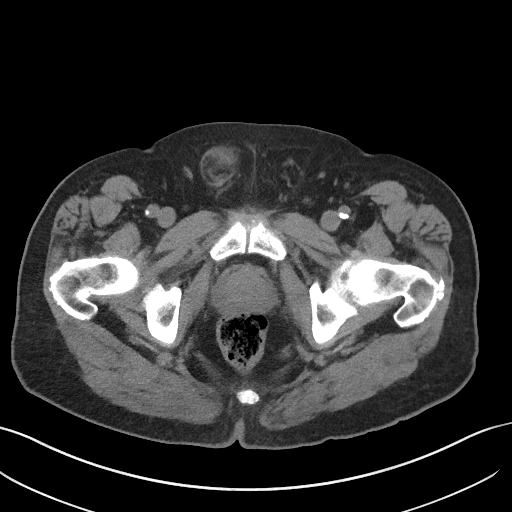
[im 24/96  soft-tissue]
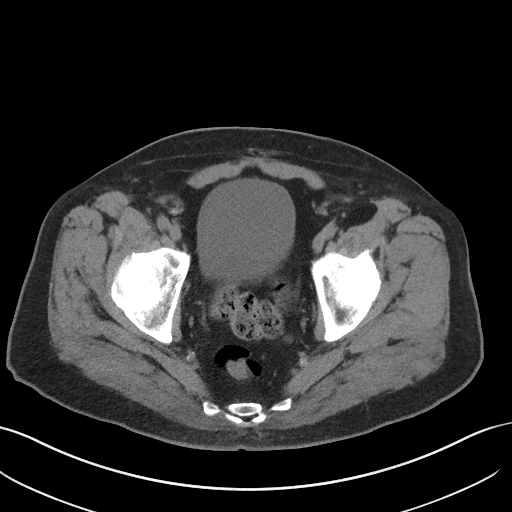
[im 36/96  soft-tissue]
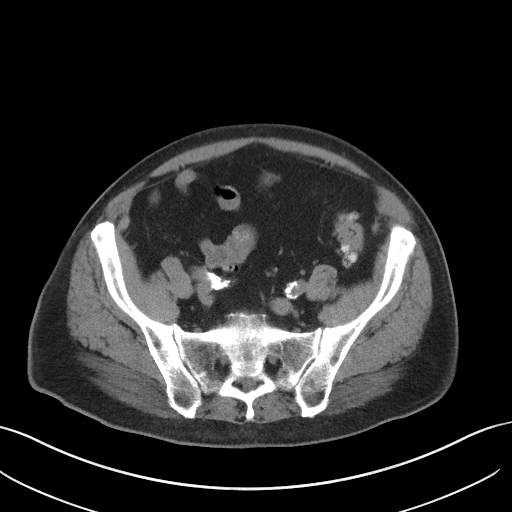
[im 44/96  soft-tissue]
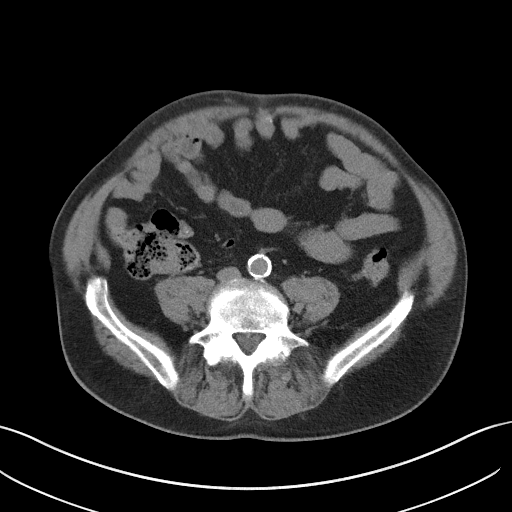
[im 52/96  soft-tissue]
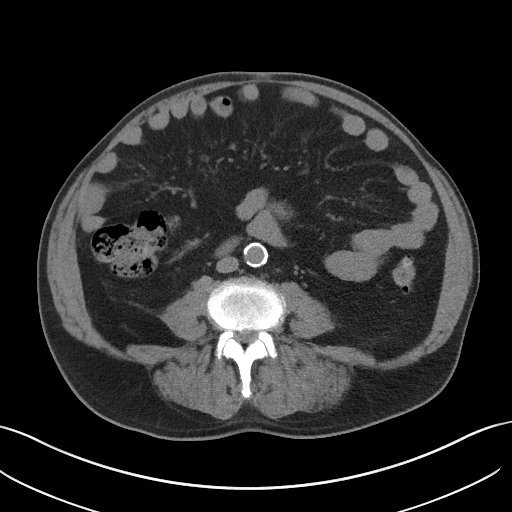
[im 60/96  soft-tissue]
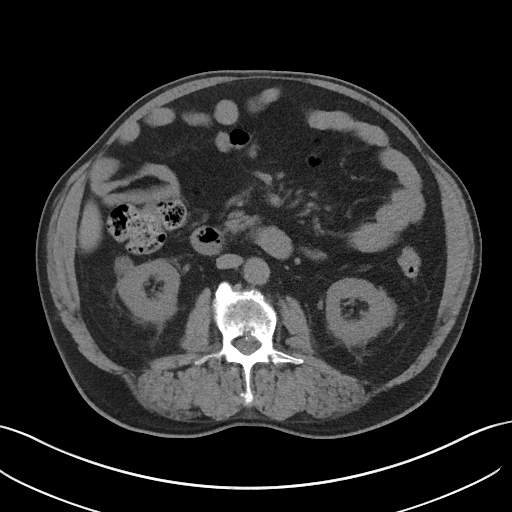
[im 72/96  soft-tissue]
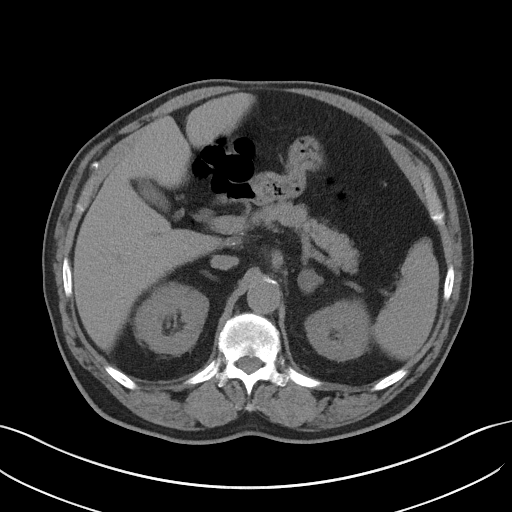
[im 80/96  soft-tissue]
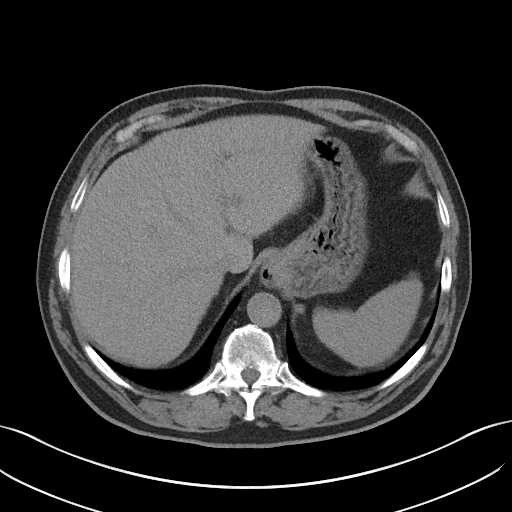
[im 80/96  bone]
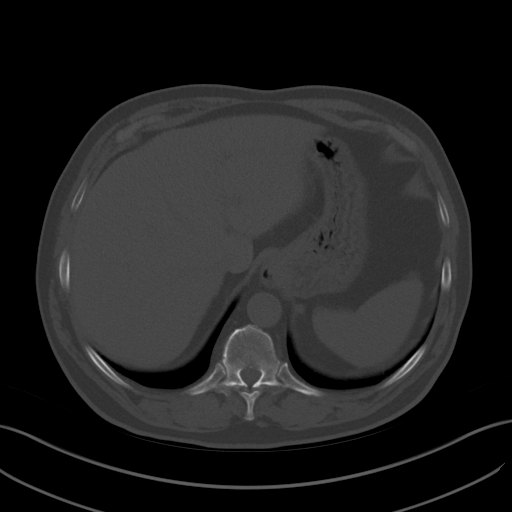
[im 88/96  soft-tissue]
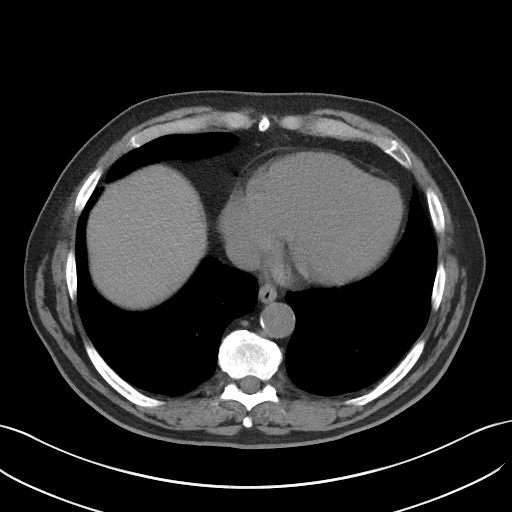

[Series 5: coronal · coronal · 0.73mm/px · 3 of 144 slices shown]
[im 48/144  soft-tissue]
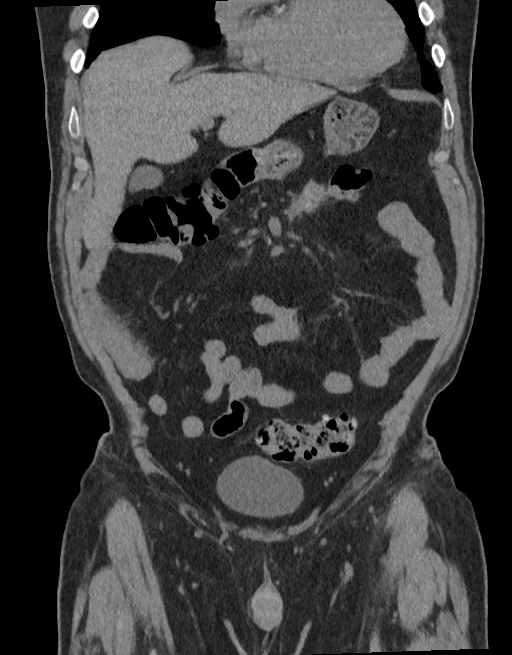
[im 64/144  soft-tissue]
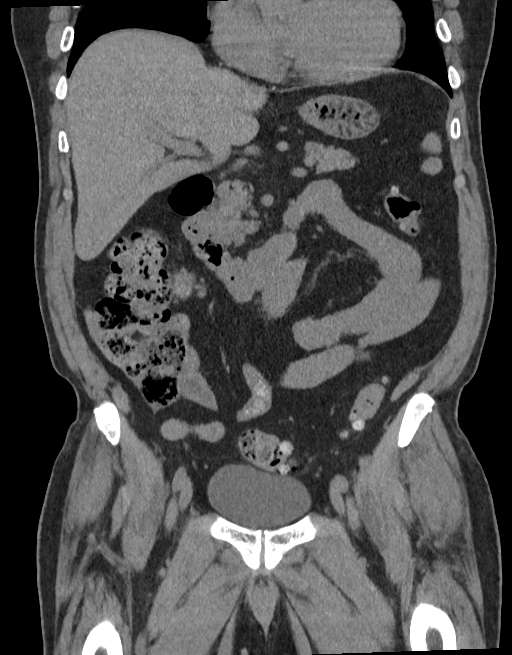
[im 80/144  soft-tissue]
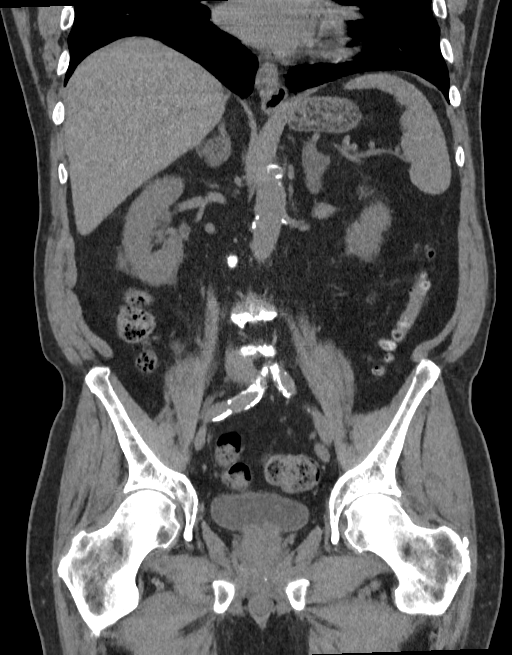

[13 of 46 positions shown; findings below may reference images not displayed]

FINDINGS: Lower chest: Coronary artery calcifications.  Tiny hiatal hernia.

Hepatobiliary: Punctate calcifications likely sequela prior
granulomatous disease. No focal liver abnormality. No gallstones,
gallbladder wall thickening, or pericholecystic fluid. No biliary
dilatation.

Pancreas: No focal lesion. Normal pancreatic contour. No surrounding
inflammatory changes. No main pancreatic ductal dilatation.

Spleen: Normal in size without focal abnormality.

Adrenals/Urinary Tract:

No right adrenal gland nodule. There is a slightly increased in size
1.5 cm left adrenal gland nodule with a density of 10 Hounsfield
units likely representing an adenoma.

Nonspecific bilateral perinephric stranding. No nephrolithiasis and
no hydronephrosis. No definite contour-deforming renal mass.

No ureterolithiasis or hydroureter.

The urinary bladder is unremarkable.

Stomach/Bowel: Stomach is within normal limits. No evidence of bowel
wall thickening or dilatation. Scattered colonic diverticulosis.
Appendix appears normal.

Vascular/Lymphatic: No abdominal aorta or iliac aneurysm. Severe
atherosclerotic plaque of the aorta and its branches. No abdominal,
pelvic, or inguinal lymphadenopathy.

Reproductive: Prostate is unremarkable.

Other: Coarsely calcified at head of the ring mesenteric mass
measuring 5 x 4.5 cm centered within the upper anterior abdomen
appearing inseparable from the posterior wall of the mid transverse
colon. No intraperitoneal free fluid. No intraperitoneal free gas.
No organized fluid collection.

Musculoskeletal: Small volume right inguinal hernia containing fat.
Right the testis is noted within the inguinal canal.

No suspicious lytic or blastic osseous lesions. No acute displaced
fracture. Multilevel degenerative changes of the spine.
IMPRESSION: 1. A 5 x 4.5 cm calcified mesenteric mass that appears in several
from the transverse colon suggestive of carcinoid tumor.
2. Right testis within the right inguinal canal. Please correlate
with retractile versus less likely undescended testis.
3. Small volume fat containing right inguinal hernia.
4. Tiny hiatal hernia.
5. Scattered colonic diverticulosis with no acute diverticulitis.
6.  Aortic Atherosclerosis ([id number]-0J0.0).

ADDENDUM:
Please note findings other should state " Coarsely calcified
mesenteric mass measuring 5 x 4.5 cm center"

Please note impression should state "that appears inseparable from
the transverse colon in point #1."
In comparison to the 01/08/2017
study, the mass is not much changed in size but is more coarsely
calcified.

*** End of Addendum ***
FINDINGS: Lower chest: Coronary artery calcifications.  Tiny hiatal hernia.

Hepatobiliary: Punctate calcifications likely sequela prior
granulomatous disease. No focal liver abnormality. No gallstones,
gallbladder wall thickening, or pericholecystic fluid. No biliary
dilatation.

Pancreas: No focal lesion. Normal pancreatic contour. No surrounding
inflammatory changes. No main pancreatic ductal dilatation.

Spleen: Normal in size without focal abnormality.

Adrenals/Urinary Tract:

No right adrenal gland nodule. There is a slightly increased in size
1.5 cm left adrenal gland nodule with a density of 10 Hounsfield
units likely representing an adenoma.

Nonspecific bilateral perinephric stranding. No nephrolithiasis and
no hydronephrosis. No definite contour-deforming renal mass.

No ureterolithiasis or hydroureter.

The urinary bladder is unremarkable.

Stomach/Bowel: Stomach is within normal limits. No evidence of bowel
wall thickening or dilatation. Scattered colonic diverticulosis.
Appendix appears normal.

Vascular/Lymphatic: No abdominal aorta or iliac aneurysm. Severe
atherosclerotic plaque of the aorta and its branches. No abdominal,
pelvic, or inguinal lymphadenopathy.

Reproductive: Prostate is unremarkable.

Other: Coarsely calcified at head of the ring mesenteric mass
measuring 5 x 4.5 cm centered within the upper anterior abdomen
appearing inseparable from the posterior wall of the mid transverse
colon. No intraperitoneal free fluid. No intraperitoneal free gas.
No organized fluid collection.

Musculoskeletal: Small volume right inguinal hernia containing fat.
Right the testis is noted within the inguinal canal.

No suspicious lytic or blastic osseous lesions. No acute displaced
fracture. Multilevel degenerative changes of the spine.
IMPRESSION: 1. A 5 x 4.5 cm calcified mesenteric mass that appears in several
from the transverse colon suggestive of carcinoid tumor.
2. Right testis within the right inguinal canal. Please correlate
with retractile versus less likely undescended testis.
3. Small volume fat containing right inguinal hernia.
4. Tiny hiatal hernia.
5. Scattered colonic diverticulosis with no acute diverticulitis.
6.  Aortic Atherosclerosis ([id number]-0J0.0).

## 2023-01-27 ENCOUNTER — Other Ambulatory Visit: Payer: PPO

## 2023-02-03 ENCOUNTER — Other Ambulatory Visit: Payer: PPO

## 2023-02-07 ENCOUNTER — Ambulatory Visit: Payer: PPO | Admitting: Oncology

## 2023-02-10 ENCOUNTER — Ambulatory Visit: Payer: PPO | Admitting: Oncology

## 2023-02-17 ENCOUNTER — Ambulatory Visit: Payer: PPO | Admitting: Oncology
